# Patient Record
Sex: Female | Born: 1975 | Race: Black or African American | Hispanic: No | Marital: Single | Smoking: Former smoker
Health system: Southern US, Community
[De-identification: ages and names within clinical notes are randomized; demographics above are authoritative.]

## PROBLEM LIST (undated history)

## (undated) DIAGNOSIS — I10 Essential (primary) hypertension: Secondary | ICD-10-CM

## (undated) DIAGNOSIS — R0600 Dyspnea, unspecified: Secondary | ICD-10-CM

## (undated) HISTORY — PX: TRACHEOSTOMY: SUR1362

---

## 2015-01-23 DIAGNOSIS — I10 Essential (primary) hypertension: Secondary | ICD-10-CM | POA: Insufficient documentation

## 2015-01-23 DIAGNOSIS — G4733 Obstructive sleep apnea (adult) (pediatric): Secondary | ICD-10-CM | POA: Diagnosis present

## 2015-06-06 DIAGNOSIS — F1411 Cocaine abuse, in remission: Secondary | ICD-10-CM | POA: Insufficient documentation

## 2015-06-25 DIAGNOSIS — N3946 Mixed incontinence: Secondary | ICD-10-CM | POA: Insufficient documentation

## 2022-01-18 DIAGNOSIS — D649 Anemia, unspecified: Secondary | ICD-10-CM | POA: Insufficient documentation

## 2022-01-18 DIAGNOSIS — G894 Chronic pain syndrome: Secondary | ICD-10-CM | POA: Insufficient documentation

## 2022-03-28 ENCOUNTER — Emergency Department: Payer: Medicaid Other

## 2022-03-28 ENCOUNTER — Other Ambulatory Visit: Payer: Self-pay

## 2022-03-28 ENCOUNTER — Inpatient Hospital Stay
Admission: EM | Admit: 2022-03-28 | Discharge: 2022-03-30 | DRG: 871 | Disposition: A | Discharge status: Home / Self Care | Payer: Medicaid Other | Attending: Internal Medicine | Admitting: Internal Medicine

## 2022-03-28 DIAGNOSIS — Z930 Tracheostomy status: Secondary | ICD-10-CM | POA: Diagnosis not present

## 2022-03-28 DIAGNOSIS — Z9981 Dependence on supplemental oxygen: Secondary | ICD-10-CM

## 2022-03-28 DIAGNOSIS — E876 Hypokalemia: Secondary | ICD-10-CM | POA: Diagnosis not present

## 2022-03-28 DIAGNOSIS — E662 Morbid (severe) obesity with alveolar hypoventilation: Secondary | ICD-10-CM | POA: Diagnosis present

## 2022-03-28 DIAGNOSIS — Z9889 Other specified postprocedural states: Secondary | ICD-10-CM

## 2022-03-28 DIAGNOSIS — A419 Sepsis, unspecified organism: Secondary | ICD-10-CM | POA: Diagnosis present

## 2022-03-28 DIAGNOSIS — R6520 Severe sepsis without septic shock: Secondary | ICD-10-CM | POA: Diagnosis present

## 2022-03-28 DIAGNOSIS — I10 Essential (primary) hypertension: Secondary | ICD-10-CM | POA: Diagnosis present

## 2022-03-28 DIAGNOSIS — L03311 Cellulitis of abdominal wall: Secondary | ICD-10-CM | POA: Diagnosis present

## 2022-03-28 DIAGNOSIS — L0390 Cellulitis, unspecified: Secondary | ICD-10-CM | POA: Diagnosis not present

## 2022-03-28 DIAGNOSIS — Z20822 Contact with and (suspected) exposure to covid-19: Secondary | ICD-10-CM | POA: Diagnosis present

## 2022-03-28 DIAGNOSIS — J9621 Acute and chronic respiratory failure with hypoxia: Secondary | ICD-10-CM | POA: Diagnosis present

## 2022-03-28 DIAGNOSIS — Z6841 Body Mass Index (BMI) 40.0 and over, adult: Secondary | ICD-10-CM

## 2022-03-28 HISTORY — DX: Essential (primary) hypertension: I10

## 2022-03-28 HISTORY — DX: Dyspnea, unspecified: R06.00

## 2022-03-28 LAB — CBC WITH DIFFERENTIAL/PLATELET
Abs Immature Granulocytes: 0.06 10*3/uL (ref 0.00–0.07)
Basophils Absolute: 0 10*3/uL (ref 0.0–0.1)
Basophils Relative: 0 %
Eosinophils Absolute: 0 10*3/uL (ref 0.0–0.5)
Eosinophils Relative: 0 %
HCT: 39.6 % (ref 36.0–46.0)
Hemoglobin: 12.1 g/dL (ref 12.0–15.0)
Immature Granulocytes: 0 %
Lymphocytes Relative: 7 %
Lymphs Abs: 1.1 10*3/uL (ref 0.7–4.0)
MCH: 25.8 pg — ABNORMAL LOW (ref 26.0–34.0)
MCHC: 30.6 g/dL (ref 30.0–36.0)
MCV: 84.4 fL (ref 80.0–100.0)
Monocytes Absolute: 0.6 10*3/uL (ref 0.1–1.0)
Monocytes Relative: 4 %
Neutro Abs: 12.5 10*3/uL — ABNORMAL HIGH (ref 1.7–7.7)
Neutrophils Relative %: 89 %
Platelets: 209 10*3/uL (ref 150–400)
RBC: 4.69 MIL/uL (ref 3.87–5.11)
RDW: 15.7 % — ABNORMAL HIGH (ref 11.5–15.5)
WBC: 14.2 10*3/uL — ABNORMAL HIGH (ref 4.0–10.5)
nRBC: 0 % (ref 0.0–0.2)

## 2022-03-28 LAB — COMPREHENSIVE METABOLIC PANEL
ALT: 13 U/L (ref 0–44)
AST: 20 U/L (ref 15–41)
Albumin: 3.8 g/dL (ref 3.5–5.0)
Alkaline Phosphatase: 59 U/L (ref 38–126)
Anion gap: 8 (ref 5–15)
BUN: 15 mg/dL (ref 6–20)
CO2: 30 mmol/L (ref 22–32)
Calcium: 9.2 mg/dL (ref 8.9–10.3)
Chloride: 100 mmol/L (ref 98–111)
Creatinine, Ser: 1.03 mg/dL — ABNORMAL HIGH (ref 0.44–1.00)
GFR, Estimated: >60 mL/min (ref >60)
Glucose, Bld: 115 mg/dL — ABNORMAL HIGH (ref 70–99)
Potassium: 4.2 mmol/L (ref 3.5–5.1)
Sodium: 138 mmol/L (ref 135–145)
Total Bilirubin: 0.9 mg/dL (ref 0.3–1.2)
Total Protein: 7.5 g/dL (ref 6.5–8.1)

## 2022-03-28 LAB — URINALYSIS, COMPLETE (UACMP) WITH MICROSCOPIC
Bilirubin Urine: NEGATIVE
Glucose, UA: NEGATIVE mg/dL
Ketones, ur: NEGATIVE mg/dL
Leukocytes,Ua: NEGATIVE
Nitrite: NEGATIVE
Protein, ur: 100 mg/dL — AB
Specific Gravity, Urine: 1.013 (ref 1.005–1.030)
pH: 6 (ref 5.0–8.0)

## 2022-03-28 LAB — APTT: aPTT: 28 seconds (ref 24–36)

## 2022-03-28 LAB — LACTIC ACID, PLASMA: Lactic Acid, Venous: 1.6 mmol/L (ref 0.5–1.9)

## 2022-03-28 LAB — GLUCOSE, CAPILLARY: Glucose-Capillary: 131 mg/dL — ABNORMAL HIGH (ref 70–99)

## 2022-03-28 LAB — RESP PANEL BY RT-PCR (FLU A&B, COVID) ARPGX2
Influenza A by PCR: NEGATIVE
Influenza B by PCR: NEGATIVE
SARS Coronavirus 2 by RT PCR: NEGATIVE

## 2022-03-28 LAB — PROTIME-INR
INR: 1 (ref 0.8–1.2)
Prothrombin Time: 13.2 seconds (ref 11.4–15.2)

## 2022-03-28 LAB — POC URINE PREG, ED: Preg Test, Ur: NEGATIVE

## 2022-03-28 LAB — TSH: TSH: 1.448 u[IU]/mL (ref 0.350–4.500)

## 2022-03-28 MED ORDER — SODIUM CHLORIDE 0.9 % IV SOLN
2.0000 g | Freq: Once | INTRAVENOUS | Status: AC
  Administered 2022-03-28: 2 g via INTRAVENOUS
  Filled 2022-03-28: qty 12.5

## 2022-03-28 MED ORDER — ENOXAPARIN SODIUM 100 MG/ML IJ SOSY
85.0000 mg | PREFILLED_SYRINGE | INTRAMUSCULAR | Status: DC
  Administered 2022-03-28: 85 mg via SUBCUTANEOUS
  Filled 2022-03-28 (×3): qty 0.85

## 2022-03-28 MED ORDER — FENTANYL CITRATE PF 50 MCG/ML IJ SOSY
50.0000 ug | PREFILLED_SYRINGE | INTRAMUSCULAR | Status: DC | PRN
Start: 2022-03-28 — End: 2022-03-30
  Administered 2022-03-28: 50 ug via INTRAVENOUS
  Filled 2022-03-28: qty 1

## 2022-03-28 MED ORDER — IOHEXOL 350 MG/ML SOLN
100.0000 mL | Freq: Once | INTRAVENOUS | Status: AC | PRN
  Administered 2022-03-28: 100 mL via INTRAVENOUS

## 2022-03-28 MED ORDER — VANCOMYCIN HCL IN DEXTROSE 1-5 GM/200ML-% IV SOLN
1000.0000 mg | Freq: Once | INTRAVENOUS | Status: AC
  Administered 2022-03-28: 1000 mg via INTRAVENOUS
  Filled 2022-03-28: qty 200

## 2022-03-28 MED ORDER — ACETAMINOPHEN 650 MG RE SUPP: 650.0000 mg | Freq: Four times a day (QID) | RECTAL | Status: DC | PRN

## 2022-03-28 MED ORDER — ACETAMINOPHEN 325 MG PO TABS
650.0000 mg | ORAL_TABLET | Freq: Four times a day (QID) | ORAL | Status: DC | PRN
  Administered 2022-03-28: 650 mg via ORAL
  Filled 2022-03-28: qty 2

## 2022-03-28 MED ORDER — ONDANSETRON HCL 4 MG/2ML IJ SOLN: 4.0000 mg | Freq: Four times a day (QID) | INTRAMUSCULAR | Status: DC | PRN

## 2022-03-28 MED ORDER — VANCOMYCIN HCL 1500 MG/300ML IV SOLN
1500.0000 mg | Freq: Once | INTRAVENOUS | Status: AC
  Administered 2022-03-28: 1500 mg via INTRAVENOUS
  Filled 2022-03-28: qty 300

## 2022-03-28 MED ORDER — SODIUM CHLORIDE 0.9 % IV SOLN
2.0000 g | INTRAVENOUS | Status: DC
  Administered 2022-03-28 – 2022-03-29 (×2): 2 g via INTRAVENOUS
  Filled 2022-03-28 (×3): qty 20

## 2022-03-28 MED ORDER — ONDANSETRON HCL 4 MG PO TABS: 4.0000 mg | ORAL_TABLET | Freq: Four times a day (QID) | ORAL | Status: DC | PRN

## 2022-03-28 MED ORDER — SODIUM CHLORIDE 0.9 % IV SOLN
500.0000 mg | Freq: Once | INTRAVENOUS | Status: AC
  Administered 2022-03-28: 500 mg via INTRAVENOUS
  Filled 2022-03-28: qty 5

## 2022-03-28 MED ORDER — LACTATED RINGERS IV SOLN: INTRAVENOUS | Status: AC

## 2022-03-28 MED ORDER — OXYCODONE-ACETAMINOPHEN 5-325 MG PO TABS
2.0000 | ORAL_TABLET | Freq: Four times a day (QID) | ORAL | Status: DC | PRN
  Administered 2022-03-28 – 2022-03-30 (×6): 2 via ORAL
  Filled 2022-03-28 (×6): qty 2

## 2022-03-28 MED ORDER — HYDROCODONE-ACETAMINOPHEN 5-325 MG PO TABS: 1.0000 | ORAL_TABLET | ORAL | Status: DC | PRN

## 2022-03-28 MED ORDER — LACTATED RINGERS IV SOLN: INTRAVENOUS | Status: DC

## 2022-03-28 MED ORDER — DULOXETINE HCL 30 MG PO CPEP
30.0000 mg | ORAL_CAPSULE | Freq: Every day | ORAL | Status: DC
  Administered 2022-03-29 – 2022-03-30 (×2): 30 mg via ORAL
  Filled 2022-03-28 (×2): qty 1

## 2022-03-28 MED ORDER — LORAZEPAM 2 MG/ML IJ SOLN
0.5000 mg | Freq: Once | INTRAMUSCULAR | Status: AC
  Administered 2022-03-28: 0.5 mg via INTRAVENOUS
  Filled 2022-03-28: qty 1

## 2022-03-28 NOTE — Progress Notes (Signed)
45 yof FC with Shortness of Breath arrives on unit currently on 4L of Oxygen. Alert and oriented by 4. Vital Signs stable, except for Temperature at 101 F. Patient presents with labored breathing and expiratory wheezes. Trache in place ?

## 2022-03-28 NOTE — Assessment & Plan Note (Signed)
Patient presents to the ER for evaluation of shortness of breath and a fever with a Tmax of 103.   ?She is noted to be tachycardic and tachypneic with marked leukocytosis.  Lactic acid level is within normal limits. ?Patient is morbidly obese and has erythema over her pannus consistent with cellulitis ?We will place patient on IV Rocephin ?Follow-up results of blood cultures ? ?

## 2022-03-28 NOTE — Progress Notes (Signed)
CODE SEPSIS - PHARMACY COMMUNICATION ? ?**Broad Spectrum Antibiotics should be administered within 1 hour of Sepsis diagnosis** ? ?Time Code Sepsis Called/Page Received: 1104 ? ?Antibiotics Ordered: Cefepime + azithromycin ? ?Time of 1st antibiotic administration: 1119 ? ?Additional action taken by pharmacy: N/A ? ?Sandra Holloway ?03/28/2022  11:07 AM  ?

## 2022-03-28 NOTE — Assessment & Plan Note (Addendum)
Patient has a history of chronic respiratory failure and is on 3 L of oxygen continuous. ?She was noted to be hypoxic with pulse oximetry of 87% on 3 L and is currently on 5 L via trach collar. ?Worsening respiratory failure may be related to sepsis ?We will attempt to wean patient off oxygen as tolerated ?

## 2022-03-28 NOTE — ED Provider Notes (Signed)
? ?[facility] ?Provider Note ? ? ? Event Date/Time  ? First MD Initiated Contact with Patient 03/28/22 1102   ?  (approximate) ? ? ?History  ? ?Shortness of Breath ? ? ?HPI ? ?Sandra Holloway is a 46 y.o. female morbidly obese on 3 L O2 chronically with trach presents to the ER febrile week cough as well as pain in her abdomen with history of recurrent cellulitis.  No recent antibiotics denies any dysuria.  No flank pain.  No nausea vomiting or diarrhea.  Found to be hypoxic on her chronic O2. ?  ? ? ?Physical Exam  ? ?Triage Vital Signs: ?ED Triage Vitals  ?Enc Vitals Group  ?   BP   ?   Pulse   ?   Resp   ?   Temp   ?   Temp src   ?   SpO2   ?   Weight   ?   Height   ?   Head Circumference   ?   Peak Flow   ?   Pain Score   ?   Pain Loc   ?   Pain Edu?   ?   Excl. in [place]?   ? ? ?Most recent vital signs: ?Vitals:  ? 03/28/22 1226 03/28/22 1342  ?BP:  (!) 112/99  ?Pulse: (!) 103 (!) 103  ?Resp: (!) 36 (!) 23  ?Temp:    ?SpO2: 95% 97%  ? ? ? ?Constitutional: Alert, chronically ill appearing ?Eyes: Conjunctivae are normal.  ?Head: Atraumatic. ?Nose: No congestion/rhinnorhea. ?Mouth/Throat: Mucous membranes are moist.   ?Neck: Painless ROM.  ?Cardiovascular:   Good peripheral circulation. ?Respiratory: Normal respiratory effort.  No retractions.  ?Gastrointestinal: Soft, exam somewhat limited due to body habitus with suprapubic induration and erythema warmth no crepitus ?Musculoskeletal:  no deformity ?Neurologic:  MAE spontaneously. No gross focal neurologic deficits are appreciated.  ?Skin:  Skin is warm, dry and intact. ?Psychiatric: Mood and affect are normal. Speech and behavior are normal. ? ? ? ?ED Results / Procedures / Treatments  ? ?Labs ?(all labs ordered are listed, but only abnormal results are displayed) ?Labs Reviewed  ?COMPREHENSIVE METABOLIC PANEL - Abnormal; Notable for the following components:  ?    Result Value  ? Glucose, Bld 115 (*)   ? Creatinine, Ser 1.03 (*)   ? All other  components within normal limits  ?CBC WITH DIFFERENTIAL/PLATELET - Abnormal; Notable for the following components:  ? WBC 14.2 (*)   ? MCH 25.8 (*)   ? RDW 15.7 (*)   ? Neutro Abs 12.5 (*)   ? All other components within normal limits  ?URINALYSIS, COMPLETE (UACMP) WITH MICROSCOPIC - Abnormal; Notable for the following components:  ? Color, Urine STRAW (*)   ? APPearance CLEAR (*)   ? Hgb urine dipstick SMALL (*)   ? Protein, ur 100 (*)   ? Bacteria, UA RARE (*)   ? All other components within normal limits  ?RESP PANEL BY RT-PCR (FLU A&B, COVID) [id number]  ?CULTURE, BLOOD (ROUTINE X 2)  ?CULTURE, BLOOD (ROUTINE X 2)  ?URINE CULTURE  ?LACTIC ACID, PLASMA  ?PROTIME-INR  ?APTT  ?POC URINE PREG, ED  ? ? ? ?EKG ? ?ED ECG REPORT ?I, Merlyn Lot, the attending physician, personally viewed and interpreted this ECG. ? ? Date: 03/28/2022 ? EKG Time: 14:25 ? Rate: 95 ? Rhythm: sinus ? Axis: normal ? Intervals:normal ? ST&T Change: no stemi, no depressions ? ? ? ?RADIOLOGY ?Please  see ED Course for my review and interpretation. ? ?I personally reviewed all radiographic images ordered to evaluate for the above acute complaints and reviewed radiology reports and findings.  These findings were personally discussed with the patient.  Please see medical record for radiology report. ? ? ? ?PROCEDURES: ? ?Critical Care performed: Yes, see critical care procedure note(s) ? ?.Critical Care ?Performed by: Merlyn Lot, MD ?Authorized by: Merlyn Lot, MD  ? ?Critical care provider statement:  ?  Critical care time (minutes):  40 ?  Critical care was necessary to treat or prevent imminent or life-threatening deterioration of the following conditions:  Sepsis ?  Critical care was time spent personally by me on the following activities:  Ordering and performing treatments and interventions, ordering and review of laboratory studies, ordering and review of radiographic studies, pulse oximetry, re-evaluation of patient's  condition, review of old charts, obtaining history from patient or surrogate, examination of patient, evaluation of patient's response to treatment, discussions with primary provider, discussions with consultants and development of treatment plan with patient or surrogate ? ? ?MEDICATIONS ORDERED IN ED: ?Medications  ?lactated ringers infusion ( Intravenous New Bag/Given 03/28/22 1344)  ?vancomycin (VANCOCIN) IVPB 1000 mg/200 mL premix (has no administration in time range)  ?  Followed by  ?vancomycin (VANCOREADY) IVPB 1500 mg/300 mL (has no administration in time range)  ?fentaNYL (SUBLIMAZE) injection 50 mcg (has no administration in time range)  ?ceFEPIme (MAXIPIME) 2 g in sodium chloride 0.9 % 100 mL IVPB (0 g Intravenous Stopped 03/28/22 1215)  ?azithromycin (ZITHROMAX) 500 mg in sodium chloride 0.9 % 250 mL IVPB (0 mg Intravenous Stopped 03/28/22 1317)  ?iohexol (OMNIPAQUE) 350 MG/ML injection 100 mL (100 mLs Intravenous Contrast Given 03/28/22 1325)  ?LORazepam (ATIVAN) injection 0.5 mg (0.5 mg Intravenous Given 03/28/22 1222)  ? ? ? ?IMPRESSION / MDM / ASSESSMENT AND PLAN / ED COURSE  ?I reviewed the triage vital signs and the nursing notes. ?             ?               ? ?Differential diagnosis includes, but is not limited to, sepsis, pna, chf, cellulitis, appendicit,s uti, pyelo, abscess ? ?Patient presented to ER for evaluation of symptoms as described above.  Patient chronically ill-appearing with acute respiratory failure with hypoxia requiring supplemental oxygen.  Blood work sent off due to concern for sepsis in the differential.  CT imaging ordered as well as x-ray.  The patient will be placed on continuous pulse oximetry and telemetry for monitoring.  Laboratory evaluation will be sent to evaluate for the above complaints.   Order broad-spectrum antibiotics due to concern for sepsis. ? ? ? ?Clinical Course as of 03/28/22 1435  ?Sun Mar 28, 2022  ?1202 Take normal renal function within normal limits.  No  significant acidosis.  Does have leukocytosis with left shift consistent with sepsis.  We will hold off on aggressive IV fluid resuscitation his chest x-ray on my review and interpretation concerning for possible pulmonary edema in the setting of normal lactate and normotensive. [PR]  ?1219 Patient states that she is too scared to go to CT scan requesting something for anxiety.  She is saturating well at this time will order low-dose Ativan. [PR]  ?8 CT imaging does show findings consistent with cellulitis no other acute intra-abdominal process.  Given hypoxia fever tachycardia also suspicious for pneumonia.  She does have sepsis due to she will require hospitalization.  Will consult  hospitalist. [PR]  ?1424 Have consulted with hospitalist who agrees admit patient to their service [PR]  ?  ?Clinical Course User Index ?[PR] Merlyn Lot, MD  ? ? ? ?FINAL CLINICAL IMPRESSION(S) / ED DIAGNOSES  ? ?Final diagnoses:  ?Sepsis with acute hypoxic respiratory failure without septic shock, due to unspecified organism [facility])  ? ? ? ?Rx / DC Orders  ? ?ED Discharge Orders   ? ? None  ? ?  ? ? ? ?Note:  This document was prepared using Dragon voice recognition software and may include unintentional dictation errors. ? ?  ?Merlyn Lot, MD ?03/28/22 1436 ? ?

## 2022-03-28 NOTE — Progress Notes (Signed)
Patient complains of pain 8/10 in lower abdomen. 2 tabs of Percocet 5-325 administered. See MAR. Pain assessment completed. See Flow sheets.  ?

## 2022-03-28 NOTE — Consult Note (Signed)
PHARMACY -  BRIEF ANTIBIOTIC NOTE  ? ?Pharmacy has received consult(s) for cefepime from an ED provider. Patient is also ordered azithromycin. The patient's profile has been reviewed for ht/wt/allergies/indication/available labs.   ? ?One time order(s) placed for  ?--Cefepime 2 g IV ? ?Further antibiotics/pharmacy consults should be ordered by admitting physician if indicated.       ?                ?Thank you, ?Tressie Ellis ?03/28/2022  11:06 AM  ?

## 2022-03-28 NOTE — Progress Notes (Signed)
RT note: ? ?Patient has #6 Shiley XLT Distal Cuffless in place.  Independent with trach care at home and is on 3-4l baseline.  Denies trach needs and this time and refuses suctioning need.  Inner cannula changed at 0900hrs this morning. ? ?Patient states her trach is managed by [facility], and that routine trach changes are done in the OR. ? ?Placed on ATC at 28% for humidity and supplies placed at bedside.  Facility does not support #6 XLT distal, so a 6.0ETT will be placed at bedside for emergency use. ?

## 2022-03-28 NOTE — ED Notes (Signed)
Pt advised she is "scared" to go to CT. Will make MD aware.  ?

## 2022-03-28 NOTE — ED Notes (Signed)
Pt complaint of pain. MD made aware.  ? ?

## 2022-03-28 NOTE — Sepsis Progress Note (Signed)
Sepsis protocol is being followed by [facility]. 

## 2022-03-28 NOTE — ED Triage Notes (Signed)
BIB EMS from home. Called for SOB started yesterday. Chronic 3 liters at home. Sat 87% on 3 liters. EMS placed on 8 liters. ?Abdominal pain. HX of cullulitis,. ?100.8 ?156/121 ?96 ?Pt is a trach patient.  ?

## 2022-03-28 NOTE — Assessment & Plan Note (Signed)
Patient has a BMI of 69.50 kg/m2 ?Complicates overall prognosis and care ?Lifestyle modification and exercise has been discussed with patient in detail ?

## 2022-03-28 NOTE — H&P (Signed)
?History and Physical  ? ? ?Patient: Sandra Holloway MGQ:[id number] DOB: 1976-08-20 ?DOA: 03/28/2022 ?DOS: the patient was seen and examined on 03/28/2022 ?PCP: Pcp, No  ?Patient coming from: Home ? ?Chief Complaint:  ?Chief Complaint  ?Patient presents with  ? Shortness of Breath  ? ?HPI: Sandra Holloway is a 46 y.o. 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 ?Per EMS she had pulse oximetry of 87% on her 3 L and was placed on 8 L prior to transport to the ER.  She had a Tmax of 100.34F ?She denies having any increased secretions from her trach and denies having any cough.  She complains of abdominal pain mostly over the lower abdominal area but denies having any urinary frequency, no nocturia, no dysuria or hematuria. ?She denies having any chest pain, no nausea, no vomiting, no leg swelling, no dizziness, no lightheadedness, no headache, no blurred vision or any focal deficit. ? ?Review of Systems: As mentioned in the history of present illness. All other systems reviewed and are negative. ?Past Medical History:  ?Diagnosis Date  ? Dyspnea   ? Hypertension   ? ? ?Social History:  has no history on file for tobacco use, alcohol use, and drug use. ? ?Not on File ? ?No family history on file. ? ?Prior to Admission medications   ?Not on File  ? ? ?Physical Exam: ?Vitals:  ? 03/28/22 1226 03/28/22 1342 03/28/22 1430 03/28/22 1530  ?BP:  (!) 112/99 (!) 146/85 (!) 144/74  ?Pulse: (!) 103 (!) 103 95 93  ?Resp: (!) 36 (!) 23 (!) 34 (!) 28  ?Temp:      ?TempSrc:      ?SpO2: 95% 97% 93% 91%  ?Weight:      ?Height:      ? ?Physical Exam ?Vitals and nursing note reviewed.  ?Constitutional:   ?   Appearance: She is obese.   ?HENT:  ?   Head: Normocephalic and atraumatic.  ?   Mouth/Throat:  ?   Mouth: Mucous membranes are moist.  ?   Comments: Tracheostomy ?Eyes:  ?   Conjunctiva/sclera: Conjunctivae normal.  ?Cardiovascular:  ?   Rate and Rhythm: Tachycardia present.  ?Pulmonary:  ?   Comments: Tachypnea ?Abdominal:  ?   Comments: Central adiposity, tender over the suprapubic area, erythema over suprapubic area with differential warmth  ?Musculoskeletal:     ?   General: Normal range of motion.  ?   Cervical back: Normal range of motion and neck supple.  ?Skin: ?   General: Skin is warm and dry.  ?Neurological:  ?   General: No focal deficit present.  ?   Mental Status: She is alert and oriented to person, place, and time.  ?Psychiatric:     ?   Mood and Affect: Mood normal.     ?   Behavior: Behavior normal.  ? ? ?Data Reviewed: ?Relevant notes from primary care and specialist visits, past discharge summaries as available in EHR, including Care Everywhere. ?Prior diagnostic testing as pertinent to current admission diagnoses ?Updated medications and problem lists for reconciliation ?ED course, including vitals, labs, imaging, treatment and response to treatment ?Triage notes, nursing and pharmacy notes and ED provider's notes ?Notable results as noted in HPI ?  Labs reviewed.  White count 14.2, hemoglobin 12.1, hematocrit 39.6, MCV 84.4, platelet count 219, sodium 138, potassium 4.2, chloride 100, bicarb 30, glucose 115, BUN 15, creatinine 1.03, lactic acid 1.6, PT 13.2, INR 1.0 ?Urine analysis is sterile ?Respiratory viral panel is negative ?Chest x-ray reviewed by me shows Cardiomegaly with pulmonary vascular congestion and minimal ?bibasilar opacities - question atelectasis versus airspace disease/pneumonia. ?CT scan of abdomen and pelvis shows skin thickening and infiltrative changes in the subcutaneous fat at ?the inferior abdominal wall pannus in the pelvis question cellulitis. Small umbilical hernia containing fat. ?Tiny  nonobstructing LEFT renal calculus. Exophytic 15 mm diameter renal lesion, indeterminate attenuation; ?follow-up nonemergent renal ultrasound recommended to characterize. ?Twelve-lead EKG reviewed by me shows sinus rhythm with nonspecific T wave abnormalities. ?There are no new results to review at this time. ? ?Assessment and Plan: ?* Sepsis due to cellulitis [facility]) ?Patient presents to the ER for evaluation of shortness of breath and a fever with a Tmax of 103.   ?She is noted to be tachycardic and tachypneic with marked leukocytosis.  Lactic acid level is within normal limits. ?Patient is morbidly obese and has erythema over her pannus consistent with cellulitis ?We will place patient on IV Rocephin ?Follow-up results of blood cultures ? ? ?Acute on chronic respiratory failure with hypoxia (HCC) ?Patient has a history of chronic respiratory failure and is on 3 L of oxygen continuous. ?She was noted to be hypoxic with pulse oximetry of 87% on 3 L and is currently on 5 L via trach collar. ?Worsening respiratory failure may be related to sepsis ?We will attempt to wean patient off oxygen as tolerated ? ?Morbid obesity with BMI of 60.0-69.9, adult (HCC) ?Patient has a BMI of 69.50 kg/m2 ?Complicates overall prognosis and care ?Lifestyle modification and exercise has been discussed with patient in detail ? ?H/O tracheostomy ?Status post tracheostomy for obesity hypoventilation syndrome ?Trach care during this hospitalization ? ? ? ? ? Advance Care Planning:   Code Status: Full Code  ? ?Consults: None ? ?Family Communication: Greater than 50% of time was spent discussing patient's condition and plan of care with her at the bedside.  All questions and concerns have been addressed.  She verbalizes understanding and agrees with the plan. ? ?Severity of Illness: ?The appropriate patient status for this patient is INPATIENT. Inpatient status is judged to be reasonable and necessary in order to provide the required intensity  of service to ensure the patient's safety. The patient's presenting symptoms, physical exam findings, and initial radiographic and laboratory data in the context of their chronic comorbidities is felt to place them at high risk for further clinical deterioration. Furthermore, it is not anticipated that the patient will be medically stable for discharge from the hospital within 2 midnights of admission.  ? ?* I certify that at the point of admission it is my clinical judgment that the patient will require inpatient hospital care spanning beyond 2 midnights from the point of admission due to high intensity of service, high risk for further deterioration and high frequency of surveillance required.* ? ?Author: ?Lucile Shutters, MD ?03/28/2022 3:40 PM ? ?For on call review [email or web address].  ?

## 2022-03-28 NOTE — ED Notes (Signed)
Pt ambulated to toilet and back to bed. 

## 2022-03-28 NOTE — Assessment & Plan Note (Signed)
Status post tracheostomy for obesity hypoventilation syndrome ?Trach care during this hospitalization ?

## 2022-03-28 NOTE — Progress Notes (Signed)
Patient arrived at unit.

## 2022-03-28 NOTE — ED Notes (Signed)
Pt ambulated to toilet to urinate and back to bed.  ?

## 2022-03-29 ENCOUNTER — Encounter: Payer: Self-pay | Admitting: Internal Medicine

## 2022-03-29 DIAGNOSIS — Z6841 Body Mass Index (BMI) 40.0 and over, adult: Secondary | ICD-10-CM

## 2022-03-29 DIAGNOSIS — J9621 Acute and chronic respiratory failure with hypoxia: Secondary | ICD-10-CM

## 2022-03-29 LAB — BASIC METABOLIC PANEL
Anion gap: 9 (ref 5–15)
BUN: 13 mg/dL (ref 6–20)
CO2: 29 mmol/L (ref 22–32)
Calcium: 8.7 mg/dL — ABNORMAL LOW (ref 8.9–10.3)
Chloride: 100 mmol/L (ref 98–111)
Creatinine, Ser: 1 mg/dL (ref 0.44–1.00)
GFR, Estimated: >60 mL/min (ref >60)
Glucose, Bld: 106 mg/dL — ABNORMAL HIGH (ref 70–99)
Potassium: 3.4 mmol/L — ABNORMAL LOW (ref 3.5–5.1)
Sodium: 138 mmol/L (ref 135–145)

## 2022-03-29 LAB — CBC
HCT: 37.5 % (ref 36.0–46.0)
Hemoglobin: 11.5 g/dL — ABNORMAL LOW (ref 12.0–15.0)
MCH: 25.7 pg — ABNORMAL LOW (ref 26.0–34.0)
MCHC: 30.7 g/dL (ref 30.0–36.0)
MCV: 83.7 fL (ref 80.0–100.0)
Platelets: 192 10*3/uL (ref 150–400)
RBC: 4.48 MIL/uL (ref 3.87–5.11)
RDW: 15.9 % — ABNORMAL HIGH (ref 11.5–15.5)
WBC: 14.2 10*3/uL — ABNORMAL HIGH (ref 4.0–10.5)
nRBC: 0 % (ref 0.0–0.2)

## 2022-03-29 LAB — HIV ANTIBODY (ROUTINE TESTING W REFLEX): HIV Screen 4th Generation wRfx: NONREACTIVE

## 2022-03-29 MED ORDER — POTASSIUM CHLORIDE CRYS ER 20 MEQ PO TBCR
40.0000 meq | EXTENDED_RELEASE_TABLET | Freq: Once | ORAL | Status: AC
  Administered 2022-03-29: 40 meq via ORAL
  Filled 2022-03-29: qty 2

## 2022-03-29 NOTE — Progress Notes (Addendum)
Progress Note    Sandra Holloway  NGE:[id number] DOB: 09-17-1976  DOA: 03/28/2022 PCP: Pcp, No      Brief Narrative:    Medical records reviewed and are as summarized below:  Sandra Holloway is a 46 y.o. female with medical history significant for morbid obesity, obesity hypoventilation syndrome s/p tracheostomy, chronic hypoxic respiratory failure on 3 L of oxygen, hypertension, asthma, who presented to the hospital because of shortness of breath, oxygen desaturation to 87% on 3 L.  Reportedly, EMS had to put her on 8 L of oxygen prior to coming to the ED.  She was febrile with temperature of 100.8 F in the emergency room.   She was found to have sepsis secondary to lower abdominal wall/ pannus cellulitis.  She was treated with empiric IV antibiotics and IV fluids.      Assessment/Plan:   Principal Problem:   Sepsis due to cellulitis [facility]) Active Problems:   Acute on chronic respiratory failure with hypoxia (HCC)   Morbid obesity with BMI of 60.0-69.9, adult (HCC)   H/O tracheostomy    Body mass index is 69.5 kg/m.  (Morbid obesity)  Sepsis secondary to lower abdominal wall pannus cellulitis: Continue empiric IV Rocephin.  Follow-up blood cultures.  Acute on chronic hypoxic respiratory failure:  She's on 10 L/min oxygen. Taper down oxygen as able.  She uses 3 L at baseline.  Hypokalemia: Replete potassium and monitor levels  S/p tracheostomy for OHS  Diet Order             Diet Heart Room service appropriate? Yes; Fluid consistency: Thin  Diet effective now                            Consultants: None  Procedures: None    Medications:    DULoxetine  30 mg Oral Daily   enoxaparin (LOVENOX) injection  85 mg Subcutaneous Q24H   Continuous Infusions:  cefTRIAXone (ROCEPHIN)  IV Stopped (03/28/22 2319)     Anti-infectives (From admission, onward)    Start     Dose/Rate Route Frequency Ordered Stop   03/28/22 2200  cefTRIAXone  (ROCEPHIN) 2 g in sodium chloride 0.9 % 100 mL IVPB        2 g 200 mL/hr over 30 Minutes Intravenous Every 24 hours 03/28/22 1458 04/04/22 2159   03/28/22 1630  vancomycin (VANCOREADY) IVPB 1500 mg/300 mL       See Hyperspace for full Linked Orders Report.   1,500 mg 150 mL/hr over 120 Minutes Intravenous  Once 03/28/22 1409 03/28/22 2014   03/28/22 1500  vancomycin (VANCOCIN) IVPB 1000 mg/200 mL premix       See Hyperspace for full Linked Orders Report.   1,000 mg 200 mL/hr over 60 Minutes Intravenous  Once 03/28/22 1409 03/28/22 1613   03/28/22 1115  ceFEPIme (MAXIPIME) 2 g in sodium chloride 0.9 % 100 mL IVPB        2 g 200 mL/hr over 30 Minutes Intravenous  Once 03/28/22 1104 03/28/22 1215   03/28/22 1115  azithromycin (ZITHROMAX) 500 mg in sodium chloride 0.9 % 250 mL IVPB        500 mg 250 mL/hr over 60 Minutes Intravenous  Once 03/28/22 1104 03/28/22 1317              Family Communication/Anticipated D/C date and plan/Code Status   DVT prophylaxis:      Code Status: Full Code  Family  Communication: None Disposition Plan: Plan to discharge home in 1 to 2 days   Status is: Inpatient Remains inpatient appropriate because: IV antibiotics for lower abdominal cellulitis       Subjective:   Interval events noted.  She complains of lower abdominal pain  Objective:    Vitals:   03/29/22 0830 03/29/22 0835 03/29/22 0847 03/29/22 1135  BP:   (!) 166/86 140/75  Pulse:   79 76  Resp:   18 18  Temp:   99 F (37.2 C) 98.6 F (37 C)  TempSrc:      SpO2: (!) 74% 94% 94% 98%  Weight:      Height:       No data found.   Intake/Output Summary (Last 24 hours) at 03/29/2022 1321 Last data filed at 03/29/2022 0604 Gross per 24 hour  Intake 400 ml  Output --  Net 400 ml   Filed Weights   03/28/22 1104  Weight: (!) 172.4 kg    Exam:  GEN: NAD SKIN: Warm and dry EYES: EOMI ENT: MMM, tracheostomy CV: RRR PULM: CTA B ABD: soft, obese with large pannus,  tenderness across the lower abdomen, no rebound tenderness or guarding, +BS CNS: AAO x 3, non focal EXT: No edema or tenderness        Data Reviewed:   I have personally reviewed following labs and imaging studies:  Labs: Labs show the following:   Basic Metabolic Panel: Recent Labs  Lab 03/28/22 1109 03/29/22 0410  NA 138 138  K 4.2 3.4*  CL 100 100  CO2 30 29  GLUCOSE 115* 106*  BUN 15 13  CREATININE 1.03* 1.00  CALCIUM 9.2 8.7*   GFR Estimated Creatinine Clearance: 111 mL/min (by C-G formula based on SCr of 1 mg/dL). Liver Function Tests: Recent Labs  Lab 03/28/22 1109  AST 20  ALT 13  ALKPHOS 59  BILITOT 0.9  PROT 7.5  ALBUMIN 3.8   No results for input(s): LIPASE, AMYLASE in the last 168 hours. No results for input(s): AMMONIA in the last 168 hours. Coagulation profile Recent Labs  Lab 03/28/22 1109  INR 1.0    CBC: Recent Labs  Lab 03/28/22 1109 03/29/22 0410  WBC 14.2* 14.2*  NEUTROABS 12.5*  --   HGB 12.1 11.5*  HCT 39.6 37.5  MCV 84.4 83.7  PLT 209 192   Cardiac Enzymes: No results for input(s): CKTOTAL, CKMB, CKMBINDEX, TROPONINI in the last 168 hours. BNP (last 3 results) No results for input(s): PROBNP in the last 8760 hours. CBG: Recent Labs  Lab 03/28/22 2244  GLUCAP 131*   D-Dimer: No results for input(s): DDIMER in the last 72 hours. Hgb A1c: No results for input(s): HGBA1C in the last 72 hours. Lipid Profile: No results for input(s): CHOL, HDL, LDLCALC, TRIG, CHOLHDL, LDLDIRECT in the last 72 hours. Thyroid function studies: Recent Labs    03/28/22 1109  TSH 1.448   Anemia work up: No results for input(s): VITAMINB12, FOLATE, FERRITIN, TIBC, IRON, RETICCTPCT in the last 72 hours. Sepsis Labs: Recent Labs  Lab 03/28/22 1109 03/29/22 0410  WBC 14.2* 14.2*  LATICACIDVEN 1.6  --     Microbiology Recent Results (from the past 240 hour(s))  Resp Panel by RT-PCR (Flu A&B, Covid) Nasopharyngeal Swab      Status: None   Collection Time: 03/28/22 11:09 AM   Specimen: Nasopharyngeal Swab; Nasopharyngeal(NP) swabs in vial transport medium  Result Value Ref Range Status   SARS Coronavirus 2 by RT  PCR NEGATIVE NEGATIVE Final    Comment: (NOTE) SARS-CoV-2 target nucleic acids are NOT DETECTED.  The SARS-CoV-2 RNA is generally detectable in upper respiratory specimens during the acute phase of infection. The lowest concentration of SARS-CoV-2 viral copies this assay can detect is 138 copies/mL. A negative result does not preclude SARS-Cov-2 infection and should not be used as the sole basis for treatment or other patient management decisions. A negative result may occur with  improper specimen collection/handling, submission of specimen other than nasopharyngeal swab, presence of viral mutation(s) within the areas targeted by this assay, and inadequate number of viral copies(<138 copies/mL). A negative result must be combined with clinical observations, patient history, and epidemiological information. The expected result is Negative.  Fact Sheet for Patients:  BloggerCourse.com  Fact Sheet for Healthcare Providers:  SeriousBroker.it  This test is no t yet approved or cleared by the [place] FDA and  has been authorized for detection and/or diagnosis of SARS-CoV-2 by FDA under an Emergency Use Authorization (EUA). This EUA will remain  in effect (meaning this test can be used) for the duration of the COVID-19 declaration under Section 564(b)(1) of the Act, 21 U.S.C.section 360bbb-3(b)(1), unless the authorization is terminated  or revoked sooner.       Influenza A by PCR NEGATIVE NEGATIVE Final   Influenza B by PCR NEGATIVE NEGATIVE Final    Comment: (NOTE) The Xpert Xpress SARS-CoV-2/FLU/RSV plus assay is intended as an aid in the diagnosis of influenza from Nasopharyngeal swab specimens and should not be used as a sole basis  for treatment. Nasal washings and aspirates are unacceptable for Xpert Xpress SARS-CoV-2/FLU/RSV testing.  Fact Sheet for Patients: BloggerCourse.com  Fact Sheet for Healthcare Providers: SeriousBroker.it  This test is not yet approved or cleared by the [place] FDA and has been authorized for detection and/or diagnosis of SARS-CoV-2 by FDA under an Emergency Use Authorization (EUA). This EUA will remain in effect (meaning this test can be used) for the duration of the COVID-19 declaration under Section 564(b)(1) of the Act, 21 U.S.C. section 360bbb-3(b)(1), unless the authorization is terminated or revoked.  Performed at [facility], [address] [place], [place] [address]   Blood Culture (routine x 2)     Status: None (Preliminary result)   Collection Time: 03/28/22 11:09 AM   Specimen: BLOOD  Result Value Ref Range Status   Specimen Description BLOOD LEFT ANTECUBITAL  Final   Special Requests   Final    BOTTLES DRAWN AEROBIC AND ANAEROBIC Blood Culture adequate volume   Culture   Final    NO GROWTH < 24 HOURS Performed at [facility], [address], [place], [place] [address]    Report Status PENDING  Incomplete  Blood Culture (routine x 2)     Status: None (Preliminary result)   Collection Time: 03/28/22 11:09 AM   Specimen: BLOOD  Result Value Ref Range Status   Specimen Description BLOOD RIGHT ANTECUBITAL  Final   Special Requests   Final    BOTTLES DRAWN AEROBIC AND ANAEROBIC Blood Culture results may not be optimal due to an inadequate volume of blood received in culture bottles   Culture   Final    NO GROWTH < 24 HOURS Performed at [facility], [address], [place], [place] [address]    Report Status PENDING  Incomplete    Procedures and diagnostic studies:  CT CHEST WO CONTRAST  Result Date: 03/28/2022 CLINICAL DATA:  Pneumonia, suspected complication EXAM:  CT  CHEST WITHOUT CONTRAST TECHNIQUE: Multidetector CT imaging of the chest was performed following the standard protocol without IV contrast. RADIATION DOSE REDUCTION: This exam was performed according to the departmental dose-optimization program which includes automated exposure control, adjustment of the mA and/or kV according to patient size and/or use of iterative reconstruction technique. COMPARISON:  Chest radiograph 03/28/2022 FINDINGS: Cardiovascular: Upper normal size of cardiac chambers. Atherosclerotic calcifications aorta. Trace pericardial fluid. Aorta normal caliber. Mediastinum/Nodes: Tracheostomy tube within trachea above carina. Esophagus unremarkable. Base of cervical region normal appearance. Scattered normal size mediastinal lymph nodes. No thoracic adenopathy. Lungs/Pleura: Dependent opacities in the lower lobes favoring atelectasis. No definite infiltrate, pleural effusion, or pneumothorax. Upper Abdomen: Excreted contrast material within renal collecting systems. Indeterminate exophytic lesion posterior RIGHT kidney is noted on CT abdomen same date remaining visualized upper abdomen unremarkable. Musculoskeletal: No acute osseous findings. IMPRESSION: Bibasilar atelectasis. No other significant intrathoracic abnormalities. Electronically Signed   By: Ulyses Southward M.D.   On: 03/28/2022 15:28   CT ABDOMEN PELVIS W CONTRAST  Result Date: 03/28/2022 CLINICAL DATA:  Sepsis, shortness of breath since yesterday, abdominal pain, history of cellulitis EXAM: CT ABDOMEN AND PELVIS WITH CONTRAST TECHNIQUE: Multidetector CT imaging of the abdomen and pelvis was performed using the standard protocol following bolus administration of intravenous contrast. RADIATION DOSE REDUCTION: This exam was performed according to the departmental dose-optimization program which includes automated exposure control, adjustment of the mA and/or kV according to patient size and/or use of iterative reconstruction technique.  CONTRAST:  OMNIPAQUE IOHEXOL 350 MG/ML SOLN IV. No oral contrast. COMPARISON:  None FINDINGS: Lower chest: Bibasilar atelectasis Hepatobiliary: Gallbladder and liver normal appearance Pancreas: Normal appearance Spleen: Normal appearance Adrenals/Urinary Tract: Adrenal glands normal appearance. Tiny nonobstructing calculus at inferior pole LEFT kidney. Exophytic renal lesion 15 mm diameter, indeterminate attenuation. Kidneys, ureters, and bladder otherwise normal appearance. Stomach/Bowel: Normal appendix. Stomach and bowel loops normal appearance. Vascular/Lymphatic: Aorta normal caliber. Atherosclerotic calcifications in iliac arteries. No adenopathy. Reproductive: Unremarkable uterus and ovaries Other: No free air or free fluid. No acute inflammatory process. Small umbilical hernia containing fat. Musculoskeletal: Osseous structures unremarkable. Skin thickening and infiltrative changes in the subcutaneous fat at the inferior abdominal wall pannus in the pelvis question cellulitis. IMPRESSION: Skin thickening and infiltrative changes in the subcutaneous fat at the inferior abdominal wall pannus in the pelvis question cellulitis. Small umbilical hernia containing fat. Tiny nonobstructing LEFT renal calculus. Exophytic 15 mm diameter renal lesion, indeterminate attenuation; follow-up nonemergent renal ultrasound recommended to characterize. Electronically Signed   By: Ulyses Southward M.D.   On: 03/28/2022 13:50   DG Chest Port 1 View  Result Date: 03/28/2022 CLINICAL DATA:  Shortness of breath and sepsis. EXAM: PORTABLE CHEST 1 VIEW COMPARISON:  None. FINDINGS: A tracheostomy tube is identified. Cardiomegaly with pulmonary vascular congestion noted. Minimal bibasilar opacities are identified. No pneumothorax, pleural effusion or acute bony abnormalities are noted. IMPRESSION: Cardiomegaly with pulmonary vascular congestion and minimal bibasilar opacities - question atelectasis versus airspace  disease/pneumonia. Electronically Signed   By: Harmon Pier M.D.   On: 03/28/2022 11:48               LOS: 1 day   Sandra Holloway  [facility]   Pager on [email or web address]. If 7PM-7AM, please contact night-coverage at [email or web address]     03/29/2022, 1:21 PM

## 2022-03-29 NOTE — Progress Notes (Signed)
Mobility Specialist - Progress Note ? ? ? 03/29/22 1344  ?Mobility  ?Activity Refused mobility  ? ? ?Pt sitting EOB upon arrival. Pt refuses session despite max encouragement. No reason specified. ? ?Clarisa Schools ?Mobility Specialist ?03/29/22, 1:52 PM ? ? ? ?

## 2022-03-29 NOTE — Progress Notes (Signed)
Patient is a 69 yof FC who comes to the unit, alert and oriented by 4, Vital signs stable, currently on 4L of Oxygen. Patient has a tracheostomy collar on. NSR on the monitor. She is morbidly obese. ? ?General assessment shows a distended abdomen, and tenderness to the Right Lower Quadrant. The skin here appears mottled, and is discolored. ? ?20 G Peripheral IV present in the Left antecubital. ? ?Generalized edema present. ?

## 2022-03-29 NOTE — Progress Notes (Signed)
Patient's temperature on arrival to the unit was 99.5. Current temp is 98.1 ?

## 2022-03-29 NOTE — Plan of Care (Signed)
?  Problem: Skin Integrity: ?Goal: Skin integrity will improve ?Outcome: Progressing ? ?Problem: Nutrition: ?Goal: Adequate nutrition will be maintained ?Outcome: Progressing ?  ?Problem: Pain Managment: ?Goal: General experience of comfort will improve ?Outcome: Progressing ?  ?Problem: Clinical Measurements: ?Goal: Respiratory complications will improve ?Outcome: Progressing ?  ?Problem: Clinical Measurements: ?Goal: Will remain free from infection ?Outcome: Progressing ?  ?  ?

## 2022-03-30 LAB — BASIC METABOLIC PANEL
Anion gap: 6 (ref 5–15)
BUN: 13 mg/dL (ref 6–20)
CO2: 29 mmol/L (ref 22–32)
Calcium: 9 mg/dL (ref 8.9–10.3)
Chloride: 104 mmol/L (ref 98–111)
Creatinine, Ser: 0.85 mg/dL (ref 0.44–1.00)
GFR, Estimated: >60 mL/min (ref >60)
Glucose, Bld: 133 mg/dL — ABNORMAL HIGH (ref 70–99)
Potassium: 4.4 mmol/L (ref 3.5–5.1)
Sodium: 139 mmol/L (ref 135–145)

## 2022-03-30 LAB — CBC
HCT: 38.2 % (ref 36.0–46.0)
Hemoglobin: 11.5 g/dL — ABNORMAL LOW (ref 12.0–15.0)
MCH: 25.7 pg — ABNORMAL LOW (ref 26.0–34.0)
MCHC: 30.1 g/dL (ref 30.0–36.0)
MCV: 85.3 fL (ref 80.0–100.0)
Platelets: 201 10*3/uL (ref 150–400)
RBC: 4.48 MIL/uL (ref 3.87–5.11)
RDW: 15.8 % — ABNORMAL HIGH (ref 11.5–15.5)
WBC: 9.5 10*3/uL (ref 4.0–10.5)
nRBC: 0 % (ref 0.0–0.2)

## 2022-03-30 LAB — URINE CULTURE

## 2022-03-30 MED ORDER — CEFADROXIL 500 MG PO CAPS
500.0000 mg | ORAL_CAPSULE | Freq: Two times a day (BID) | ORAL | 0 refills | Status: DC
Start: 2022-03-30 — End: 2022-10-21

## 2022-03-30 MED ORDER — ACETAMINOPHEN 325 MG PO TABS
650.0000 mg | ORAL_TABLET | Freq: Four times a day (QID) | ORAL | Status: AC | PRN
Start: 2022-03-30 — End: ?

## 2022-03-30 NOTE — Discharge Summary (Signed)
?Physician Discharge Summary ?  ?Patient: Sandra Holloway MRN: [id number] DOB: 1975-12-17  ?Admit date:     03/28/2022  ?Discharge date: 03/30/2022  ?Discharge Physician: Lurene Shadow  ? ?PCP: Pcp, No  ? ?Recommendations at discharge:  ? ? ?Follow-up with PCP in 1 week ? ?Discharge Diagnoses: ?Principal Problem: ?  Sepsis due to cellulitis [facility]) ?Active Problems: ?  Acute on chronic respiratory failure with hypoxia (HCC) ?  Morbid obesity with BMI of 60.0-69.9, adult (HCC) ?  H/O tracheostomy ? ?Resolved Problems: ?  * No resolved hospital problems. * ? ?Hospital Course: ? ?Ms. Sandra Holloway is a 46 y.o. female with medical history significant for morbid obesity, obesity hypoventilation syndrome s/p tracheostomy, chronic hypoxic respiratory failure on 3 L of oxygen, hypertension, asthma, who presented to the hospital because of shortness of breath, oxygen desaturation to 87% on 3 L.  Reportedly, EMS had to put her on 8 L of oxygen prior to coming to the ED.  She was febrile with temperature of 100.8 ?F in the emergency room. ?  ?  ?She was found to have sepsis secondary to lower abdominal wall/ pannus cellulitis.  She was treated with empiric IV antibiotics and IV fluids.  She was also treated with oxygen via trach collar.  She said she is on 3 to 4 L/min oxygen via trach collar but she only uses it with activity.  However, it was discovered that patient will need oxygen even at rest.  She has been advised to use 4 L/min oxygen around-the-clock.  She is feeling better and she wants to go home today on oral antibiotics. ? ? ? ? ? ? ?  ? ?Pain control - [employer] Controlled Substance Reporting System database was reviewed. and patient was instructed, not to drive, operate heavy machinery, perform activities at heights, swimming or participation in water activities or provide baby-sitting services while on Pain, Sleep and Anxiety Medications; until their outpatient Physician has advised to do so again. Also  recommended to not to take more than prescribed Pain, Sleep and Anxiety Medications.  ?Consultants: None ?Procedures performed: None ?Disposition: Home ?Diet recommendation:  ?Discharge Diet Orders (From admission, onward)  ? ?  Start     Ordered  ? 03/30/22 0000  Diet - low sodium heart healthy       ? 03/30/22 1511  ? ?  ?  ? ?  ? ?Cardiac diet ?DISCHARGE MEDICATION: ?Allergies as of 03/30/2022   ?No Known Allergies ?  ? ?  ?Medication List  ?  ? ?TAKE these medications   ? ?acetaminophen 325 MG tablet ?Commonly known as: TYLENOL ?Take 2 tablets (650 mg total) by mouth every 6 (six) hours as needed for mild pain (or Fever >/= 101). ?  ?cefadroxil 500 MG capsule ?Commonly known as: DURICEF ?Take 1 capsule (500 mg total) by mouth 2 (two) times daily. ?  ?DULoxetine 30 MG capsule ?Commonly known as: CYMBALTA ?Take 30 mg by mouth daily. ?  ?oxyCODONE-acetaminophen 10-325 MG tablet ?Commonly known as: PERCOCET ?Take 1 tablet by mouth every 6 (six) hours. ?  ? ?  ? ?  ?  ? ? ?  ?Durable Medical Equipment  ?(From admission, onward)  ?  ? ? ?  ? ?  Start     Ordered  ? 03/30/22 1512  DME Oxygen  Once       ?Question Answer Comment  ?Length of Need Lifetime   ?Mode or (Route) Mask   ?Liters per Minute 4   ?  Frequency Continuous (stationary and portable oxygen unit needed)   ?Oxygen conserving device Yes   ?Oxygen delivery system Gas   ?  ? 03/30/22 1511  ? ?  ?  ? ?  ? ? ?Discharge Exam: ?Filed Weights  ? 03/28/22 1104  ?Weight: (!) 172.4 kg  ? ?GEN: NAD ?SKIN: Warm and dry ?EYES: No pallor or icterus ?ENT: MMM ?CV: RRR ?PULM: CTA B ?ABD: soft, obese with large pannus, some induration and tenderness noted along the pannus in the lower abdomen, no rebound tenderness or guarding, +BS ?CNS: AAO x 3, non focal ?EXT: No edema or tenderness ? ? ?Condition at discharge: good ? ?The results of significant diagnostics from this hospitalization (including imaging, microbiology, ancillary and laboratory) are listed below for reference.   ? ?Imaging Studies: ?CT CHEST WO CONTRAST ? ?Result Date: 03/28/2022 ?CLINICAL DATA:  Pneumonia, suspected complication EXAM: CT CHEST WITHOUT CONTRAST TECHNIQUE: Multidetector CT imaging of the chest was performed following the standard protocol without IV contrast. RADIATION DOSE REDUCTION: This exam was performed according to the departmental dose-optimization program which includes automated exposure control, adjustment of the mA and/or kV according to patient size and/or use of iterative reconstruction technique. COMPARISON:  Chest radiograph 03/28/2022 FINDINGS: Cardiovascular: Upper normal size of cardiac chambers. Atherosclerotic calcifications aorta. Trace pericardial fluid. Aorta normal caliber. Mediastinum/Nodes: Tracheostomy tube within trachea above carina. Esophagus unremarkable. Base of cervical region normal appearance. Scattered normal size mediastinal lymph nodes. No thoracic adenopathy. Lungs/Pleura: Dependent opacities in the lower lobes favoring atelectasis. No definite infiltrate, pleural effusion, or pneumothorax. Upper Abdomen: Excreted contrast material within renal collecting systems. Indeterminate exophytic lesion posterior RIGHT kidney is noted on CT abdomen same date remaining visualized upper abdomen unremarkable. Musculoskeletal: No acute osseous findings. IMPRESSION: Bibasilar atelectasis. No other significant intrathoracic abnormalities. Electronically Signed   By: Ulyses SouthwardMark  Boles M.D.   On: 03/28/2022 15:28  ? ?CT ABDOMEN PELVIS W CONTRAST ? ?Result Date: 03/28/2022 ?CLINICAL DATA:  Sepsis, shortness of breath since yesterday, abdominal pain, history of cellulitis EXAM: CT ABDOMEN AND PELVIS WITH CONTRAST TECHNIQUE: Multidetector CT imaging of the abdomen and pelvis was performed using the standard protocol following bolus administration of intravenous contrast. RADIATION DOSE REDUCTION: This exam was performed according to the departmental dose-optimization program which includes  automated exposure control, adjustment of the mA and/or kV according to patient size and/or use of iterative reconstruction technique. CONTRAST:  100mL OMNIPAQUE IOHEXOL 350 MG/ML SOLN IV. No oral contrast. COMPARISON:  None FINDINGS: Lower chest: Bibasilar atelectasis Hepatobiliary: Gallbladder and liver normal appearance Pancreas: Normal appearance Spleen: Normal appearance Adrenals/Urinary Tract: Adrenal glands normal appearance. Tiny nonobstructing calculus at inferior pole LEFT kidney. Exophytic renal lesion 15 mm diameter, indeterminate attenuation. Kidneys, ureters, and bladder otherwise normal appearance. Stomach/Bowel: Normal appendix. Stomach and bowel loops normal appearance. Vascular/Lymphatic: Aorta normal caliber. Atherosclerotic calcifications in iliac arteries. No adenopathy. Reproductive: Unremarkable uterus and ovaries Other: No free air or free fluid. No acute inflammatory process. Small umbilical hernia containing fat. Musculoskeletal: Osseous structures unremarkable. Skin thickening and infiltrative changes in the subcutaneous fat at the inferior abdominal wall pannus in the pelvis question cellulitis. IMPRESSION: Skin thickening and infiltrative changes in the subcutaneous fat at the inferior abdominal wall pannus in the pelvis question cellulitis. Small umbilical hernia containing fat. Tiny nonobstructing LEFT renal calculus. Exophytic 15 mm diameter renal lesion, indeterminate attenuation; follow-up nonemergent renal ultrasound recommended to characterize. Electronically Signed   By: Ulyses SouthwardMark  Boles M.D.   On: 03/28/2022 13:50  ? ?DG Chest  Port 1 View ? ?Result Date: 03/28/2022 ?CLINICAL DATA:  Shortness of breath and sepsis. EXAM: PORTABLE CHEST 1 VIEW COMPARISON:  None. FINDINGS: A tracheostomy tube is identified. Cardiomegaly with pulmonary vascular congestion noted. Minimal bibasilar opacities are identified. No pneumothorax, pleural effusion or acute bony abnormalities are noted. IMPRESSION:  Cardiomegaly with pulmonary vascular congestion and minimal bibasilar opacities - question atelectasis versus airspace disease/pneumonia. Electronically Signed   By: Harmon Pier M.D.   On: 03/28/2022 11:48   ? ?Micr

## 2022-03-30 NOTE — TOC Initial Note (Signed)
Transition of Care (TOC) - Initial/Assessment Note  ? ? ?Patient Details  ?Name: Sandra Holloway ?MRN: [id number] ?Date of Birth: 09-23-1976 ? ?Transition of Care (TOC) CM/SW Contact:    ?Gildardo Griffes, LCSW ?Phone Number: ?03/30/2022, 2:23 PM ? ?Clinical Narrative:                 ? ?CSW notes patient gets trach supplies from [facility] and self manages, per patient report she gets home O2 through [employer] and is interested in portable tank. CSW has informed Ian Malkin with [employer]  He reports he will order and deliver to room.  ? ?No further needs identified.  ? ? ?Expected Discharge Plan: Home/Self Care ?Barriers to Discharge: Continued Medical Work up ? ? ?Patient Goals and CMS Choice ?Patient states their goals for this hospitalization and ongoing recovery are:: to go home ?CMS Medicare.gov Compare Post Acute Care list provided to:: Patient ?Choice offered to / list presented to : Patient ? ?Expected Discharge Plan and Services ?Expected Discharge Plan: Home/Self Care ?  ?  ?  ?Living arrangements for the past 2 months: Single Family Home ?                ?  ?DME Agency: [employer] ?Date DME Agency Contacted: 03/30/22 ?  ?  ?  ?  ?  ?  ?  ? ?Prior Living Arrangements/Services ?Living arrangements for the past 2 months: Single Family Home ?Lives with:: Self ?  ?       ?  ?  ?  ?  ? ?Activities of Daily Living ?  ?  ? ?Permission Sought/Granted ?  ?  ?   ?   ?   ?   ? ?Emotional Assessment ?  ?  ?  ?Orientation: : Oriented to Self, Oriented to Place, Oriented to  Time, Oriented to Situation ?Alcohol / Substance Use: Not Applicable ?Psych Involvement: No (comment) ? ?Admission diagnosis:  Sepsis (HCC) [A41.9] ?Sepsis with acute hypoxic respiratory failure without septic shock, due to unspecified organism (HCC) [A41.9, R65.20, J96.01] ?Patient Active Problem List  ? Diagnosis Date Noted  ? Sepsis due to cellulitis (HCC) 03/28/2022  ? Morbid obesity with BMI of 60.0-69.9, adult (HCC) 03/28/2022  ? Acute on chronic respiratory failure  with hypoxia (HCC) 03/28/2022  ? H/O tracheostomy 03/28/2022  ? ?PCP:  Pcp, No ?Pharmacy:   ?[facility] - [place], [place] - [address] [place] ?[address] [place] [address] ?Phone: [phone] Fax: [phone] ? ? ? ? ?Social Determinants of Health (SDOH) Interventions ?  ? ?Readmission Risk Interventions ?   ? View : No data to display.  ?  ?  ?  ? ? ? ?

## 2022-04-02 LAB — CULTURE, BLOOD (ROUTINE X 2)
Culture: NO GROWTH
Culture: NO GROWTH
Special Requests: ADEQUATE

## 2022-05-31 ENCOUNTER — Emergency Department: Payer: Medicaid Other

## 2022-05-31 ENCOUNTER — Encounter: Payer: Self-pay | Admitting: Family Medicine

## 2022-05-31 ENCOUNTER — Other Ambulatory Visit: Payer: Self-pay

## 2022-05-31 ENCOUNTER — Observation Stay
Admission: EM | Admit: 2022-05-31 | Discharge: 2022-05-31 | Disposition: A | Discharge status: Home / Self Care | Payer: Medicaid Other | Attending: Family Medicine | Admitting: Family Medicine

## 2022-05-31 DIAGNOSIS — A419 Sepsis, unspecified organism: Secondary | ICD-10-CM | POA: Diagnosis present

## 2022-05-31 DIAGNOSIS — J45901 Unspecified asthma with (acute) exacerbation: Secondary | ICD-10-CM | POA: Diagnosis present

## 2022-05-31 DIAGNOSIS — Z930 Tracheostomy status: Secondary | ICD-10-CM | POA: Insufficient documentation

## 2022-05-31 DIAGNOSIS — Z20822 Contact with and (suspected) exposure to covid-19: Secondary | ICD-10-CM | POA: Insufficient documentation

## 2022-05-31 DIAGNOSIS — K219 Gastro-esophageal reflux disease without esophagitis: Secondary | ICD-10-CM

## 2022-05-31 DIAGNOSIS — R0603 Acute respiratory distress: Secondary | ICD-10-CM | POA: Diagnosis present

## 2022-05-31 DIAGNOSIS — E662 Morbid (severe) obesity with alveolar hypoventilation: Secondary | ICD-10-CM | POA: Diagnosis not present

## 2022-05-31 DIAGNOSIS — J9621 Acute and chronic respiratory failure with hypoxia: Secondary | ICD-10-CM | POA: Diagnosis not present

## 2022-05-31 DIAGNOSIS — Z79899 Other long term (current) drug therapy: Secondary | ICD-10-CM | POA: Diagnosis not present

## 2022-05-31 DIAGNOSIS — L03311 Cellulitis of abdominal wall: Secondary | ICD-10-CM | POA: Insufficient documentation

## 2022-05-31 DIAGNOSIS — Z6841 Body Mass Index (BMI) 40.0 and over, adult: Secondary | ICD-10-CM | POA: Diagnosis not present

## 2022-05-31 DIAGNOSIS — Z9981 Dependence on supplemental oxygen: Principal | ICD-10-CM | POA: Insufficient documentation

## 2022-05-31 DIAGNOSIS — I10 Essential (primary) hypertension: Secondary | ICD-10-CM | POA: Diagnosis not present

## 2022-05-31 DIAGNOSIS — Z9889 Other specified postprocedural states: Secondary | ICD-10-CM

## 2022-05-31 DIAGNOSIS — J9611 Chronic respiratory failure with hypoxia: Secondary | ICD-10-CM | POA: Diagnosis not present

## 2022-05-31 LAB — CBC WITH DIFFERENTIAL/PLATELET
Abs Immature Granulocytes: 0.07 10*3/uL (ref 0.00–0.07)
Basophils Absolute: 0 10*3/uL (ref 0.0–0.1)
Basophils Relative: 0 %
Eosinophils Absolute: 0.1 10*3/uL (ref 0.0–0.5)
Eosinophils Relative: 0 %
HCT: 44.7 % (ref 36.0–46.0)
Hemoglobin: 13.4 g/dL (ref 12.0–15.0)
Immature Granulocytes: 1 %
Lymphocytes Relative: 10 %
Lymphs Abs: 1.4 10*3/uL (ref 0.7–4.0)
MCH: 25.7 pg — ABNORMAL LOW (ref 26.0–34.0)
MCHC: 30 g/dL (ref 30.0–36.0)
MCV: 85.8 fL (ref 80.0–100.0)
Monocytes Absolute: 0.5 10*3/uL (ref 0.1–1.0)
Monocytes Relative: 4 %
Neutro Abs: 12.5 10*3/uL — ABNORMAL HIGH (ref 1.7–7.7)
Neutrophils Relative %: 85 %
Platelets: 195 10*3/uL (ref 150–400)
RBC: 5.21 MIL/uL — ABNORMAL HIGH (ref 3.87–5.11)
RDW: 16.3 % — ABNORMAL HIGH (ref 11.5–15.5)
WBC: 14.6 10*3/uL — ABNORMAL HIGH (ref 4.0–10.5)
nRBC: 0 % (ref 0.0–0.2)

## 2022-05-31 LAB — COMPREHENSIVE METABOLIC PANEL
ALT: 14 U/L (ref 0–44)
AST: 25 U/L (ref 15–41)
Albumin: 3.8 g/dL (ref 3.5–5.0)
Alkaline Phosphatase: 68 U/L (ref 38–126)
Anion gap: 4 — ABNORMAL LOW (ref 5–15)
BUN: 15 mg/dL (ref 6–20)
CO2: 30 mmol/L (ref 22–32)
Calcium: 9.3 mg/dL (ref 8.9–10.3)
Chloride: 103 mmol/L (ref 98–111)
Creatinine, Ser: 1.13 mg/dL — ABNORMAL HIGH (ref 0.44–1.00)
GFR, Estimated: >60 mL/min (ref >60)
Glucose, Bld: 118 mg/dL — ABNORMAL HIGH (ref 70–99)
Potassium: 4.3 mmol/L (ref 3.5–5.1)
Sodium: 137 mmol/L (ref 135–145)
Total Bilirubin: 0.7 mg/dL (ref 0.3–1.2)
Total Protein: 8 g/dL (ref 6.5–8.1)

## 2022-05-31 LAB — POC URINE PREG, ED: Preg Test, Ur: NEGATIVE

## 2022-05-31 LAB — URINALYSIS, COMPLETE (UACMP) WITH MICROSCOPIC
Bilirubin Urine: NEGATIVE
Glucose, UA: NEGATIVE mg/dL
Ketones, ur: NEGATIVE mg/dL
Leukocytes,Ua: NEGATIVE
Nitrite: NEGATIVE
Protein, ur: 100 mg/dL — AB
Specific Gravity, Urine: 1.015 (ref 1.005–1.030)
pH: 5 (ref 5.0–8.0)

## 2022-05-31 LAB — PROTIME-INR
INR: 1.1 (ref 0.8–1.2)
Prothrombin Time: 14.2 s (ref 11.4–15.2)

## 2022-05-31 LAB — BLOOD GAS, VENOUS
Acid-Base Excess: 4.9 mmol/L — ABNORMAL HIGH (ref 0.0–2.0)
Bicarbonate: 32.7 mmol/L — ABNORMAL HIGH (ref 20.0–28.0)
FIO2: 35 %
O2 Saturation: 93.1 %
Patient temperature: 37
pCO2, Ven: 62 mmHg — ABNORMAL HIGH (ref 44–60)
pH, Ven: 7.33 (ref 7.25–7.43)
pO2, Ven: 70 mmHg — ABNORMAL HIGH (ref 32–45)

## 2022-05-31 LAB — LACTIC ACID, PLASMA
Lactic Acid, Venous: 2.2 mmol/L (ref 0.5–1.9)
Lactic Acid, Venous: 1.2 mmol/L (ref 0.5–1.9)

## 2022-05-31 LAB — SARS CORONAVIRUS 2 BY RT PCR: SARS Coronavirus 2 by RT PCR: NEGATIVE

## 2022-05-31 LAB — BRAIN NATRIURETIC PEPTIDE: B Natriuretic Peptide: 19.7 pg/mL (ref 0.0–100.0)

## 2022-05-31 LAB — APTT: aPTT: 27 s (ref 24–36)

## 2022-05-31 LAB — PROCALCITONIN: Procalcitonin: 0.46 ng/mL

## 2022-05-31 LAB — HCG, QUANTITATIVE, PREGNANCY: hCG, Beta Chain, Quant, S: 1 m[IU]/mL (ref <5)

## 2022-05-31 MED ORDER — LACTATED RINGERS IV BOLUS
1000.0000 mL | Freq: Once | INTRAVENOUS | Status: AC
  Administered 2022-05-31: 1000 mL via INTRAVENOUS

## 2022-05-31 MED ORDER — IPRATROPIUM-ALBUTEROL 0.5-2.5 (3) MG/3ML IN SOLN
3.0000 mL | Freq: Four times a day (QID) | RESPIRATORY_TRACT | Status: DC
  Administered 2022-05-31: 3 mL via RESPIRATORY_TRACT
  Filled 2022-05-31: qty 3

## 2022-05-31 MED ORDER — LACTATED RINGERS IV BOLUS: 1000.0000 mL | Freq: Once | INTRAVENOUS | Status: DC

## 2022-05-31 MED ORDER — GUAIFENESIN ER 600 MG PO TB12
600.0000 mg | ORAL_TABLET | Freq: Two times a day (BID) | ORAL | Status: DC
  Administered 2022-05-31: 600 mg via ORAL
  Filled 2022-05-31: qty 1

## 2022-05-31 MED ORDER — VANCOMYCIN HCL IN DEXTROSE 1-5 GM/200ML-% IV SOLN
1000.0000 mg | Freq: Once | INTRAVENOUS | Status: DC
  Filled 2022-05-31: qty 200

## 2022-05-31 MED ORDER — ONDANSETRON HCL 4 MG PO TABS: 4.0000 mg | ORAL_TABLET | Freq: Four times a day (QID) | ORAL | Status: DC | PRN

## 2022-05-31 MED ORDER — IPRATROPIUM-ALBUTEROL 0.5-2.5 (3) MG/3ML IN SOLN
3.0000 mL | Freq: Once | RESPIRATORY_TRACT | Status: AC
  Administered 2022-05-31: 3 mL via RESPIRATORY_TRACT
  Filled 2022-05-31: qty 3

## 2022-05-31 MED ORDER — SODIUM CHLORIDE 0.9 % IV SOLN: INTRAVENOUS | Status: DC

## 2022-05-31 MED ORDER — TRAZODONE HCL 50 MG PO TABS: 25.0000 mg | ORAL_TABLET | Freq: Every evening | ORAL | Status: DC | PRN

## 2022-05-31 MED ORDER — ONDANSETRON HCL 4 MG/2ML IJ SOLN: 4.0000 mg | Freq: Four times a day (QID) | INTRAMUSCULAR | Status: DC | PRN

## 2022-05-31 MED ORDER — OXYCODONE-ACETAMINOPHEN 10-325 MG PO TABS: 1.0000 | ORAL_TABLET | Freq: Four times a day (QID) | ORAL | Status: DC

## 2022-05-31 MED ORDER — VANCOMYCIN HCL 1.25 G IV SOLR
1250.0000 mg | Freq: Once | INTRAVENOUS | Status: DC
  Filled 2022-05-31: qty 25

## 2022-05-31 MED ORDER — IBUPROFEN 400 MG PO TABS
400.0000 mg | ORAL_TABLET | Freq: Once | ORAL | Status: AC
  Administered 2022-05-31: 400 mg via ORAL
  Filled 2022-05-31: qty 1

## 2022-05-31 MED ORDER — ENOXAPARIN SODIUM 100 MG/ML IJ SOSY
0.5000 mg/kg | PREFILLED_SYRINGE | INTRAMUSCULAR | Status: DC
  Filled 2022-05-31: qty 0.88

## 2022-05-31 MED ORDER — METHYLPREDNISOLONE SODIUM SUCC 40 MG IJ SOLR: 40.0000 mg | Freq: Three times a day (TID) | INTRAMUSCULAR | Status: DC

## 2022-05-31 MED ORDER — FENTANYL CITRATE PF 50 MCG/ML IJ SOSY
50.0000 ug | PREFILLED_SYRINGE | Freq: Once | INTRAMUSCULAR | Status: AC
  Administered 2022-05-31: 50 ug via INTRAVENOUS
  Filled 2022-05-31: qty 1

## 2022-05-31 MED ORDER — OXYCODONE-ACETAMINOPHEN 5-325 MG PO TABS
1.0000 | ORAL_TABLET | Freq: Four times a day (QID) | ORAL | Status: DC | PRN
  Administered 2022-05-31: 1 via ORAL
  Filled 2022-05-31: qty 1

## 2022-05-31 MED ORDER — MAGNESIUM HYDROXIDE 400 MG/5ML PO SUSP
30.0000 mL | Freq: Every day | ORAL | Status: DC | PRN
Start: 2022-05-31 — End: 2022-05-31

## 2022-05-31 MED ORDER — VANCOMYCIN HCL IN DEXTROSE 1-5 GM/200ML-% IV SOLN
1000.0000 mg | Freq: Once | INTRAVENOUS | Status: AC
Start: 2022-05-31 — End: 2022-05-31
  Administered 2022-05-31: 1000 mg via INTRAVENOUS

## 2022-05-31 MED ORDER — LACTATED RINGERS IV SOLN: INTRAVENOUS | Status: DC

## 2022-05-31 MED ORDER — METHYLPREDNISOLONE SODIUM SUCC 125 MG IJ SOLR
125.0000 mg | Freq: Once | INTRAMUSCULAR | Status: AC
  Administered 2022-05-31: 125 mg via INTRAVENOUS
  Filled 2022-05-31: qty 2

## 2022-05-31 MED ORDER — AZITHROMYCIN 500 MG PO TABS
500.0000 mg | ORAL_TABLET | Freq: Every day | ORAL | Status: AC
  Administered 2022-05-31: 500 mg via ORAL
  Filled 2022-05-31: qty 1

## 2022-05-31 MED ORDER — ACETAMINOPHEN 500 MG PO TABS
1000.0000 mg | ORAL_TABLET | Freq: Four times a day (QID) | ORAL | Status: DC | PRN
  Administered 2022-05-31: 1000 mg via ORAL
  Filled 2022-05-31: qty 2

## 2022-05-31 MED ORDER — ADULT MULTIVITAMIN W/MINERALS CH
1.0000 | ORAL_TABLET | Freq: Every day | ORAL | Status: DC
Start: 2022-05-31 — End: 2022-05-31
  Administered 2022-05-31: 1 via ORAL
  Filled 2022-05-31: qty 1

## 2022-05-31 MED ORDER — CEFEPIME HCL 2 G IV SOLR
2.0000 g | Freq: Once | INTRAVENOUS | Status: AC
  Administered 2022-05-31: 2 g via INTRAVENOUS
  Filled 2022-05-31: qty 12.5

## 2022-05-31 MED ORDER — MORPHINE SULFATE (PF) 4 MG/ML IV SOLN
4.0000 mg | Freq: Once | INTRAVENOUS | Status: AC
  Administered 2022-05-31: 4 mg via INTRAVENOUS
  Filled 2022-05-31: qty 1

## 2022-05-31 MED ORDER — FUROSEMIDE 40 MG PO TABS
40.0000 mg | ORAL_TABLET | Freq: Every day | ORAL | Status: DC
  Administered 2022-05-31: 40 mg via ORAL
  Filled 2022-05-31: qty 1

## 2022-05-31 MED ORDER — VANCOMYCIN HCL IN DEXTROSE 1-5 GM/200ML-% IV SOLN
1000.0000 mg | Freq: Once | INTRAVENOUS | Status: AC
Start: 2022-05-31 — End: 2022-05-31
  Administered 2022-05-31: 1000 mg via INTRAVENOUS
  Filled 2022-05-31: qty 200

## 2022-05-31 MED ORDER — LORATADINE 10 MG PO TABS
10.0000 mg | ORAL_TABLET | Freq: Every day | ORAL | Status: DC
  Administered 2022-05-31: 10 mg via ORAL
  Filled 2022-05-31: qty 1

## 2022-05-31 MED ORDER — IOHEXOL 350 MG/ML SOLN
100.0000 mL | Freq: Once | INTRAVENOUS | Status: AC | PRN
  Administered 2022-05-31: 100 mL via INTRAVENOUS

## 2022-05-31 MED ORDER — DULOXETINE HCL 30 MG PO CPEP
30.0000 mg | ORAL_CAPSULE | Freq: Every day | ORAL | Status: DC
  Administered 2022-05-31: 30 mg via ORAL
  Filled 2022-05-31: qty 1

## 2022-05-31 MED ORDER — ACETAMINOPHEN 325 MG RE SUPP: 650.0000 mg | Freq: Four times a day (QID) | RECTAL | Status: DC | PRN

## 2022-05-31 MED ORDER — SODIUM CHLORIDE 0.9 % IV SOLN: 2.0000 g | INTRAVENOUS | Status: DC

## 2022-05-31 MED ORDER — AZITHROMYCIN 250 MG PO TABS: 250.0000 mg | ORAL_TABLET | Freq: Every day | ORAL | Status: DC

## 2022-05-31 MED ORDER — HYDROCOD POLI-CHLORPHE POLI ER 10-8 MG/5ML PO SUER: 5.0000 mL | Freq: Two times a day (BID) | ORAL | Status: DC | PRN

## 2022-05-31 MED ORDER — PANTOPRAZOLE SODIUM 40 MG PO TBEC
40.0000 mg | DELAYED_RELEASE_TABLET | Freq: Every day | ORAL | Status: DC
  Administered 2022-05-31: 40 mg via ORAL
  Filled 2022-05-31: qty 1

## 2022-05-31 MED ORDER — ACETAMINOPHEN 325 MG PO TABS: 650.0000 mg | ORAL_TABLET | Freq: Four times a day (QID) | ORAL | Status: DC | PRN

## 2022-05-31 MED ORDER — METRONIDAZOLE 500 MG/100ML IV SOLN
500.0000 mg | Freq: Once | INTRAVENOUS | Status: AC
Start: 2022-05-31 — End: 2022-05-31
  Administered 2022-05-31: 500 mg via INTRAVENOUS
  Filled 2022-05-31: qty 100

## 2022-05-31 NOTE — Assessment & Plan Note (Signed)
-   We will continue PPI therapy 

## 2022-05-31 NOTE — ED Notes (Signed)
Pt given phone to call for ride.

## 2022-05-31 NOTE — Progress Notes (Signed)
PHARMACIST - PHYSICIAN COMMUNICATION  CONCERNING:  Enoxaparin (Lovenox) for DVT Prophylaxis    RECOMMENDATION: Patient was prescribed enoxaprin 40mg  q24 hours for VTE prophylaxis.   Filed Weights   05/31/22 0046  Weight: (!) 175.1 kg (386 lb)    Body mass index is 70.6 kg/m.  Estimated Creatinine Clearance: 98.3 mL/min (A) (by C-G formula based on SCr of 1.13 mg/dL (H)).   Based on [facility] policy patient is candidate for enoxaparin 0.5mg /kg TBW SQ every 24 hours based on BMI being >30.  DESCRIPTION: Pharmacy has adjusted enoxaparin dose per [facility] policy.  Patient is now receiving enoxaparin 0.5 mg/kg every 24 hours   [facility], PharmD, [facility] 05/31/2022 4:59 AM

## 2022-05-31 NOTE — ED Notes (Signed)
Resp changed O2 to 35% equivalent to pt 3L home O2 per EDP Z Smith request.

## 2022-05-31 NOTE — Assessment & Plan Note (Signed)
-   The patient be admitted to a PCU bed. - We will continue steroid therapy with IV Solu-Medrol. - Continue bronchodilator therapy with DuoNebs 4 times daily and every 4 hours as needed. - O2 protocol will be followed.

## 2022-05-31 NOTE — Assessment & Plan Note (Signed)
-   The patient will be placed on IV Rocephin. - We will hydrate with IV normal saline. - We will follow blood cultures.

## 2022-05-31 NOTE — ED Notes (Signed)
Pt provided with inpatient bed, pericare, clean gown and linens at this time by this RN and [place].

## 2022-05-31 NOTE — Assessment & Plan Note (Signed)
-   Tracheostomy care will be pursued.

## 2022-05-31 NOTE — Progress Notes (Signed)
CODE SEPSIS - PHARMACY COMMUNICATION  **Broad Spectrum Antibiotics should be administered within 1 hour of Sepsis diagnosis**  Time Code Sepsis Called/Page Received: 6812  Antibiotics Ordered: Cefepime, Flagyl, Vancomycin  Time of 1st antibiotic administration: 0106  Otelia Sergeant, PharmD, MBA 05/31/2022 1:08 AM

## 2022-05-31 NOTE — ED Notes (Signed)
Admission MD notified of loss of IV access and no staff available on IV team for [facility].

## 2022-05-31 NOTE — Progress Notes (Signed)
PHARMACY -  BRIEF ANTIBIOTIC NOTE   Pharmacy has received consult(s) for Cefepime and Vancomycin from an ED provider.  The patient's profile has been reviewed for ht/wt/allergies/indication/available labs.    One time order(s) placed for Cefepime 2 gm & Vancomycin 2 gm per pt wt > 100 kg & pregnancy status unknown at this time.  Further antibiotics/pharmacy consults should be ordered by admitting physician if indicated.                       Thank you, Otelia Sergeant, PharmD, MBA 05/31/2022 1:10 AM

## 2022-05-31 NOTE — ED Notes (Signed)
Lactic 2.2 per lab.  EDP Z Smith notified.

## 2022-05-31 NOTE — Assessment & Plan Note (Signed)
-   O2 protocol will be followed. 

## 2022-05-31 NOTE — Progress Notes (Signed)
Report received from Glo Herring ED RN

## 2022-05-31 NOTE — ED Notes (Signed)
Pt refused 20 pathogen respiratory panel swab. Lab called to see if panel can be added to previous swab

## 2022-05-31 NOTE — ED Provider Notes (Signed)
[facility] Provider Note    Event Date/Time   First MD Initiated Contact with Patient 05/31/22 0033     (approximate)   History   Respiratory Distress   HPI  Sandra Holloway is a 46 y.o. female  with medical history significant for morbid obesity, obesity hypoventilation syndrome s/p tracheostomy, chronic hypoxic respiratory failure on 3 L of oxygen, hypertension, asthma and recent admission for acute on chronic hypoxic respiratory failure and sepsis from abdominal pannus cellulitis discharged on 5/2 presents via EMS from home for evaluation of 1 day of worsening abdominal pain and some shortness of breath and cough.  She states he has chronic abdominal pain in her legs is 90 different than usual.  No vomiting, diarrhea, urinary symptoms, chest pain, headache earache or sore throat.  No hemoptysis.  No other acute concerns at this time.  She is a 45 by EMS on room air with sats 98% and does not self suctioning prior to arrival which did help with her shortness of breath.      Physical Exam  Triage Vital Signs: ED Triage Vitals  Enc Vitals Group     BP      Pulse      Resp      Temp      Temp src      SpO2      Weight      Height      Head Circumference      Peak Flow      Pain Score      Pain Loc      Pain Edu?      Excl. in GC?     Most recent vital signs: Vitals:   05/31/22 0130 05/31/22 0150  BP: (!) 156/88   Pulse: (!) 115   Resp: (!) 32   Temp:  (!) 103 F (39.4 C)  SpO2: 94%     General: Awake comfortable appearing. CV:  Good peripheral perfusion.  Tachycardic. Resp:  Tachypneic with increased effort and some index.  Wheezing and rhonchi bilaterally. Abd:  No distention.  Tender throughout with induration erythema lower quadrants. Other:     ED Results / Procedures / Treatments  Labs (all labs ordered are listed, but only abnormal results are displayed) Labs Reviewed  LACTIC ACID, PLASMA - Abnormal; Notable for the  following components:      Result Value   Lactic Acid, Venous 2.2 (*)    All other components within normal limits  COMPREHENSIVE METABOLIC PANEL - Abnormal; Notable for the following components:   Glucose, Bld 118 (*)    Creatinine, Ser 1.13 (*)    Anion gap 4 (*)    All other components within normal limits  CBC WITH DIFFERENTIAL/PLATELET - Abnormal; Notable for the following components:   WBC 14.6 (*)    RBC 5.21 (*)    MCH 25.7 (*)    RDW 16.3 (*)    Neutro Abs 12.5 (*)    All other components within normal limits  CULTURE, BLOOD (ROUTINE X 2)  CULTURE, BLOOD (ROUTINE X 2)  URINE CULTURE  SARS CORONAVIRUS 2 BY RT PCR  PROTIME-INR  APTT  PROCALCITONIN  BRAIN NATRIURETIC PEPTIDE  HCG, QUANTITATIVE, PREGNANCY  LACTIC ACID, PLASMA  URINALYSIS, COMPLETE (UACMP) WITH MICROSCOPIC  BLOOD GAS, VENOUS  POC URINE PREG, ED     EKG  Fuhs significant artifact throughout with otherwise unremarkable intervals and appear to be normal axis.  Some nonspecific changes noted  in inferior and anterior leads.   RADIOLOGY  Chest x-ray my interpretation showed cardiomegaly and what appears to be some congestion without clear focal consolidation pneumothorax or large effusion.  CT chest abdomen pelvis on my interpretation not evidence of a large PE, focal consolidation, diverticulitis, kidney stone or clear evidence of pneumonia.  There appears to be some atelectasis but no significant edema in the lungs and there is some stranding in anterior abdominal wall consistent with cellulitis.  I reviewed radiology interpretation and agree with these findings in addition to a nonobstructing left renal stone and exophytic right renal lesion without other acute thoracic abdominal or pelvic process.   PROCEDURES:  Critical Care performed: Yes, see critical care procedure note(s)  .Critical Care  Performed by: Gilles Chiquito, MD Authorized by: Gilles Chiquito, MD   Critical care provider  statement:    Critical care time (minutes):  90   Critical care was necessary to treat or prevent imminent or life-threatening deterioration of the following conditions:  Sepsis and respiratory failure   Critical care was time spent personally by me on the following activities:  Development of treatment plan with patient or surrogate, discussions with consultants, evaluation of patient's response to treatment, examination of patient, ordering and review of laboratory studies, ordering and review of radiographic studies, ordering and performing treatments and interventions, pulse oximetry, re-evaluation of patient's condition and review of old charts   MEDICATIONS ORDERED IN ED: Medications  acetaminophen (TYLENOL) tablet 1,000 mg (1,000 mg Oral Given 05/31/22 0054)  vancomycin (VANCOCIN) IVPB 1000 mg/200 mL premix (1,000 mg Intravenous New Bag/Given 05/31/22 0141)    Followed by  vancomycin (VANCOCIN) IVPB 1000 mg/200 mL premix (has no administration in time range)  ipratropium-albuterol (DUONEB) 0.5-2.5 (3) MG/3ML nebulizer solution 3 mL (has no administration in time range)  methylPREDNISolone sodium succinate (SOLU-MEDROL) 125 mg/2 mL injection 125 mg (has no administration in time range)  lactated ringers bolus 1,000 mL (has no administration in time range)  ceFEPIme (MAXIPIME) 2 g in sodium chloride 0.9 % 100 mL IVPB (0 g Intravenous Stopped 05/31/22 0137)  metroNIDAZOLE (FLAGYL) IVPB 500 mg (500 mg Intravenous New Bag/Given 05/31/22 0142)  fentaNYL (SUBLIMAZE) injection 50 mcg (50 mcg Intravenous Given 05/31/22 0055)  iohexol (OMNIPAQUE) 350 MG/ML injection 100 mL (100 mLs Intravenous Contrast Given 05/31/22 0228)  ibuprofen (ADVIL) tablet 400 mg (400 mg Oral Given 05/31/22 0206)     IMPRESSION / MDM / ASSESSMENT AND PLAN / ED COURSE  I reviewed the triage vital signs and the nursing notes. Patient's presentation is most consistent with acute presentation with potential threat to life or bodily  function.                               Differential diagnosis includes, but is not limited to, shortness of breath increased oxygen requirement given she is quite 8 L via trach mask on arrival secondary to pneumonia versus COPD exacerbation versus PE versus CHF in addition to what appears to be recurrent abdominal cellulitis is seen on exam with concerns for possible deeper space infection in the abdomen.  On arrival patient is tachycardic tachypneic and febrile.  Chest x-ray my interpretation showed cardiomegaly and what appears to be some congestion without clear focal consolidation pneumothorax or large effusion.  CT chest abdomen pelvis on my interpretation not evidence of a large PE, focal consolidation, diverticulitis, kidney stone or clear evidence of pneumonia.  There  appears to be some atelectasis but no significant edema in the lungs and there is some stranding in anterior abdominal wall consistent with cellulitis.  I reviewed radiology interpretation and agree with these findings in addition to a nonobstructing left renal stone and exophytic right renal lesion without other acute thoracic abdominal or pelvic process.  CMP is remarkable for creatinine of 1.13 compared to 0.52 months ago without other significant lecture light or metabolic derangements.  [place] 2.2.  CBC with WBC count of 14.6, hemoglobin of 13.4 and normal platelets.  INR is 1.1.  PTT is 27.  Procalcitonin 0.46.  BNP is 19.7.  hCG is negative.  I suspect presentation today is related to an asthma exacerbation in the setting of sepsis from recurrent abdominal wall cellulitis.  Patient started on broad-spectrum antibiotics.  We will also treat with his asthma exacerbation with DuoNebs and steroids.  I will admit to medicine service for further evaluation and management.     FINAL CLINICAL IMPRESSION(S) / ED DIAGNOSES   Final diagnoses:  Sepsis, due to unspecified organism, unspecified whether acute organ dysfunction  present (HCC)  Cellulitis of abdominal wall  Exacerbation of asthma, unspecified asthma severity, unspecified whether persistent     Rx / DC Orders   ED Discharge Orders     None        Note:  This document was prepared using Dragon voice recognition software and may include unintentional dictation errors.   Gilles Chiquito, MD 05/31/22 [phone]

## 2022-05-31 NOTE — Progress Notes (Signed)
Admission profile updated. ?

## 2022-05-31 NOTE — Progress Notes (Addendum)
IVT consulted for difficult IV placement.  RN Herbert Seta advised there is currently no IV Team staff on campus.  Advised if a line is obtained, please clear consult.Chat to primary RN, Tresa Endo.

## 2022-05-31 NOTE — ED Notes (Addendum)
RN and RT at bedside. Clear fluids on floor. IV had came out of pt's arm when pt turned over and NS running on floor. Bedside commode place beside bed. Floor cleaned. IV team consult placed

## 2022-05-31 NOTE — ED Triage Notes (Signed)
Pt arrives from home via [facility].  Called out d/t resp distress O2 sat 88 RA, RR 50  Pt has trach and self suctioned prior to calling.Fever of 103.5 per EMS.  PT A&Ox4 at this time, pt states that [facility] has improved at this time.

## 2022-05-31 NOTE — ED Notes (Signed)
Pt c/o bilateral chronic leg pain. MD messaged.

## 2022-05-31 NOTE — Progress Notes (Signed)
Code Sepsis tracking by eLINK 

## 2022-05-31 NOTE — H&P (Signed)
Home     [place]   PATIENT NAME: Sandra Holloway    MR#:  [id number]  DATE OF BIRTH:  Jan 08, 1976  DATE OF ADMISSION:  05/31/2022  PRIMARY CARE PHYSICIAN: Myles Lipps, MD   Patient is coming from: Home  REQUESTING/REFERRING PHYSICIAN: Hulan Saas, MD  CHIEF COMPLAINT:   Chief Complaint  Patient presents with   Respiratory Distress    HISTORY OF PRESENT ILLNESS:  Sandra Holloway is a 46 y.o. female with medical history significant for asthma, GERD, morbid obesity, obstructive sleep apnea status post tracheostomy, chronic respiratory failure on home O2 at 3 L/min and hypertension who presented to emergency room with acute onset of shortness of breath with cough productive of yellowish sputum and wheezing worsening since yesterday as well as associated worsening abdominal pain.  No fever or chills.  No nausea or vomiting.  She has been having lower abdominal pain, swelling with induration and erythema, warmth and tenderness.  No chest pain or palpitations.  No dysuria, oliguria or hematuria or flank pain.  ED Course: When she came to the ER vital signs revealed fever 102.2 with tachycardia 114 BP was 155/83 and pulse symmetry was 96 and sent on 50% FiO2 on T collar.  BMP was within normal as well as LFTs.  Lactic acid was 2.2 and CBC showed leukocytosis of 14.6 with neutrophilia.  Procalcitonin 0.46 serum pregnancy test was negative.  Blood culture was drawn. EKG as reviewed by me : EKG showed sinus tachycardia with rate of 108 with multiple PVCs Imaging: Chest x-ray showed stable cardiomegaly with moderate/severe pulmonary vascular congestion.  The patient was given IV cefepime, Flagyl and vancomycin, 50 mcg of IV fentanyl and 400 mg of p.o. ibuprofen, DuoNeb, IV Solu-Medrol and 4 mg of IV morphine sulfate.  She will be admitted to a progressive unit bed for further evaluation and management. PAST MEDICAL HISTORY:   Past Medical History:  Diagnosis Date   Dyspnea     Hypertension     PAST SURGICAL HISTORY:   Past Surgical History:  Procedure Laterality Date   TRACHEOSTOMY      SOCIAL HISTORY:   Social History   Tobacco Use   Smoking status: Unknown   Smokeless tobacco: Not on file  Substance Use Topics   Alcohol use: Not Currently    FAMILY HISTORY:  No family history on file.  DRUG ALLERGIES:  No Known Allergies  REVIEW OF SYSTEMS:   ROS As per history of present illness. All pertinent systems were reviewed above. Constitutional, HEENT, cardiovascular, respiratory, GI, GU, musculoskeletal, neuro, psychiatric, endocrine, integumentary and hematologic systems were reviewed and are otherwise negative/unremarkable except for positive findings mentioned above in the HPI.   MEDICATIONS AT HOME:   Prior to Admission medications   Medication Sig Start Date End Date Taking? Authorizing Provider  DULoxetine (CYMBALTA) 30 MG capsule Take 30 mg by mouth daily. 03/17/22  Yes [provider]  furosemide (LASIX) 40 MG tablet Take 40 mg by mouth daily.   Yes [provider]  loratadine (CLARITIN) 10 MG tablet Take 10 mg by mouth daily.   Yes [provider]  Multiple Vitamin (MULTIVITAMIN WITH MINERALS) TABS tablet Take 1 tablet by mouth daily.   Yes [provider]  omeprazole (PRILOSEC) 20 MG capsule Take 20 mg by mouth daily.   Yes [provider]  acetaminophen (TYLENOL) 325 MG tablet Take 2 tablets (650 mg total) by mouth every 6 (six) hours as needed for mild pain (or  Fever >/= 101). 03/30/22   Lurene Shadow, MD  cefadroxil (DURICEF) 500 MG capsule Take 1 capsule (500 mg total) by mouth 2 (two) times daily. Patient not taking: Reported on 05/31/2022 03/30/22   Lurene Shadow, MD  oxyCODONE-acetaminophen (PERCOCET) 10-325 MG tablet Take 1 tablet by mouth every 6 (six) hours. Patient not taking: Reported on 05/31/2022 03/17/22   [provider]      VITAL SIGNS:  Blood pressure 139/84, pulse 89,  temperature 98.4 F (36.9 C), temperature source Oral, resp. rate 19, height 5\' 2"  (1.575 m), weight (!) 175.1 kg, SpO2 94 %.  PHYSICAL EXAMINATION:  Physical Exam  GENERAL:  46 y.o.-year-old patient lying in the bed with no acute distress however with conversational dyspnea EYES: Pupils equal, round, reactive to light and accommodation. No scleral icterus. Extraocular muscles intact.  HEENT: Head atraumatic, normocephalic. Oropharynx and nasopharynx clear.  Tracheostomy in place. NECK:  Supple, no jugular venous distention. No thyroid enlargement, no tenderness.  LUNGS: Diminished expiratory airflow with scattered expiratory wheezes and harsh vesicular breathing.  No use of accessory muscles of respiration.  CARDIOVASCULAR: Regular rate and rhythm, S1, S2 normal. No murmurs, rubs, or gallops.  ABDOMEN: Soft, nondistended, nontender. Bowel sounds present. No organomegaly or mass.  EXTREMITIES: Trace bilateral lower extremity pitting edema with no cyanosis, or clubbing.  NEUROLOGIC: Cranial nerves II through XII are intact. Muscle strength 5/5 in all extremities. Sensation intact. Gait not checked.  PSYCHIATRIC: The patient is alert and oriented x 3.  Normal affect and good eye contact. SKIN: No obvious rash, lesion, or ulcer.   LABORATORY PANEL:   CBC Recent Labs  Lab 05/31/22 0034  WBC 14.6*  HGB 13.4  HCT 44.7  PLT 195   ------------------------------------------------------------------------------------------------------------------  Chemistries  Recent Labs  Lab 05/31/22 0034  NA 137  K 4.3  CL 103  CO2 30  GLUCOSE 118*  BUN 15  CREATININE 1.13*  CALCIUM 9.3  AST 25  ALT 14  ALKPHOS 68  BILITOT 0.7   ------------------------------------------------------------------------------------------------------------------  Cardiac Enzymes No results for input(s): "TROPONINI" in the last 168  hours. ------------------------------------------------------------------------------------------------------------------  RADIOLOGY:  CT CHEST ABDOMEN PELVIS W CONTRAST  Result Date: 05/31/2022 CLINICAL DATA:  Respiratory distress and fevers EXAM: CT CHEST, ABDOMEN, AND PELVIS WITH CONTRAST TECHNIQUE: Multidetector CT imaging of the chest, abdomen and pelvis was performed following the standard protocol during bolus administration of intravenous contrast. RADIATION DOSE REDUCTION: This exam was performed according to the departmental dose-optimization program which includes automated exposure control, adjustment of the mA and/or kV according to patient size and/or use of iterative reconstruction technique. CONTRAST:  08/01/2022 OMNIPAQUE IOHEXOL 350 MG/ML SOLN COMPARISON:  03/29/2019 FINDINGS: CT CHEST FINDINGS Cardiovascular: Thoracic aorta shows no aneurysmal dilatation. No central pulmonary emboli are seen. Cardiomegaly is noted. No significant coronary calcifications are noted. Mediastinum/Nodes: Thoracic inlet demonstrates evidence of a tracheostomy tube in satisfactory position. No hilar or mediastinal adenopathy is noted. The esophagus is within normal limits. Lungs/Pleura: Mild bibasilar atelectatic changes are noted. No focal infiltrate or significant edema is noted. No pulmonary nodules are seen. Musculoskeletal: Degenerative changes of the thoracic spine are seen. No acute rib abnormality is noted. CT ABDOMEN PELVIS FINDINGS Hepatobiliary: No focal liver abnormality is seen. No gallstones, gallbladder wall thickening, or biliary dilatation. Pancreas: Unremarkable. No pancreatic ductal dilatation or surrounding inflammatory changes. Spleen: Normal in size without focal abnormality. Adrenals/Urinary Tract: Adrenal glands are within normal limits. Kidneys are well visualized bilaterally. Tiny nonobstructing left renal stone is seen in  the lower pole. No obstructive changes are seen. Exophytic hypodense  lesion is again seen from the right kidney stable in appearance. The bladder is decompressed. Stomach/Bowel: Colon shows scattered fecal material without obstructive change. No inflammatory changes are noted. The appendix is within normal limits. Small bowel and stomach are unremarkable. Vascular/Lymphatic: Aortic atherosclerosis. No enlarged abdominal or pelvic lymph nodes. Reproductive: Uterus and bilateral adnexa are unremarkable. Other: No abdominal wall hernia or abnormality. No abdominopelvic ascites. Musculoskeletal: No acute bony abnormality is noted. Stable skin thickening is noted in the pannus and extending into the umbilicus consistent with focal cellulitis. IMPRESSION: CT of the chest: Mild bibasilar atelectatic changes. CT of the abdomen and pelvis: Stable inflammatory changes in the anterior abdominal wall consistent with cellulitis. Nonobstructing left renal stone. Exophytic right renal lesion stable from the prior exam. Again nonemergent ultrasound can be performed for further evaluation. Electronically Signed   By: Alcide Clever M.D.   On: 05/31/2022 02:36   DG Chest Port 1 View  Result Date: 05/31/2022 CLINICAL DATA:  Respiratory distress. EXAM: PORTABLE CHEST 1 VIEW COMPARISON:  March 28, 2022 FINDINGS: There is stable tracheostomy tube positioning. The cardiac silhouette is enlarged and unchanged in size. Moderate severity prominence of the pulmonary vasculature is seen. There is no evidence of acute infiltrate, pleural effusion or pneumothorax. The visualized skeletal structures are unremarkable. IMPRESSION: Stable cardiomegaly and moderate severity pulmonary vascular congestion. Electronically Signed   By: Aram Candela M.D.   On: 05/31/2022 01:11      IMPRESSION AND PLAN:  Assessment and Plan: * Acute asthma exacerbation - The patient be admitted to a PCU bed. - We will continue steroid therapy with IV Solu-Medrol. - Continue bronchodilator therapy with DuoNebs 4 times daily  and every 4 hours as needed. - O2 protocol will be followed.  Sepsis due to cellulitis [facility]) - The patient will be placed on IV Rocephin. - We will hydrate with IV normal saline. - We will follow blood cultures.  Acute on chronic respiratory failure with hypoxia (HCC) - O2 protocol will be followed.  H/O tracheostomy - Tracheostomy care will be pursued.  GERD without esophagitis - We will continue PPI therapy.   DVT prophylaxis: Lovenox.  Advanced Care Planning:  Code Status: full code.  Family Communication:  The plan of care was discussed in details with the patient (and family). I answered all questions. The patient agreed to proceed with the above mentioned plan. Further management will depend upon hospital course. Disposition Plan: Back to previous home environment Consults called: none.  All the records are reviewed and case discussed with ED provider.  Status is: Inpatient  At the time of the admission, it appears that the appropriate admission status for this patient is inpatient.  This is judged to be reasonable and necessary in order to provide the required intensity of service to ensure the patient's safety given the presenting symptoms, physical exam findings and initial radiographic and laboratory data in the context of comorbid conditions.  The patient requires inpatient status due to high intensity of service, high risk of further deterioration and high frequency of surveillance required.  I certify that at the time of admission, it is my clinical judgment that the patient will require inpatient hospital care extending more than 2 midnights.                            Dispo: The patient is from: Home  Anticipated d/c is to: Home              Patient currently is not medically stable to d/c.              Difficult to place patient: No  Christel Mormon M.D on 05/31/2022 at 5:31 AM  [facility]   From 7 PM-7 AM, contact  night-coverage [email or web address]  CC: Primary care physician; Jeanell Sparrow Trevor Mace, MD

## 2022-05-31 NOTE — ED Notes (Signed)
RN at bedside to prepare pt for discharge. Pt questioning missing prescription for pain medication. This RN will message MD in regard to pt concern.

## 2022-06-01 LAB — URINE CULTURE

## 2022-06-05 LAB — CULTURE, BLOOD (ROUTINE X 2)
Culture: NO GROWTH
Special Requests: ADEQUATE

## 2022-07-26 DIAGNOSIS — R7303 Prediabetes: Secondary | ICD-10-CM | POA: Insufficient documentation

## 2022-08-12 DIAGNOSIS — Z9981 Dependence on supplemental oxygen: Secondary | ICD-10-CM | POA: Insufficient documentation

## 2022-09-29 ENCOUNTER — Emergency Department
Admission: EM | Admit: 2022-09-29 | Discharge: 2022-09-29 | Disposition: A | Discharge status: Home / Self Care | Payer: Medicaid Other | Attending: Student in an Organized Health Care Education/Training Program | Admitting: Student in an Organized Health Care Education/Training Program

## 2022-09-29 ENCOUNTER — Emergency Department: Payer: Medicaid Other

## 2022-09-29 DIAGNOSIS — J45909 Unspecified asthma, uncomplicated: Secondary | ICD-10-CM | POA: Diagnosis not present

## 2022-09-29 DIAGNOSIS — M549 Dorsalgia, unspecified: Secondary | ICD-10-CM | POA: Diagnosis not present

## 2022-09-29 DIAGNOSIS — R1030 Lower abdominal pain, unspecified: Secondary | ICD-10-CM | POA: Insufficient documentation

## 2022-09-29 LAB — COMPREHENSIVE METABOLIC PANEL
ALT: 13 U/L (ref 0–44)
AST: 17 U/L (ref 15–41)
Albumin: 3.9 g/dL (ref 3.5–5.0)
Alkaline Phosphatase: 60 U/L (ref 38–126)
Anion gap: 6 (ref 5–15)
BUN: 16 mg/dL (ref 6–20)
CO2: 30 mmol/L (ref 22–32)
Calcium: 8.7 mg/dL — ABNORMAL LOW (ref 8.9–10.3)
Chloride: 99 mmol/L (ref 98–111)
Creatinine, Ser: 1.09 mg/dL — ABNORMAL HIGH (ref 0.44–1.00)
GFR, Estimated: >60 mL/min (ref >60)
Glucose, Bld: 91 mg/dL (ref 70–99)
Potassium: 4.3 mmol/L (ref 3.5–5.1)
Sodium: 135 mmol/L (ref 135–145)
Total Bilirubin: 0.5 mg/dL (ref 0.3–1.2)
Total Protein: 7.7 g/dL (ref 6.5–8.1)

## 2022-09-29 LAB — CBC
HCT: 41.2 % (ref 36.0–46.0)
Hemoglobin: 12.8 g/dL (ref 12.0–15.0)
MCH: 26.5 pg (ref 26.0–34.0)
MCHC: 31.1 g/dL (ref 30.0–36.0)
MCV: 85.3 fL (ref 80.0–100.0)
Platelets: 223 10*3/uL (ref 150–400)
RBC: 4.83 MIL/uL (ref 3.87–5.11)
RDW: 15.3 % (ref 11.5–15.5)
WBC: 8.9 10*3/uL (ref 4.0–10.5)
nRBC: 0 % (ref 0.0–0.2)

## 2022-09-29 LAB — URINALYSIS, ROUTINE W REFLEX MICROSCOPIC
Bacteria, UA: NONE SEEN
Bilirubin Urine: NEGATIVE
Glucose, UA: NEGATIVE mg/dL
Hgb urine dipstick: NEGATIVE
Ketones, ur: NEGATIVE mg/dL
Leukocytes,Ua: NEGATIVE
Nitrite: NEGATIVE
Protein, ur: 100 mg/dL — AB
Specific Gravity, Urine: 1.016 (ref 1.005–1.030)
pH: 5 (ref 5.0–8.0)

## 2022-09-29 LAB — POC URINE PREG, ED: Preg Test, Ur: NEGATIVE

## 2022-09-29 LAB — LIPASE, BLOOD: Lipase: 49 U/L (ref 11–51)

## 2022-09-29 MED ORDER — OXYCODONE-ACETAMINOPHEN 5-325 MG PO TABS
1.0000 | ORAL_TABLET | Freq: Once | ORAL | Status: AC
  Administered 2022-09-29: 1 via ORAL
  Filled 2022-09-29: qty 1

## 2022-09-29 MED ORDER — IOHEXOL 350 MG/ML SOLN
100.0000 mL | Freq: Once | INTRAVENOUS | Status: AC | PRN
  Administered 2022-09-29: 100 mL via INTRAVENOUS

## 2022-09-29 MED ORDER — ONDANSETRON HCL 4 MG/2ML IJ SOLN
4.0000 mg | Freq: Once | INTRAMUSCULAR | Status: DC
  Filled 2022-09-29: qty 2

## 2022-09-29 MED ORDER — SODIUM CHLORIDE 0.9 % IV BOLUS
500.0000 mL | Freq: Once | INTRAVENOUS | Status: AC
  Administered 2022-09-29: 500 mL via INTRAVENOUS

## 2022-09-29 MED ORDER — DOXYCYCLINE HYCLATE 100 MG PO TABS: 100.0000 mg | ORAL_TABLET | Freq: Two times a day (BID) | ORAL | 0 refills | Status: AC

## 2022-09-29 MED ORDER — MORPHINE SULFATE (PF) 4 MG/ML IV SOLN
4.0000 mg | INTRAVENOUS | Status: DC | PRN
  Filled 2022-09-29: qty 1

## 2022-09-29 MED ORDER — OXYCODONE-ACETAMINOPHEN 5-325 MG PO TABS: 1.0000 | ORAL_TABLET | ORAL | 0 refills | Status: DC | PRN

## 2022-09-29 NOTE — ED Triage Notes (Signed)
Pt sts that she has been abd pain for the last two days. No N/V/D noted.

## 2022-09-29 NOTE — ED Notes (Signed)
Attempted to place oxygen back on pt. Pt states with her trach her SPO2 is normally in high 80s. Mask given to pt, oxygen on 6L. Pt refuses to wear.

## 2022-09-29 NOTE — ED Provider Notes (Signed)
[facility] Provider Note    Event Date/Time   First MD Initiated Contact with Patient 09/29/22 1348     (approximate)   History   Abdominal Pain   HPI  Sandra Holloway is a 46 y.o. female with an extensive past medical history asthma, GERD, status post tracheostomy as well as mission the hospital for cellulitis and sepsis presents to the ER for evaluation of generalized lower abdominal pain.  No measured fevers or chills.  Is having some back pain as well.  Denies any dysuria.  No vomiting no diarrhea.     Physical Exam   Triage Vital Signs: ED Triage Vitals [09/29/22 1319]  Enc Vitals Group     BP      Pulse      Resp      Temp      Temp src      SpO2      Weight (!) 385 lb (174.6 kg)     Height      Head Circumference      Peak Flow      Pain Score 7     Pain Loc      Pain Edu?      Excl. in [place]?     Most recent vital signs: Vitals:   09/29/22 1423 09/29/22 1445  BP: 124/64   Pulse: 74 72  Resp: 20   Temp: 98.1 F (36.7 C)   SpO2: 93% (!) 89%     Constitutional: Alert  Eyes: Conjunctivae are normal.  Head: Atraumatic. Nose: No congestion/rhinnorhea. Mouth/Throat: Mucous membranes are moist.   Neck: Painless ROM.  Cardiovascular:   Good peripheral circulation. Respiratory: Normal respiratory effort.  No retractions.  Gastrointestinal: Soft, obese with generalized lower abdominal pain appears to be isolated to the pannus.   No fluctuance.  Unable to appreciate hernia.  Some mild erythema, no crepitus Musculoskeletal:  no deformity Neurologic:  MAE spontaneously. No gross focal neurologic deficits are appreciated.  Skin:  Skin is warm, dry and intact. No rash noted. Psychiatric: Mood and affect are normal. Speech and behavior are normal.    ED Results / Procedures / Treatments   Labs (all labs ordered are listed, but only abnormal results are displayed) Labs Reviewed  COMPREHENSIVE METABOLIC PANEL - Abnormal; Notable for  the following components:      Result Value   Creatinine, Ser 1.09 (*)    Calcium 8.7 (*)    All other components within normal limits  URINALYSIS, ROUTINE W REFLEX MICROSCOPIC - Abnormal; Notable for the following components:   Color, Urine YELLOW (*)    APPearance HAZY (*)    Protein, ur 100 (*)    All other components within normal limits  LIPASE, BLOOD  CBC  POC URINE PREG, ED      RADIOLOGY Please see ED Course for my review and interpretation.  I personally reviewed all radiographic images ordered to evaluate for the above acute complaints and reviewed radiology reports and findings.  These findings were personally discussed with the patient.  Please see medical record for radiology report.    PROCEDURES:  Critical Care performed:   Procedures   MEDICATIONS ORDERED IN ED: Medications  ondansetron (ZOFRAN) injection 4 mg (4 mg Intravenous Patient Refused/Not Given 09/29/22 1437)  sodium chloride 0.9 % bolus 500 mL (500 mLs Intravenous New Bag/Given 09/29/22 1435)  oxyCODONE-acetaminophen (PERCOCET/ROXICET) 5-325 MG per tablet 1 tablet (1 tablet Oral Given 09/29/22 1437)  oxyCODONE-acetaminophen (PERCOCET/ROXICET) 5-325 MG  per tablet 1 tablet (1 tablet Oral Given 09/29/22 1443)  iohexol (OMNIPAQUE) 350 MG/ML injection 100 mL (100 mLs Intravenous Contrast Given 09/29/22 1457)     IMPRESSION / MDM / ASSESSMENT AND PLAN / ED COURSE  I reviewed the triage vital signs and the nursing notes.                              Differential diagnosis includes, but is not limited to, hernia, appendicitis, colitis, UTI, mass, obstruction, diverticulitis, cellulitis, abscess  Patient presenting to the ER for evaluation of symptoms as described above.  Based on symptoms, risk factors and considered above differential, this presenting complaint could reflect a potentially life-threatening illness therefore the patient will be placed on continuous pulse oximetry and telemetry for  monitoring.  Laboratory evaluation will be sent to evaluate for the above complaints.  CT imaging ordered for the above differential.    Clinical Course as of 09/29/22 1548  Wed Sep 29, 2022  1548 CT imaging on my review and interpretation does not show any evidence of hernia or obstruction.  No sign of fluctuance or abscess.  Given her small amount of warmth in the pannus history of sepsis secondary to cellulitis will treat for early cellulitis with doxycycline.  Patient does appear stable and appropriate for outpatient follow-up. [PR]    Clinical Course User Index [PR] Willy Eddy, MD       FINAL CLINICAL IMPRESSION(S) / ED DIAGNOSES   Final diagnoses:  Lower abdominal pain     Rx / DC Orders   ED Discharge Orders          Ordered    doxycycline (VIBRA-TABS) 100 MG tablet  2 times daily        09/29/22 1542    oxyCODONE-acetaminophen (PERCOCET) 5-325 MG tablet  Every 4 hours PRN        09/29/22 1542             Note:  This document was prepared using Dragon voice recognition software and may include unintentional dictation errors.    Willy Eddy, MD 09/29/22 1549

## 2022-09-29 NOTE — ED Notes (Signed)
Pt to ED for RLQ abdo pain since 2 days. Denies urinary symptoms. Pt has trach. Wears oxygen as needed, states APO2 usually runs high 80s.

## 2022-10-16 ENCOUNTER — Emergency Department: Payer: Medicaid Other

## 2022-10-16 ENCOUNTER — Inpatient Hospital Stay
Admission: EM | Admit: 2022-10-16 | Discharge: 2022-10-21 | DRG: 208 | Disposition: A | Discharge status: Home / Self Care | Payer: Medicaid Other | Attending: Internal Medicine | Admitting: Internal Medicine

## 2022-10-16 DIAGNOSIS — J9602 Acute respiratory failure with hypercapnia: Principal | ICD-10-CM

## 2022-10-16 DIAGNOSIS — J386 Stenosis of larynx: Secondary | ICD-10-CM

## 2022-10-16 DIAGNOSIS — Z930 Tracheostomy status: Secondary | ICD-10-CM

## 2022-10-16 DIAGNOSIS — W44F9XA Other object of natural or organic material, entering into or through a natural orifice, initial encounter: Secondary | ICD-10-CM | POA: Diagnosis present

## 2022-10-16 DIAGNOSIS — J9600 Acute respiratory failure, unspecified whether with hypoxia or hypercapnia: Secondary | ICD-10-CM | POA: Diagnosis present

## 2022-10-16 DIAGNOSIS — J45909 Unspecified asthma, uncomplicated: Secondary | ICD-10-CM | POA: Diagnosis present

## 2022-10-16 DIAGNOSIS — K219 Gastro-esophageal reflux disease without esophagitis: Secondary | ICD-10-CM | POA: Diagnosis present

## 2022-10-16 DIAGNOSIS — I129 Hypertensive chronic kidney disease with stage 1 through stage 4 chronic kidney disease, or unspecified chronic kidney disease: Secondary | ICD-10-CM | POA: Diagnosis present

## 2022-10-16 DIAGNOSIS — J9622 Acute and chronic respiratory failure with hypercapnia: Principal | ICD-10-CM | POA: Diagnosis present

## 2022-10-16 DIAGNOSIS — N179 Acute kidney failure, unspecified: Secondary | ICD-10-CM | POA: Diagnosis present

## 2022-10-16 DIAGNOSIS — Z5329 Procedure and treatment not carried out because of patient's decision for other reasons: Secondary | ICD-10-CM | POA: Diagnosis present

## 2022-10-16 DIAGNOSIS — R9431 Abnormal electrocardiogram [ECG] [EKG]: Secondary | ICD-10-CM | POA: Diagnosis present

## 2022-10-16 DIAGNOSIS — N182 Chronic kidney disease, stage 2 (mild): Secondary | ICD-10-CM | POA: Diagnosis present

## 2022-10-16 DIAGNOSIS — M62830 Muscle spasm of back: Secondary | ICD-10-CM | POA: Diagnosis present

## 2022-10-16 DIAGNOSIS — E662 Morbid (severe) obesity with alveolar hypoventilation: Secondary | ICD-10-CM | POA: Diagnosis present

## 2022-10-16 DIAGNOSIS — Z1152 Encounter for screening for COVID-19: Secondary | ICD-10-CM

## 2022-10-16 DIAGNOSIS — Z79899 Other long term (current) drug therapy: Secondary | ICD-10-CM

## 2022-10-16 DIAGNOSIS — T17998A Other foreign object in respiratory tract, part unspecified causing other injury, initial encounter: Secondary | ICD-10-CM | POA: Diagnosis present

## 2022-10-16 DIAGNOSIS — Z6841 Body Mass Index (BMI) 40.0 and over, adult: Secondary | ICD-10-CM

## 2022-10-16 DIAGNOSIS — R451 Restlessness and agitation: Secondary | ICD-10-CM | POA: Diagnosis present

## 2022-10-16 DIAGNOSIS — J9621 Acute and chronic respiratory failure with hypoxia: Secondary | ICD-10-CM | POA: Diagnosis present

## 2022-10-16 DIAGNOSIS — J9601 Acute respiratory failure with hypoxia: Principal | ICD-10-CM

## 2022-10-16 DIAGNOSIS — J189 Pneumonia, unspecified organism: Secondary | ICD-10-CM

## 2022-10-16 LAB — RESP PANEL BY RT-PCR (FLU A&B, COVID) ARPGX2
Influenza A by PCR: NEGATIVE
Influenza B by PCR: NEGATIVE
SARS Coronavirus 2 by RT PCR: NEGATIVE

## 2022-10-16 MED ORDER — LORAZEPAM 2 MG/ML IJ SOLN
1.0000 mg | Freq: Once | INTRAMUSCULAR | Status: AC
  Administered 2022-10-16: 1 mg via INTRAVENOUS
  Filled 2022-10-16: qty 1

## 2022-10-16 MED ORDER — LORAZEPAM 2 MG/ML IJ SOLN
1.0000 mg | Freq: Once | INTRAMUSCULAR | Status: AC
  Administered 2022-10-16: 1 mg via INTRAVENOUS

## 2022-10-16 MED ORDER — VANCOMYCIN HCL IN DEXTROSE 1-5 GM/200ML-% IV SOLN
1000.0000 mg | Freq: Once | INTRAVENOUS | Status: AC
  Administered 2022-10-17: 1000 mg via INTRAVENOUS
  Filled 2022-10-16: qty 200

## 2022-10-16 MED ORDER — SODIUM CHLORIDE 0.9 % IV SOLN
2.0000 g | Freq: Once | INTRAVENOUS | Status: AC
  Administered 2022-10-16: 2 g via INTRAVENOUS
  Filled 2022-10-16: qty 12.5

## 2022-10-16 MED ORDER — METRONIDAZOLE 500 MG/100ML IV SOLN
500.0000 mg | Freq: Once | INTRAVENOUS | Status: AC
  Administered 2022-10-17: 500 mg via INTRAVENOUS
  Filled 2022-10-16: qty 100

## 2022-10-16 NOTE — ED Provider Notes (Signed)
[facility] Provider Note    Event Date/Time   First MD Initiated Contact with Patient 10/16/22 2242     (approximate)   History   Respiratory distress   HPI  Sandra Holloway is a 46 y.o. female who presents to the emergency department via EMS today because of concerns for respiratory distress.  Patient herself is unable to give any significant history given the respiratory distress.  EMS states that she had a thick secretions coming out of her trach site.  They did start bagging the patient.  She was altered and agitated. Per chart review the patient has had multiple admissions for sepsis and respiratory failure.      Physical Exam   Most recent vital signs: Vitals:   10/16/22 2245 10/16/22 2300  BP: (!) 162/95 127/89  Pulse: (!) 117 (!) 103  Resp: (!) 23 20  SpO2: 91% 99%   General: Awake, agitated. CV:  Good peripheral perfusion. Tachycardia. Resp:  Increased respiratory effort. Poor air movement diffusely. Trach in place.  Abd:  No distention.     ED Results / Procedures / Treatments   Labs (all labs ordered are listed, but only abnormal results are displayed) Labs Reviewed  BLOOD GAS, ARTERIAL - Abnormal; Notable for the following components:      Result Value   pH, Arterial 7.13 (*)    pO2, Arterial 161 (*)    Acid-base deficit 4.5 (*)    All other components within normal limits  RESP PANEL BY RT-PCR (FLU A&B, COVID) ARPGX2  CULTURE, BLOOD (ROUTINE X 2)  CULTURE, BLOOD (ROUTINE X 2)  LACTIC ACID, PLASMA  LACTIC ACID, PLASMA  COMPREHENSIVE METABOLIC PANEL  CBC WITH DIFFERENTIAL/PLATELET  URINALYSIS, COMPLETE (UACMP) WITH MICROSCOPIC  POC URINE PREG, ED     EKG  I, Phineas Semen, attending physician, personally viewed and interpreted this EKG  EKG Time: 2238 Rate: 127 Rhythm: sinus tachycardia Axis: normal Intervals: qtc 453 QRS: narrow ST changes: no st elevation Impression: abnormal ekg   RADIOLOGY I  independently interpreted and visualized the CXR. My interpretation: diffuse opacities Radiology interpretation:  IMPRESSION:  1. Ill-defined bilateral perihilar opacities, may represent  pulmonary edema or infection.  2. Stable cardiomegaly and vascular congestion.  3. Tracheostomy tube tip at the thoracic inlet.         PROCEDURES:  Critical Care performed: Yes, see critical care procedure note(s)  Procedures  CRITICAL CARE Performed by: Phineas Semen   Total critical care time: 40 minutes  Critical care time was exclusive of separately billable procedures and treating other patients.  Critical care was necessary to treat or prevent imminent or life-threatening deterioration.  Critical care was time spent personally by me on the following activities: development of treatment plan with patient and/or surrogate as well as nursing, discussions with consultants, evaluation of patient's response to treatment, examination of patient, obtaining history from patient or surrogate, ordering and performing treatments and interventions, ordering and review of laboratory studies, ordering and review of radiographic studies, pulse oximetry and re-evaluation of patient's condition.   MEDICATIONS ORDERED IN ED: Medications - No data to display   IMPRESSION / MDM / ASSESSMENT AND PLAN / ED COURSE  I reviewed the triage vital signs and the nursing notes.                              Differential diagnosis includes, but is not limited to, covid, influenza, pneumonia.  Patient's presentation is most consistent with acute presentation with potential threat to life or bodily function.  The patient is on the cardiac monitor to evaluate for evidence of arrhythmia and/or significant heart rate changes.  Patient presented to the emergency department today via EMS as emergency traffic because of concerns for respiratory failure and altered mental status.  On exam here initially patient was  quite agitated.  She was trying to interfere with her trach.  Because of this I did asked some Ativan to be given.  Given concerns for respiratory failure we did have the patient be placed on a ventilator.  This did help calm her down.  Was found to be quite tachycardic here.  ABG is concerning for hypercapnia and acidosis.  Chest x-ray concerning for pneumonia.  Did start broad-spectrum antibiotics. Discussed with provider Tukov-Yual with the ICU team who will plan on admission.     FINAL CLINICAL IMPRESSION(S) / ED DIAGNOSES   Final diagnoses:  Acute respiratory failure with hypoxia and hypercapnia (HCC)  Pneumonia due to infectious organism, unspecified laterality, unspecified part of lung    Note:  This document was prepared using Dragon voice recognition software and may include unintentional dictation errors.    Phineas Semen, MD 10/16/22 [phone]

## 2022-10-16 NOTE — ED Triage Notes (Signed)
Pt arrives in resp distress, pt with trach, being bagged on arrival. Pt agitated, per ems pt with thick secretions in trach and pox in 70s on their arrival.

## 2022-10-17 DIAGNOSIS — R7881 Bacteremia: Secondary | ICD-10-CM | POA: Diagnosis not present

## 2022-10-17 DIAGNOSIS — W44F9XA Other object of natural or organic material, entering into or through a natural orifice, initial encounter: Secondary | ICD-10-CM | POA: Diagnosis present

## 2022-10-17 DIAGNOSIS — Z430 Encounter for attention to tracheostomy: Secondary | ICD-10-CM | POA: Diagnosis not present

## 2022-10-17 DIAGNOSIS — M62830 Muscle spasm of back: Secondary | ICD-10-CM | POA: Diagnosis present

## 2022-10-17 DIAGNOSIS — J9600 Acute respiratory failure, unspecified whether with hypoxia or hypercapnia: Secondary | ICD-10-CM | POA: Diagnosis present

## 2022-10-17 DIAGNOSIS — N179 Acute kidney failure, unspecified: Secondary | ICD-10-CM | POA: Diagnosis present

## 2022-10-17 DIAGNOSIS — I129 Hypertensive chronic kidney disease with stage 1 through stage 4 chronic kidney disease, or unspecified chronic kidney disease: Secondary | ICD-10-CM | POA: Diagnosis present

## 2022-10-17 DIAGNOSIS — Z1152 Encounter for screening for COVID-19: Secondary | ICD-10-CM | POA: Diagnosis not present

## 2022-10-17 DIAGNOSIS — Z930 Tracheostomy status: Secondary | ICD-10-CM | POA: Diagnosis not present

## 2022-10-17 DIAGNOSIS — E662 Morbid (severe) obesity with alveolar hypoventilation: Secondary | ICD-10-CM | POA: Diagnosis present

## 2022-10-17 DIAGNOSIS — K219 Gastro-esophageal reflux disease without esophagitis: Secondary | ICD-10-CM | POA: Diagnosis present

## 2022-10-17 DIAGNOSIS — J9602 Acute respiratory failure with hypercapnia: Secondary | ICD-10-CM | POA: Diagnosis not present

## 2022-10-17 DIAGNOSIS — J9621 Acute and chronic respiratory failure with hypoxia: Secondary | ICD-10-CM | POA: Diagnosis present

## 2022-10-17 DIAGNOSIS — N182 Chronic kidney disease, stage 2 (mild): Secondary | ICD-10-CM | POA: Diagnosis present

## 2022-10-17 DIAGNOSIS — Z6841 Body Mass Index (BMI) 40.0 and over, adult: Secondary | ICD-10-CM | POA: Diagnosis not present

## 2022-10-17 DIAGNOSIS — J9601 Acute respiratory failure with hypoxia: Secondary | ICD-10-CM

## 2022-10-17 DIAGNOSIS — I5031 Acute diastolic (congestive) heart failure: Secondary | ICD-10-CM | POA: Diagnosis not present

## 2022-10-17 DIAGNOSIS — R9431 Abnormal electrocardiogram [ECG] [EKG]: Secondary | ICD-10-CM | POA: Diagnosis present

## 2022-10-17 DIAGNOSIS — R451 Restlessness and agitation: Secondary | ICD-10-CM | POA: Diagnosis present

## 2022-10-17 DIAGNOSIS — Z79899 Other long term (current) drug therapy: Secondary | ICD-10-CM | POA: Diagnosis not present

## 2022-10-17 DIAGNOSIS — Z5329 Procedure and treatment not carried out because of patient's decision for other reasons: Secondary | ICD-10-CM | POA: Diagnosis present

## 2022-10-17 DIAGNOSIS — J45909 Unspecified asthma, uncomplicated: Secondary | ICD-10-CM | POA: Diagnosis present

## 2022-10-17 DIAGNOSIS — J189 Pneumonia, unspecified organism: Secondary | ICD-10-CM | POA: Diagnosis present

## 2022-10-17 DIAGNOSIS — T17998A Other foreign object in respiratory tract, part unspecified causing other injury, initial encounter: Secondary | ICD-10-CM | POA: Diagnosis present

## 2022-10-17 DIAGNOSIS — J9622 Acute and chronic respiratory failure with hypercapnia: Secondary | ICD-10-CM | POA: Diagnosis present

## 2022-10-17 DIAGNOSIS — J386 Stenosis of larynx: Secondary | ICD-10-CM | POA: Diagnosis present

## 2022-10-17 LAB — CBC
HCT: 37.3 % (ref 36.0–46.0)
Hemoglobin: 11.8 g/dL — ABNORMAL LOW (ref 12.0–15.0)
MCH: 27.2 pg (ref 26.0–34.0)
MCHC: 31.6 g/dL (ref 30.0–36.0)
MCV: 85.9 fL (ref 80.0–100.0)
Platelets: 219 10*3/uL (ref 150–400)
RBC: 4.34 MIL/uL (ref 3.87–5.11)
RDW: 15.4 % (ref 11.5–15.5)
WBC: 15.9 10*3/uL — ABNORMAL HIGH (ref 4.0–10.5)
nRBC: 0 % (ref 0.0–0.2)

## 2022-10-17 LAB — CBC WITH DIFFERENTIAL/PLATELET
Abs Immature Granulocytes: 0.17 10*3/uL — ABNORMAL HIGH (ref 0.00–0.07)
Abs Immature Granulocytes: 0.05 10*3/uL (ref 0.00–0.07)
Basophils Absolute: 0.1 10*3/uL (ref 0.0–0.1)
Basophils Absolute: 0 10*3/uL (ref 0.0–0.1)
Basophils Relative: 1 %
Basophils Relative: 0 %
Eosinophils Absolute: 0.3 10*3/uL (ref 0.0–0.5)
Eosinophils Absolute: 0 10*3/uL (ref 0.0–0.5)
Eosinophils Relative: 1 %
Eosinophils Relative: 0 %
HCT: 44.6 % (ref 36.0–46.0)
HCT: 34.9 % — ABNORMAL LOW (ref 36.0–46.0)
Hemoglobin: 13.6 g/dL (ref 12.0–15.0)
Hemoglobin: 11.1 g/dL — ABNORMAL LOW (ref 12.0–15.0)
Immature Granulocytes: 1 %
Immature Granulocytes: 0 %
Lymphocytes Relative: 48 %
Lymphocytes Relative: 9 %
Lymphs Abs: 10.4 10*3/uL — ABNORMAL HIGH (ref 0.7–4.0)
Lymphs Abs: 1.3 10*3/uL (ref 0.7–4.0)
MCH: 26.8 pg (ref 26.0–34.0)
MCH: 27.2 pg (ref 26.0–34.0)
MCHC: 30.5 g/dL (ref 30.0–36.0)
MCHC: 31.8 g/dL (ref 30.0–36.0)
MCV: 87.8 fL (ref 80.0–100.0)
MCV: 85.5 fL (ref 80.0–100.0)
Monocytes Absolute: 1.3 10*3/uL — ABNORMAL HIGH (ref 0.1–1.0)
Monocytes Absolute: 1.1 10*3/uL — ABNORMAL HIGH (ref 0.1–1.0)
Monocytes Relative: 6 %
Monocytes Relative: 8 %
Neutro Abs: 9.3 10*3/uL — ABNORMAL HIGH (ref 1.7–7.7)
Neutro Abs: 11.8 10*3/uL — ABNORMAL HIGH (ref 1.7–7.7)
Neutrophils Relative %: 43 %
Neutrophils Relative %: 83 %
Platelets: 316 10*3/uL (ref 150–400)
Platelets: 221 10*3/uL (ref 150–400)
RBC: 5.08 MIL/uL (ref 3.87–5.11)
RBC: 4.08 MIL/uL (ref 3.87–5.11)
RDW: 15.5 % (ref 11.5–15.5)
RDW: 15.4 % (ref 11.5–15.5)
Smear Review: NORMAL
WBC: 21.5 10*3/uL — ABNORMAL HIGH (ref 4.0–10.5)
WBC: 14.2 10*3/uL — ABNORMAL HIGH (ref 4.0–10.5)
nRBC: 0 % (ref 0.0–0.2)
nRBC: 0 % (ref 0.0–0.2)

## 2022-10-17 LAB — BASIC METABOLIC PANEL
Anion gap: 9 (ref 5–15)
BUN: 27 mg/dL — ABNORMAL HIGH (ref 6–20)
CO2: 29 mmol/L (ref 22–32)
Calcium: 8.8 mg/dL — ABNORMAL LOW (ref 8.9–10.3)
Chloride: 101 mmol/L (ref 98–111)
Creatinine, Ser: 1.99 mg/dL — ABNORMAL HIGH (ref 0.44–1.00)
GFR, Estimated: 31 mL/min — ABNORMAL LOW (ref >60)
Glucose, Bld: 145 mg/dL — ABNORMAL HIGH (ref 70–99)
Potassium: 4.5 mmol/L (ref 3.5–5.1)
Sodium: 139 mmol/L (ref 135–145)

## 2022-10-17 LAB — BLOOD CULTURE ID PANEL (REFLEXED) - BCID2

## 2022-10-17 LAB — MAGNESIUM: Magnesium: 1.9 mg/dL (ref 1.7–2.4)

## 2022-10-17 LAB — URINALYSIS, COMPLETE (UACMP) WITH MICROSCOPIC
Bacteria, UA: NONE SEEN
Bilirubin Urine: NEGATIVE
Glucose, UA: NEGATIVE mg/dL
Hgb urine dipstick: NEGATIVE
Ketones, ur: NEGATIVE mg/dL
Leukocytes,Ua: NEGATIVE
Nitrite: NEGATIVE
Protein, ur: 100 mg/dL — AB
Specific Gravity, Urine: 1.011 (ref 1.005–1.030)
pH: 5 (ref 5.0–8.0)

## 2022-10-17 LAB — BLOOD GAS, ARTERIAL
Acid-Base Excess: 1.6 mmol/L (ref 0.0–2.0)
Bicarbonate: 29.8 mmol/L — ABNORMAL HIGH (ref 20.0–28.0)
FIO2: 40 %
MECHVT: 450 mL
Mechanical Rate: 16
O2 Saturation: 98.9 %
PEEP: 5 cmH2O
Patient temperature: 37
pCO2 arterial: 62 mmHg — ABNORMAL HIGH (ref 32–48)
pH, Arterial: 7.29 — ABNORMAL LOW (ref 7.35–7.45)
pO2, Arterial: 112 mmHg — ABNORMAL HIGH (ref 83–108)

## 2022-10-17 LAB — COMPREHENSIVE METABOLIC PANEL
ALT: 18 U/L (ref 0–44)
ALT: 16 U/L (ref 0–44)
AST: 31 U/L (ref 15–41)
AST: 22 U/L (ref 15–41)
Albumin: 4.5 g/dL (ref 3.5–5.0)
Albumin: 3.8 g/dL (ref 3.5–5.0)
Alkaline Phosphatase: 82 U/L (ref 38–126)
Alkaline Phosphatase: 58 U/L (ref 38–126)
Anion gap: 13 (ref 5–15)
Anion gap: 7 (ref 5–15)
BUN: 17 mg/dL (ref 6–20)
BUN: 19 mg/dL (ref 6–20)
CO2: 26 mmol/L (ref 22–32)
CO2: 28 mmol/L (ref 22–32)
Calcium: 9.3 mg/dL (ref 8.9–10.3)
Calcium: 8.8 mg/dL — ABNORMAL LOW (ref 8.9–10.3)
Chloride: 101 mmol/L (ref 98–111)
Chloride: 104 mmol/L (ref 98–111)
Creatinine, Ser: 1.27 mg/dL — ABNORMAL HIGH (ref 0.44–1.00)
Creatinine, Ser: 1.53 mg/dL — ABNORMAL HIGH (ref 0.44–1.00)
GFR, Estimated: 53 mL/min — ABNORMAL LOW (ref >60)
GFR, Estimated: 42 mL/min — ABNORMAL LOW (ref >60)
Glucose, Bld: 250 mg/dL — ABNORMAL HIGH (ref 70–99)
Glucose, Bld: 131 mg/dL — ABNORMAL HIGH (ref 70–99)
Potassium: 4 mmol/L (ref 3.5–5.1)
Potassium: 5.2 mmol/L — ABNORMAL HIGH (ref 3.5–5.1)
Sodium: 140 mmol/L (ref 135–145)
Sodium: 139 mmol/L (ref 135–145)
Total Bilirubin: 0.7 mg/dL (ref 0.3–1.2)
Total Bilirubin: 0.9 mg/dL (ref 0.3–1.2)
Total Protein: 9.1 g/dL — ABNORMAL HIGH (ref 6.5–8.1)
Total Protein: 7.5 g/dL (ref 6.5–8.1)

## 2022-10-17 LAB — STREP PNEUMONIAE URINARY ANTIGEN: Strep Pneumo Urinary Antigen: NEGATIVE

## 2022-10-17 LAB — MRSA NEXT GEN BY PCR, NASAL: MRSA by PCR Next Gen: NOT DETECTED

## 2022-10-17 LAB — LACTIC ACID, PLASMA
Lactic Acid, Venous: 5.9 mmol/L (ref 0.5–1.9)
Lactic Acid, Venous: 1.3 mmol/L (ref 0.5–1.9)

## 2022-10-17 LAB — BRAIN NATRIURETIC PEPTIDE: B Natriuretic Peptide: 55.8 pg/mL (ref 0.0–100.0)

## 2022-10-17 LAB — HIV ANTIBODY (ROUTINE TESTING W REFLEX): HIV Screen 4th Generation wRfx: NONREACTIVE

## 2022-10-17 LAB — GLUCOSE, CAPILLARY
Glucose-Capillary: 97 mg/dL (ref 70–99)
Glucose-Capillary: 152 mg/dL — ABNORMAL HIGH (ref 70–99)
Glucose-Capillary: 145 mg/dL — ABNORMAL HIGH (ref 70–99)
Glucose-Capillary: 148 mg/dL — ABNORMAL HIGH (ref 70–99)
Glucose-Capillary: 113 mg/dL — ABNORMAL HIGH (ref 70–99)

## 2022-10-17 MED ORDER — SODIUM CHLORIDE 0.9 % IV SOLN
500.0000 mg | Freq: Every day | INTRAVENOUS | Status: DC
  Administered 2022-10-17 – 2022-10-18 (×2): 500 mg via INTRAVENOUS
  Filled 2022-10-17 (×2): qty 500
  Filled 2022-10-17: qty 5

## 2022-10-17 MED ORDER — OXYCODONE-ACETAMINOPHEN 7.5-325 MG PO TABS
1.0000 | ORAL_TABLET | Freq: Three times a day (TID) | ORAL | Status: DC | PRN
  Administered 2022-10-17 – 2022-10-20 (×8): 1 via ORAL
  Filled 2022-10-17 (×8): qty 1

## 2022-10-17 MED ORDER — POLYETHYLENE GLYCOL 3350 17 G PO PACK: 17.0000 g | PACK | Freq: Every day | ORAL | Status: DC | PRN

## 2022-10-17 MED ORDER — ADULT MULTIVITAMIN W/MINERALS CH
1.0000 | ORAL_TABLET | Freq: Every day | ORAL | Status: DC
  Administered 2022-10-17 – 2022-10-21 (×5): 1 via ORAL
  Filled 2022-10-17 (×5): qty 1

## 2022-10-17 MED ORDER — SODIUM CHLORIDE 0.9 % IV SOLN
2.0000 g | Freq: Three times a day (TID) | INTRAVENOUS | Status: DC
  Administered 2022-10-17 – 2022-10-18 (×4): 2 g via INTRAVENOUS
  Filled 2022-10-17: qty 2
  Filled 2022-10-17: qty 12.5
  Filled 2022-10-17: qty 2
  Filled 2022-10-17 (×2): qty 12.5

## 2022-10-17 MED ORDER — SODIUM CHLORIDE 0.9 % IV BOLUS: 500.0000 mL | Freq: Once | INTRAVENOUS | Status: DC

## 2022-10-17 MED ORDER — HEPARIN SODIUM (PORCINE) 5000 UNIT/ML IJ SOLN
5000.0000 [IU] | Freq: Three times a day (TID) | INTRAMUSCULAR | Status: DC
  Administered 2022-10-17 – 2022-10-18 (×4): 5000 [IU] via SUBCUTANEOUS
  Filled 2022-10-17 (×8): qty 1

## 2022-10-17 MED ORDER — ENSURE MAX PROTEIN PO LIQD
11.0000 [oz_av] | Freq: Every day | ORAL | Status: DC
  Administered 2022-10-17 – 2022-10-18 (×2): 11 [oz_av] via ORAL
  Filled 2022-10-17: qty 330

## 2022-10-17 MED ORDER — BUDESONIDE 0.5 MG/2ML IN SUSP
0.5000 mg | Freq: Two times a day (BID) | RESPIRATORY_TRACT | Status: DC
  Administered 2022-10-17 – 2022-10-18 (×3): 0.5 mg via RESPIRATORY_TRACT
  Filled 2022-10-17 (×3): qty 2

## 2022-10-17 MED ORDER — METHYLPREDNISOLONE SODIUM SUCC 40 MG IJ SOLR
40.0000 mg | INTRAMUSCULAR | Status: DC
  Administered 2022-10-17 – 2022-10-18 (×2): 40 mg via INTRAVENOUS
  Filled 2022-10-17 (×2): qty 1

## 2022-10-17 MED ORDER — DOCUSATE SODIUM 100 MG PO CAPS: 100.0000 mg | ORAL_CAPSULE | Freq: Two times a day (BID) | ORAL | Status: DC | PRN

## 2022-10-17 MED ORDER — PANTOPRAZOLE SODIUM 40 MG IV SOLR
40.0000 mg | INTRAVENOUS | Status: DC
  Administered 2022-10-17 – 2022-10-18 (×2): 40 mg via INTRAVENOUS
  Filled 2022-10-17 (×2): qty 10

## 2022-10-17 MED ORDER — FAMOTIDINE 20 MG PO TABS
20.0000 mg | ORAL_TABLET | Freq: Two times a day (BID) | ORAL | Status: DC
  Administered 2022-10-17 – 2022-10-18 (×3): 20 mg
  Filled 2022-10-17 (×3): qty 1

## 2022-10-17 MED ORDER — FUROSEMIDE 10 MG/ML IJ SOLN
40.0000 mg | Freq: Every day | INTRAMUSCULAR | Status: DC
  Administered 2022-10-17 – 2022-10-18 (×2): 40 mg via INTRAVENOUS
  Filled 2022-10-17 (×2): qty 4

## 2022-10-17 MED ORDER — IPRATROPIUM-ALBUTEROL 0.5-2.5 (3) MG/3ML IN SOLN: 3.0000 mL | RESPIRATORY_TRACT | Status: DC | PRN

## 2022-10-17 MED ORDER — FENTANYL CITRATE PF 50 MCG/ML IJ SOSY
25.0000 ug | PREFILLED_SYRINGE | INTRAMUSCULAR | Status: DC | PRN
  Administered 2022-10-17 (×2): 50 ug via INTRAVENOUS
  Filled 2022-10-17 (×3): qty 1

## 2022-10-17 MED ORDER — LORAZEPAM 2 MG/ML IJ SOLN
1.0000 mg | INTRAMUSCULAR | Status: DC | PRN
  Administered 2022-10-17 – 2022-10-21 (×6): 1 mg via INTRAVENOUS
  Filled 2022-10-17 (×6): qty 1

## 2022-10-17 MED ORDER — VANCOMYCIN HCL 750 MG/150ML IV SOLN
750.0000 mg | Freq: Two times a day (BID) | INTRAVENOUS | Status: DC
  Filled 2022-10-17: qty 150

## 2022-10-17 MED ORDER — IPRATROPIUM-ALBUTEROL 0.5-2.5 (3) MG/3ML IN SOLN
3.0000 mL | RESPIRATORY_TRACT | Status: DC
  Administered 2022-10-17 – 2022-10-18 (×9): 3 mL via RESPIRATORY_TRACT
  Filled 2022-10-17 (×8): qty 3

## 2022-10-17 MED ORDER — VANCOMYCIN HCL 10 G IV SOLR
1750.0000 mg | INTRAVENOUS | Status: DC
  Filled 2022-10-17: qty 17.5

## 2022-10-17 MED ORDER — LORAZEPAM 2 MG/ML IJ SOLN
1.0000 mg | INTRAMUSCULAR | Status: DC
  Administered 2022-10-17: 1 mg via INTRAVENOUS
  Filled 2022-10-17: qty 1

## 2022-10-17 MED ORDER — INSULIN ASPART 100 UNIT/ML IJ SOLN
0.0000 [IU] | INTRAMUSCULAR | Status: DC
  Filled 2022-10-17: qty 1

## 2022-10-17 MED ORDER — VANCOMYCIN HCL 750 MG/150ML IV SOLN
750.0000 mg | Freq: Two times a day (BID) | INTRAVENOUS | Status: DC
  Administered 2022-10-17 – 2022-10-18 (×3): 750 mg via INTRAVENOUS
  Filled 2022-10-17 (×2): qty 150

## 2022-10-17 MED ORDER — CHLORHEXIDINE GLUCONATE CLOTH 2 % EX PADS
6.0000 | MEDICATED_PAD | Freq: Every day | CUTANEOUS | Status: DC
  Administered 2022-10-17 – 2022-10-21 (×4): 6 via TOPICAL

## 2022-10-17 MED ORDER — ENSURE ENLIVE PO LIQD
237.0000 mL | ORAL | Status: DC
  Administered 2022-10-18 – 2022-10-19 (×2): 237 mL via ORAL

## 2022-10-17 MED ORDER — VANCOMYCIN HCL IN DEXTROSE 1-5 GM/200ML-% IV SOLN
1000.0000 mg | Freq: Once | INTRAVENOUS | Status: AC
  Administered 2022-10-17: 1000 mg via INTRAVENOUS
  Filled 2022-10-17: qty 200

## 2022-10-17 NOTE — Consult Note (Signed)
PHARMACY CONSULT NOTE  Pharmacy Consult for Electrolyte Monitoring and Replacement   Recent Labs: Potassium (mmol/L)  Date Value  10/17/2022 5.2 (H)   Magnesium (mg/dL)  Date Value  98/33/8250 1.9   Calcium (mg/dL)  Date Value  53/97/6734 8.8 (L)   Albumin (g/dL)  Date Value  19/37/9024 3.8   Sodium (mmol/L)  Date Value  10/17/2022 139   Assessment: Patient is a 46 y/o F with medical history including chronic respiratory failure with chronic tracheostomy, morbid obesity, asthma, CKD, HTN, GERD, OSA, obesity hypoventilation syndrome who is admitted with acute on chronic respiratory failure. Patient has been placed on ventilator. Pharmacy consulted to assist with electrolyte monitoring and replacement as indicated.  Scr trending up (1.27 >> 1.53)  Goal of Therapy:  Electrolytes within normal limits  Plan:  --K 5.2, on IV Lasix 40 mg daily --Follow-up electrolytes with AM labs tomorrow  Tressie Ellis 10/17/2022 2:25 PM

## 2022-10-17 NOTE — Progress Notes (Signed)
Initial Nutrition Assessment RD working remotely.  DOCUMENTATION CODES:   Morbid obesity  INTERVENTION:  - ordered Ensure Plus High Protein once/day, each supplement provides 350 kcal and 20 grams of protein.  - ordered Ensure Max once/day, each supplement provides 150 kcal and 30 grams of protein.  - ordered 1 tablet multivitamin with minerals/day.   NUTRITION DIAGNOSIS:   Inadequate oral intake related to inability to eat as evidenced by other (comment) (vent via trach requirement).  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 She presented to the ED via EMS due to concern for respiratory distress, hypoxemia, and increased secretions. EMS started bagging the patient to the ED.  Upon arrival in the ED patient was agitated and altered so she was placed on full vent support. ED work-up suggested sepsis from possible pneumonia. She has been admitted to the ICU for further management.  Patient remains on vent via trach. Order in earlier this morning for placement of NGT. Able to talk with RN via secure chat who shares plan to hold off on NGT placement as plan is for patient to be on vent via trach at night, off during the day with ability to eat, drink, and take pills orally.  Diet advanced from NPO to Heart Healthy, Carb Modified today at 0925; no documented intakes. She has not been assessed by a [place] RD at any time in the past.  Weight today is 409 lb, weight yesterday was 392 lb, and weight on 11/1 was 384 lb. Prior to this month, the most recently  documented weight was 385 lb on 7/3. Moderate pitting edema to BLE documented in the edema section of flow sheet.   Patient is currently on ventilator support via trach MV: 9.3 L/min Temp (24hrs), Avg:98 F (36.7 C), Min:98 F (36.7 C), Max:98 F (36.7 C) Propofol: none   Labs reviewed; CBGs: 97 and 152 mg/dl, K: 5.2 mmol/l, creatinine: 1.53 mg/dl, GFR: 42 ml/min.  Medications reviewed; 20 mg pepcid BID, 40 mg IV lasix/day, sliding scale novolog, 40 mg solu-medrol/day, 40 mg IV protonix/day.    NUTRITION - FOCUSED PHYSICAL EXAM:  RD working remotely.  Diet Order:   Diet Order             Diet heart healthy/carb modified Room service appropriate? Yes; Fluid consistency: Thin  Diet effective now                   EDUCATION NEEDS:   Not appropriate for education at this time  Skin:  Skin Assessment: Reviewed RN Assessment  Last BM:  PTA/unknown  Height:   Ht Readings from Last 1 Encounters:  10/16/22 5\' 2"  (1.575 m)    Weight:   Wt Readings from Last 1 Encounters:  10/17/22 (!) 185.4 kg     BMI:  Body mass index is 74.76 kg/m.  Estimated Nutritional Needs:  Kcal:  2100-2400 kcal Protein:  120-135 grams Fluid:  >/= 2 L/day     10/19/22, MS, RD, LDN, CNSC Clinical Dietitian PRN/Relief staff On-call/weekend pager # available in [facility]

## 2022-10-17 NOTE — H&P (Signed)
PULMONARY / CRITICAL CARE MEDICINE  Name: Sandra Holloway MRN: [id number] DOB: 1976-11-01    LOS: 0  Admitting Provider:  Charise Carwin Reason for Admission:  Acute hypoxic respiratory failure Brief patient description: This is a 46 year old female with a history of chronic respiratory failure with a chronic trach who was brought into the ED via EMS because of concerns of respiratory distress, hypoxemia and increased secretions.  EMS had to start bagging the patient to the ED.  Upon arrival in the ED patient was agitated and altered hence she was placed on full vent support.  ED work-up suggested sepsis from possible pneumonia.  She has been admitted to the ICU for further management.  HPI: History is obtained from patient's record as patient is currently sedated on the vent. This is a 46 year old African-American female with a history of morbid obesity, asthma, CKD stage II, hypertension, GERD, sleep apnea, and obesity hypoventilation syndrome s/p tracheostomy who presented to the ED via EMS with complaints of dyspnea, altered mental status and agitation.  When EMS arrived, patient was struggling to breathe and was hypoxic.  EMS suctioned out thick secretions from her trach and started BMV on route to the ED.  Upon arrival in the ED, patient continued to be agitated, and hypoxic hence she was placed on the vent.  Her ED work-up showed a lactic acid of 5.9, WBC of 21.5, blood glucose of 250 mg/dL, creatinine of 9.93 and had chest x-ray showed bilateral perihilar opacities, cardiomegaly and vascular congestion.  Initial blood gas post stent placement showed a pH of 7.13, PCO2 greater than 70 and a PaO2 of 161.  She is being admitted to the ICU for further management of acute on chronic hypercarbic respiratory failure secondary to pneumonia and sepsis secondary to pneumonia. Past Medical History:  Diagnosis Date   Dyspnea    Hypertension    Past Surgical History:  Procedure Laterality Date    TRACHEOSTOMY     No current facility-administered medications on file prior to encounter.   Current Outpatient Medications on File Prior to Encounter  Medication Sig   acetaminophen (TYLENOL) 325 MG tablet Take 2 tablets (650 mg total) by mouth every 6 (six) hours as needed for mild pain (or Fever >/= 101).   cefadroxil (DURICEF) 500 MG capsule Take 1 capsule (500 mg total) by mouth 2 (two) times daily. (Patient not taking: Reported on 05/31/2022)   DULoxetine (CYMBALTA) 30 MG capsule Take 30 mg by mouth daily.   furosemide (LASIX) 40 MG tablet Take 40 mg by mouth daily.   loratadine (CLARITIN) 10 MG tablet Take 10 mg by mouth daily.   Multiple Vitamin (MULTIVITAMIN WITH MINERALS) TABS tablet Take 1 tablet by mouth daily.   omeprazole (PRILOSEC) 20 MG capsule Take 20 mg by mouth daily.   oxyCODONE-acetaminophen (PERCOCET) 10-325 MG tablet Take 1 tablet by mouth every 6 (six) hours. (Patient not taking: Reported on 05/31/2022)   oxyCODONE-acetaminophen (PERCOCET) 5-325 MG tablet Take 1 tablet by mouth every 4 (four) hours as needed for severe pain.    Allergies No Known Allergies  Family History No family history on file. Social History  reports that she does not currently use alcohol. She reports that she does not currently use drugs. No history on file for tobacco use.  Review Of Systems: Unable to obtain as patient is currently on the vent and sedated.  VITAL SIGNS: BP 138/63   Pulse 77   Temp 98 F (36.7 C) (Oral)   Resp 17  Ht 5\' 2"  (1.575 m)   Wt (!) 178 kg   LMP 09/02/2022 (Exact Date)   SpO2 93%   BMI 71.77 kg/m   HEMODYNAMICS:    VENTILATOR SETTINGS: Vent Mode: PRVC FiO2 (%):  [40 %-100 %] 40 % Set Rate:  [16 bmp] 16 bmp Vt Set:  [450 mL-500 mL] 450 mL PEEP:  [5 cmH20] 5 cmH20 Plateau Pressure:  [22 cmH20] 22 cmH20  INTAKE / OUTPUT: No intake/output data recorded.  PHYSICAL EXAMINATION: General: Morbidly obese, comfortable on the vent HEENT: Trach site  clean and dry, PERRLA Neuro: Awakens to noxious stimulus, moves all extremities, pupils reactive with normal accommodation Cardiovascular: Apical pulse regular, S1-S2, no murmur regurg or gallop Lungs: Bilateral breath sounds, diminished in the bases, no wheezing, no crackles, positive Rales in bilateral lower lung bases Abdomen: Profound central obesity, positive bowel sounds, palpation reveals no organomegaly Musculoskeletal: Positive range of motion in upper and lower extremities Skin: Warm and dry with no rash  LABS:  BMET Recent Labs  Lab 10/16/22 2245 10/17/22 0453  NA 140 139  K 4.0 5.2*  CL 101 104  CO2 26 28  BUN 17 19  CREATININE 1.27* 1.53*  GLUCOSE 250* 131*    Electrolytes Recent Labs  Lab 10/16/22 2245 10/17/22 0453  CALCIUM 9.3 8.8*  MG  --  1.9    CBC Recent Labs  Lab 10/16/22 2245 10/17/22 0453  WBC 21.5* 15.9*  HGB 13.6 11.8*  HCT 44.6 37.3  PLT 316 219    Coag's No results for input(s): "APTT", "INR" in the last 168 hours.  Sepsis Markers Recent Labs  Lab 10/16/22 2245 10/17/22 0126  LATICACIDVEN 5.9* 1.3    ABG Recent Labs  Lab 10/16/22 2251 10/17/22 0500  PHART 7.13* 7.29*  PCO2ART PENDING 62*  PO2ART 161* 112*    Liver Enzymes Recent Labs  Lab 10/16/22 2245 10/17/22 0453  AST 31 22  ALT 18 16  ALKPHOS 82 58  BILITOT 0.7 0.9  ALBUMIN 4.5 3.8    Cardiac Enzymes No results for input(s): "TROPONINI", "PROBNP" in the last 168 hours.  Glucose Recent Labs  Lab 10/17/22 0112  GLUCAP 97    Imaging DG Chest Port 1 View  Result Date: 10/16/2022 CLINICAL DATA:  Questionable sepsis - evaluate for abnormality EXAM: PORTABLE CHEST 1 VIEW COMPARISON:  Radiograph head CT 05/31/2022 FINDINGS: Tracheostomy tube tip at the thoracic inlet. Stable cardiomegaly peer again seen vascular congestion. There is superimposed ill-defined bilateral perihilar opacities. No large pleural effusion. No pneumothorax. Detailed assessment is  limited by portable technique and soft tissue attenuation from habitus. IMPRESSION: 1. Ill-defined bilateral perihilar opacities, may represent pulmonary edema or infection. 2. Stable cardiomegaly and vascular congestion. 3. Tracheostomy tube tip at the thoracic inlet. Electronically Signed   By: 08/01/2022 M.D.   On: 10/16/2022 23:22     STUDIES: none  CULTURES:  Blood cultures x2: 10/16/22> ANTIBIOTICS: Vancomycin Cefepime  SIGNIFICANT EVENTS: 10/16/2022: ED with acute respiratory distress 10/17/2022: Admitted to the ICU on the vent  LINES/TUBES: Peripheral IVs  DISCUSSION: This is a 46 year old female admitted with acute on chronic hypercarbic respiratory failure and sepsis secondary to pneumonia.    ASSESSMENT / PLAN:  PULMONARY A: Acute on chronic hypercarbic respiratory failure Bacteriuria pneumonia Obesity hypoventilation syndrome s/p tracheostomy History of asthma P:   Full vent support with current settings: PRVC, tidal volume 6 mL kilogram /IBW, FiO2 100% PEEP 5 Wean FiO2 as tolerated ABG and chest x-ray as needed  Pulmonary hygiene Inhaled steroids and bronchodilators Broad-spectrum antibiotics and follow-up on cultures and adjust antibiotics as needed CARDIOVASCULAR A:  Hypertension P:  Resume home medications once nasogastric tube is placed Trend blood pressure  RENAL A:   Acute on chronic renal failure Mild hyperkalemia P:   Creatinine is trending up: 1.27 upon ED presentation to 1.57 now and potassium is up to 5.2.  From 4.0 upon admission Continue to monitor Consider gentle hydration if creatinine continues to trend up Avoid nephrotoxic medications  GASTROINTESTINAL A:   History of GERD P:   Protonix 40 mg IV daily  INFECTIOUS A:   Possible bacterial pneumonia P:   Follow-up cultures Broad-spectrum antibiotics as ordered Trend procalcitonin and lactic acid  ENDOCRINE A:   History of PE prediabetes P:   Patient's blood sugar  was 250 mg/dL upon presentation We will trend blood sugar levels Sliding-scale insulin coverage with blood glucose monitoring every 4 hours  NEUROLOGIC A:   Acute encephalopathy due to hypercapnia P:   RASS goal: 0 to -1 Gentle sedation with as needed fentanyl 25 to 50 mcg every hour as needed for vent discomfort Continue full vent support with current settings and monitor PCO2 levels   Best Practice: Code Status: Full code Diet: N.p.o. GI prophylaxis: Protonix 40 mg daily VTE prophylaxis: Subcu heparin  FAMILY  - Updates: Family at bedside sleeping.  We will have to dayshift update patient's family  Delania Ferg S. [facility] ANP-BC Pulmonary and Critical Care Medicine [facility] Pager [phone] or [phone]  NB: This document was prepared using Dragon voice recognition software and may include unintentional dictation errors.    10/17/2022, 6:33 AM Chest x-ray:

## 2022-10-17 NOTE — Progress Notes (Addendum)
Pharmacy Antibiotic Note  Sandra Holloway is a 46 y.o. female admitted on 10/16/2022 with infection of unknown source.  Pharmacy has been consulted for Cefepime & Vancomycin dosing.  Plan: Cefepime 2 gm q8hr per indication & renal fxn.  Pt given Vancomycin 1000 mg once. Vancomycin 1000 mg once, following by Vancomycin 750 IV Q 12 hrs. Goal AUC 400-550. Expected AUC: 487.7 SCr used: 1.53, Vd used: 0.5, BMI: 71.6  Pharmacy will continue to follow and will adjust abx dosing whenever warranted.  Temp (24hrs), Avg:98 F (36.7 C), Min:98 F (36.7 C), Max:98 F (36.7 C)   Recent Labs  Lab 10/16/22 2245 10/17/22 0126  WBC 21.5*  --   CREATININE 1.27*  --   LATICACIDVEN 5.9* 1.3    Estimated Creatinine Clearance: 88.5 mL/min (A) (by C-G formula based on SCr of 1.27 mg/dL (H)).    No Known Allergies  Antimicrobials this admission: 11/18 Flagyl >> x 1 dose 11/18 Cefepime >>  11/18 Vancomycin >>   Microbiology results: 11/18 BCx: Pending  Thank you for allowing pharmacy to be a part of this patient's care.  Otelia Sergeant, PharmD, [facility] 10/17/2022 4:56 AM

## 2022-10-17 NOTE — Progress Notes (Signed)
Patient's daughters at bedside, educated patient and family that patient cannot have anything to eat or drink, not even water, due to the tracheostomy tube in place. I educated them that once she is able to breathe without the ventilator, diet and liquids can be started. Both daughters and the patient affirmed that they understood why she could not have anything to eat or drink.

## 2022-10-17 NOTE — Progress Notes (Signed)
Placed patient on vent for rest. Inner cannula changed. Patient with productive cough. Patient tolerating well. Will continue to monitor.

## 2022-10-17 NOTE — Progress Notes (Addendum)
eLink Physician-Brief Progress Note Patient Name: Kirra Verga DOB: May 18, 1976 MRN: [id number]   Date of Service  10/17/2022  HPI/Events of Note  46/F with chronic respiratory failure, s/p tracheostomy, who presents with respiratory distress. In the ED, pt was reported to have thick secretions per tracheostomy tube, and had required bagging for severe hypoxemia.  In the ED, CXR done showed bilateral patch opacities, concerning for pneumonia. She was started on empiric antibiotics and admitted to the ICU.   eICU Interventions  Acute on chronic Hypoxemic and hypercapnic respiratory failure - Tracheostomy  connected to ventilator.     - Will maintain on low tidal volume ventilation strategy. Target plateau pressures <30    - Titrate FiO2 and PEEP to target SpO2 >90%    - Serial ABG, will make further vent setting changes as warranted - Will place on duonebs, systemic steroids.  - Started on empiric antibiotic therapy.  - Will follow cultures and deescalate as warranted - Trend WBC, lactate, temperature curve - Pt not requiring vasopressor support at this time - Monitor I/Os, daily weights - Would try to avoid volume overload states.          Sowmya Partridge M DELA CRUZ 10/17/2022, 1:13 AM

## 2022-10-17 NOTE — Progress Notes (Signed)
PHARMACY - PHYSICIAN COMMUNICATION CRITICAL VALUE ALERT - BLOOD CULTURE IDENTIFICATION (BCID)  Sandra Holloway is an 46 y.o. female who presented to [facility] on 10/16/2022 with a chief complaint of Acute Respiratory Failure  Assessment:  2 out of 4 bottles (aerobic and anaerobic from the same set) positive for Staph epi, mec A/C detected   Name of physician (or Provider) Contacted: Dr. Dorene Grebe  Current antibiotics: azithromycin, cefepime  Changes to prescribed antibiotics recommended:  Likely contaminant, Dr. Dorene Grebe declined adding vancomycin.    Manfred Shirts 10/17/2022  4:58 PM

## 2022-10-17 NOTE — ED Notes (Signed)
Critical value of lactic - 5.9 called to ICU NP "Magdalene."

## 2022-10-17 NOTE — Progress Notes (Signed)
PHARMACY - PHYSICIAN COMMUNICATION CRITICAL VALUE ALERT - BLOOD CULTURE IDENTIFICATION (BCID)  Sandra Holloway is an 46 y.o. female who presented to [facility] on 10/16/2022 with a chief complaint of Acute Respiratory Failure  Assessment:  2 out of 4 bottles (aerobic and anaerobic from the same set) positive for Staph epi, mec A/C detected  Name of physician (or Provider) Contacted: Dr. Dorene Grebe  Current antibiotics: azithromycin, cefepime  Changes to prescribed antibiotics recommended:  Likely contaminant, Dr. Dorene Grebe declined adding vancomycin.   Addendum: Update- 3/4 bottles growing Staph epi. Restarting vancomycin per MD.    [name] 10/17/2022  5:24 PM

## 2022-10-17 NOTE — Progress Notes (Signed)
Patient able to  self suction and perform trach care. Patient asked to let her provide self care. Good technique noted. SVN treatment given. Patient remains on trach collar for now. Willing to go back on vent tonight.

## 2022-10-18 ENCOUNTER — Inpatient Hospital Stay (HOSPITAL_COMMUNITY)
Admit: 2022-10-18 | Discharge: 2022-10-18 | Disposition: A | Discharge status: Home / Self Care | Payer: Medicaid Other | Attending: Pulmonary Disease | Admitting: Pulmonary Disease

## 2022-10-18 DIAGNOSIS — J386 Stenosis of larynx: Secondary | ICD-10-CM

## 2022-10-18 DIAGNOSIS — Z430 Encounter for attention to tracheostomy: Secondary | ICD-10-CM

## 2022-10-18 DIAGNOSIS — I5031 Acute diastolic (congestive) heart failure: Secondary | ICD-10-CM

## 2022-10-18 DIAGNOSIS — J189 Pneumonia, unspecified organism: Secondary | ICD-10-CM | POA: Diagnosis not present

## 2022-10-18 DIAGNOSIS — N179 Acute kidney failure, unspecified: Secondary | ICD-10-CM | POA: Diagnosis not present

## 2022-10-18 DIAGNOSIS — J9601 Acute respiratory failure with hypoxia: Secondary | ICD-10-CM | POA: Diagnosis not present

## 2022-10-18 LAB — BASIC METABOLIC PANEL
Anion gap: 8 (ref 5–15)
BUN: 32 mg/dL — ABNORMAL HIGH (ref 6–20)
CO2: 28 mmol/L (ref 22–32)
Calcium: 9.2 mg/dL (ref 8.9–10.3)
Chloride: 103 mmol/L (ref 98–111)
Creatinine, Ser: 1.82 mg/dL — ABNORMAL HIGH (ref 0.44–1.00)
GFR, Estimated: 34 mL/min — ABNORMAL LOW (ref >60)
Glucose, Bld: 150 mg/dL — ABNORMAL HIGH (ref 70–99)
Potassium: 4.8 mmol/L (ref 3.5–5.1)
Sodium: 139 mmol/L (ref 135–145)

## 2022-10-18 LAB — ECHOCARDIOGRAM COMPLETE
Height: 62 in
S' Lateral: 3.1 cm
Weight: 6525.62 oz

## 2022-10-18 LAB — PHOSPHORUS: Phosphorus: 5.1 mg/dL — ABNORMAL HIGH (ref 2.5–4.6)

## 2022-10-18 LAB — GLUCOSE, CAPILLARY
Glucose-Capillary: 132 mg/dL — ABNORMAL HIGH (ref 70–99)
Glucose-Capillary: 141 mg/dL — ABNORMAL HIGH (ref 70–99)
Glucose-Capillary: 162 mg/dL — ABNORMAL HIGH (ref 70–99)

## 2022-10-18 LAB — HEMOGLOBIN A1C
Hgb A1c MFr Bld: 6 % — ABNORMAL HIGH (ref 4.8–5.6)
Mean Plasma Glucose: 126 mg/dL

## 2022-10-18 LAB — MAGNESIUM: Magnesium: 2.1 mg/dL (ref 1.7–2.4)

## 2022-10-18 MED ORDER — IPRATROPIUM-ALBUTEROL 0.5-2.5 (3) MG/3ML IN SOLN
3.0000 mL | Freq: Four times a day (QID) | RESPIRATORY_TRACT | Status: DC
  Administered 2022-10-19 (×2): 3 mL via RESPIRATORY_TRACT
  Filled 2022-10-18 (×2): qty 3

## 2022-10-18 MED ORDER — CYCLOBENZAPRINE HCL 10 MG PO TABS: 5.0000 mg | ORAL_TABLET | Freq: Every evening | ORAL | Status: DC | PRN

## 2022-10-18 MED ORDER — SODIUM CHLORIDE 0.9 % IV SOLN
2.0000 g | INTRAVENOUS | Status: DC
  Administered 2022-10-18: 2 g via INTRAVENOUS
  Filled 2022-10-18: qty 20
  Filled 2022-10-18: qty 2

## 2022-10-18 MED ORDER — INSULIN ASPART 100 UNIT/ML IJ SOLN: 0.0000 [IU] | Freq: Three times a day (TID) | INTRAMUSCULAR | Status: DC

## 2022-10-18 NOTE — Progress Notes (Signed)
NAME:  Sandra Holloway, MRN:  [id number], DOB:  1976-04-06, LOS: 1 ADMISSION DATE:  10/16/2022, CHIEF COMPLAINT:  Respiratory Failure   History of Present Illness:   46 year old African-American female with a history of morbid obesity, asthma, CKD stage II, hypertension, GERD, sleep apnea, and obesity hypoventilation syndrome s/p tracheostomy who presented to the ED via EMS with complaints of dyspnea, altered mental status and agitation.  When EMS arrived, patient was struggling to breathe and was hypoxic.  EMS suctioned out thick secretions from her trach and started BMV on route to the ED.  Upon arrival in the ED, patient continued to be agitated, and hypoxic hence she was placed on the vent.  Her ED work-up showed a lactic acid of 5.9, WBC of 21.5, blood glucose of 250 mg/dL, creatinine of 4.17 and chest x-ray showed bilateral perihilar opacities, cardiomegaly and vascular congestion.  Initial blood gas showed a pH of 7.13, PCO2 greater than 70 and a PaO2 of 161.  She was admitted to the ICU for further management of acute on chronic hypercarbic respiratory failure secondary to pneumonia and mucus plugging.  Pertinent  Medical History   -Tracheostomy in 2016, q49month tracheostomy change (Shiley 6.0 distal XLT) -Subglottic stenosis  Significant Hospital Events: Including procedures, antibiotic start and stop dates in addition to other pertinent events   10/17/2022: admit to ICU, ventilated 10/18/2022: trachcollar  Interim History / Subjective:  Feels well today, with minimal shortness of breath.  Objective   Blood pressure (!) 150/78, pulse 73, temperature 98.1 F (36.7 C), temperature source Oral, resp. rate 16, height 5\' 2"  (1.575 m), weight (!) 185 kg, last menstrual period 09/02/2022, SpO2 96 %.    Vent Mode: PRVC FiO2 (%):  [40 %-50 %] 40 % Set Rate:  [16 bmp] 16 bmp Vt Set:  [450 mL] 450 mL PEEP:  [5 cmH20] 5 cmH20   Intake/Output Summary (Last 24 hours) at 10/18/2022  1441 Last data filed at 10/18/2022 1345 Gross per 24 hour  Intake 1618.22 ml  Output 4200 ml  Net -2581.78 ml   Filed Weights   10/16/22 2249 10/17/22 0325 10/18/22 0500  Weight: (!) 178 kg (!) 185.4 kg (!) 185 kg    Examination: Physical Exam Constitutional:      General: She is not in acute distress.    Appearance: She is obese. She is not ill-appearing.  HENT:     Head: Normocephalic.     Mouth/Throat:     Mouth: Mucous membranes are moist.  Eyes:     Pupils: Pupils are equal, round, and reactive to light.  Neck:     Comments: Tracheostomy tube in place Cardiovascular:     Rate and Rhythm: Normal rate and regular rhythm.     Heart sounds: Normal heart sounds.  Pulmonary:     Effort: Pulmonary effort is normal.     Breath sounds: Normal breath sounds. No wheezing.  Abdominal:     Palpations: Abdomen is soft.  Skin:    General: Skin is warm.  Neurological:     General: No focal deficit present.     Mental Status: She is alert and oriented to person, place, and time.     Assessment & Plan:   46 year old female with a history of chronic tracheostomy (reported OSA, subglottic stenosis) presents with respiratory failure secondary to mucus plugging.  Neurology Mental status at baseline, no active issues  Respiratory #Acute Hypoxic and Hypercarbic Respiratory Failure #Chronic Tracheostomy (Shiley 6.0 XLT distal) #Subglottic  stenosis #Mucus Plugging #CAP  Broad spectrum antibiotics given mucus plugging and increased secretions. Patient has improved following brief stint on the ventilator and has been successfully weaned this morning. She is now on trach collar through the tracheostomy tube and requires ICU level of care for airway watch given subglotic stenosis and inability to establish airway should she plug the tracheostomy tube. Review of BMP does not suggest chronic hypercapnia, so will hold off on nocturnal ventilation at the moment (OHS bypassed with  tracheostomy). Patient to have a spare tracheostomy tube at the bedside.  Cardiovascular #HTN  Mildly hypertensive. Will continue to monitor and restart home anti-hypertensive agents.  Renal #AKI  Acute on chronic kidney injury that is now trending down. Continue to hold nephrotoxic medications. Will hold furosemide today  GI  Regular diet. Patient not in need of prophylaxis.  Hem/Onc  Heparin for prophylaxis  ID #CAP  Mucus plugging concerning for CAP. Will treat with Ceftriaxone and Azithromycin  Best Practice (right click and "Reselect all SmartList Selections" daily)   Diet/type: Regular consistency (see orders) DVT prophylaxis: prophylactic heparin  GI prophylaxis: N/A Lines: N/A Foley:  N/A Code Status:  full code Last date of multidisciplinary goals of care discussion [10/18/2022]  Labs   CBC: Recent Labs  Lab 10/16/22 2245 10/17/22 0453 10/17/22 1818  WBC 21.5* 15.9* 14.2*  NEUTROABS 9.3*  --  11.8*  HGB 13.6 11.8* 11.1*  HCT 44.6 37.3 34.9*  MCV 87.8 85.9 85.5  PLT 316 219 221    Basic Metabolic Panel: Recent Labs  Lab 10/16/22 2245 10/17/22 0453 10/17/22 2027 10/18/22 0504  NA 140 139 139 139  K 4.0 5.2* 4.5 4.8  CL 101 104 101 103  CO2 26 28 29 28   GLUCOSE 250* 131* 145* 150*  BUN 17 19 27* 32*  CREATININE 1.27* 1.53* 1.99* 1.82*  CALCIUM 9.3 8.8* 8.8* 9.2  MG  --  1.9  --  2.1  PHOS  --   --   --  5.1*   GFR: Estimated Creatinine Clearance: 63.5 mL/min (A) (by C-G formula based on SCr of 1.82 mg/dL (H)). Recent Labs  Lab 10/16/22 2245 10/17/22 0126 10/17/22 0453 10/17/22 1818  WBC 21.5*  --  15.9* 14.2*  LATICACIDVEN 5.9* 1.3  --   --     Liver Function Tests: Recent Labs  Lab 10/16/22 2245 10/17/22 0453  AST 31 22  ALT 18 16  ALKPHOS 82 58  BILITOT 0.7 0.9  PROT 9.1* 7.5  ALBUMIN 4.5 3.8   No results for input(s): "LIPASE", "AMYLASE" in the last 168 hours. No results for input(s): "AMMONIA" in the last 168  hours.  ABG    Component Value Date/Time   PHART 7.33 (L) 10/17/2022 1438   PCO2ART 60 (H) 10/17/2022 1438   PO2ART 87 10/17/2022 1438   HCO3 31.6 (H) 10/17/2022 1438   ACIDBASEDEF 4.5 (H) 10/16/2022 2251   O2SAT 98 10/17/2022 1438     Coagulation Profile: No results for input(s): "INR", "PROTIME" in the last 168 hours.  Cardiac Enzymes: No results for input(s): "CKTOTAL", "CKMB", "CKMBINDEX", "TROPONINI" in the last 168 hours.  HbA1C: Hgb A1c MFr Bld  Date/Time Value Ref Range Status  10/17/2022 04:53 AM 6.0 (H) 4.8 - 5.6 % Final    Comment:    (NOTE)         Prediabetes: 5.7 - 6.4         Diabetes: >6.4         Glycemic  control for adults with diabetes: <7.0     CBG: Recent Labs  Lab 10/17/22 1617 10/17/22 2028 10/17/22 2326 10/18/22 0349 10/18/22 0741  GLUCAP 145* 148* 113* 132* 141*    Past Medical History:  She,  has a past medical history of Dyspnea and Hypertension.   Surgical History:   Past Surgical History:  Procedure Laterality Date   TRACHEOSTOMY       Social History:   reports that she does not currently use alcohol. She reports that she does not currently use drugs.   Family History:  Her family history is not on file.   Allergies No Known Allergies   Home Medications  Prior to Admission medications   Medication Sig Start Date End Date Taking? Authorizing Provider  acetaminophen (TYLENOL) 325 MG tablet Take 2 tablets (650 mg total) by mouth every 6 (six) hours as needed for mild pain (or Fever >/= 101). 03/30/22   Lurene Shadow, MD  cefadroxil (DURICEF) 500 MG capsule Take 1 capsule (500 mg total) by mouth 2 (two) times daily. Patient not taking: Reported on 05/31/2022 03/30/22   Lurene Shadow, MD  DULoxetine (CYMBALTA) 30 MG capsule Take 30 mg by mouth daily. 03/17/22   [provider]  furosemide (LASIX) 40 MG tablet Take 40 mg by mouth daily.    [provider]  loratadine (CLARITIN) 10 MG tablet Take 10 mg by mouth  daily.    [provider]  Multiple Vitamin (MULTIVITAMIN WITH MINERALS) TABS tablet Take 1 tablet by mouth daily.    [provider]  omeprazole (PRILOSEC) 20 MG capsule Take 20 mg by mouth daily.    [provider]  oxyCODONE-acetaminophen (PERCOCET) 10-325 MG tablet Take 1 tablet by mouth every 6 (six) hours. Patient not taking: Reported on 05/31/2022 03/17/22   [provider]  oxyCODONE-acetaminophen (PERCOCET) 5-325 MG tablet Take 1 tablet by mouth every 4 (four) hours as needed for severe pain. 09/29/22 09/29/23  Willy Eddy, MD     Critical care time: 53 minutes

## 2022-10-18 NOTE — Progress Notes (Signed)
Chronic Muscle Spasms lower back  Patient complaining of 7/10 muscle spasms in lower back that are shooting down her legs. This has been an ongoing chronic problem and she was prescribed Flexeril 5 mg QHS PRN which she has been taking over the past month. She denies any side effects with this medication. - Flexeril 5 mg ordered QHS PRN  Suspected Pre-diabetes Patient has been refusing insulin SQ stating she is not diabetic. Appropriate education provided - Q 4 checks and coverage changed to AC,HS to monitor CBG during hospitalization - diabetes coordinator consulted    Cheryll Cockayne Rust-Chester, AGACNP-BC Acute Care Nurse Practitioner [facility]   [phone] / [phone] Please see Amion for pager details.

## 2022-10-18 NOTE — Progress Notes (Signed)
*  PRELIMINARY RESULTS* Echocardiogram 2D Echocardiogram has been performed.  Cristela Blue 10/18/2022, 9:12 AM

## 2022-10-18 NOTE — Consult Note (Signed)
PHARMACY CONSULT NOTE  Pharmacy Consult for Electrolyte Monitoring and Replacement   Recent Labs: Potassium (mmol/L)  Date Value  10/18/2022 4.8   Magnesium (mg/dL)  Date Value  25/95/6387 2.1   Calcium (mg/dL)  Date Value  56/43/3295 9.2   Albumin (g/dL)  Date Value  18/84/1660 3.8   Phosphorus (mg/dL)  Date Value  63/11/6008 5.1 (H)   Sodium (mmol/L)  Date Value  10/18/2022 139   Assessment: Patient is a 46 y/o F with medical history including chronic respiratory failure with chronic tracheostomy, morbid obesity, asthma, CKD, HTN, GERD, OSA, obesity hypoventilation syndrome who is admitted with acute on chronic respiratory failure. Patient has been placed on ventilator. Pharmacy consulted to assist with electrolyte monitoring and replacement as indicated.  Scr trending up (1.27 >> 1.53)  Goal of Therapy:  Electrolytes within normal limits  Plan:  Scr 1.99>1.82; UOP 0.53ml/k/h; Net I/O -1.4L K 5.2> WNL x2: continues on IV Lasix 40 mg daily Phos 5.1 - starting PO intake w/ oral feeds today. Mg 2.1 - WNL All other lytes WNL, no repletion at this time. Follow-up electrolytes with AM labs tomorrow  Martyn Malay 10/18/2022 9:55 AM

## 2022-10-18 NOTE — Progress Notes (Signed)
Patient remains on trach collar in no distress. Patient is able to do on self trach care and well as suctioning when she needs such. RT on sb for assistance. Will continue to monitor

## 2022-10-19 DIAGNOSIS — J386 Stenosis of larynx: Secondary | ICD-10-CM

## 2022-10-19 DIAGNOSIS — R7881 Bacteremia: Secondary | ICD-10-CM | POA: Diagnosis not present

## 2022-10-19 DIAGNOSIS — J189 Pneumonia, unspecified organism: Secondary | ICD-10-CM | POA: Diagnosis not present

## 2022-10-19 LAB — BASIC METABOLIC PANEL
Anion gap: 8 (ref 5–15)
BUN: 37 mg/dL — ABNORMAL HIGH (ref 6–20)
CO2: 31 mmol/L (ref 22–32)
Calcium: 9.4 mg/dL (ref 8.9–10.3)
Chloride: 102 mmol/L (ref 98–111)
Creatinine, Ser: 1.68 mg/dL — ABNORMAL HIGH (ref 0.44–1.00)
GFR, Estimated: 38 mL/min — ABNORMAL LOW (ref >60)
Glucose, Bld: 117 mg/dL — ABNORMAL HIGH (ref 70–99)
Potassium: 4.7 mmol/L (ref 3.5–5.1)
Sodium: 141 mmol/L (ref 135–145)

## 2022-10-19 LAB — CBC
HCT: 34.6 % — ABNORMAL LOW (ref 36.0–46.0)
Hemoglobin: 10.8 g/dL — ABNORMAL LOW (ref 12.0–15.0)
MCH: 27 pg (ref 26.0–34.0)
MCHC: 31.2 g/dL (ref 30.0–36.0)
MCV: 86.5 fL (ref 80.0–100.0)
Platelets: 233 10*3/uL (ref 150–400)
RBC: 4 MIL/uL (ref 3.87–5.11)
RDW: 15.5 % (ref 11.5–15.5)
WBC: 12.2 10*3/uL — ABNORMAL HIGH (ref 4.0–10.5)
nRBC: 0 % (ref 0.0–0.2)

## 2022-10-19 LAB — BLOOD GAS, ARTERIAL
Acid-Base Excess: 4 mmol/L — ABNORMAL HIGH (ref 0.0–2.0)
Acid-base deficit: 4.5 mmol/L — ABNORMAL HIGH (ref 0.0–2.0)
Bicarbonate: 26.6 mmol/L (ref 20.0–28.0)
Bicarbonate: 31.6 mmol/L — ABNORMAL HIGH (ref 20.0–28.0)
FIO2: 80 %
FIO2: 50 %
MECHVT: 500 mL
O2 Saturation: 98 %
PEEP: 5 cmH2O
Patient temperature: 37
Patient temperature: 37
RATE: 16 resp/min
pCO2 arterial: 60 mmHg — ABNORMAL HIGH (ref 32–48)
pH, Arterial: 7.13 — CL (ref 7.35–7.45)
pH, Arterial: 7.33 — ABNORMAL LOW (ref 7.35–7.45)
pO2, Arterial: 161 mmHg — ABNORMAL HIGH (ref 83–108)
pO2, Arterial: 87 mmHg (ref 83–108)

## 2022-10-19 LAB — CULTURE, BLOOD (ROUTINE X 2): Special Requests: ADEQUATE

## 2022-10-19 LAB — GLUCOSE, CAPILLARY
Glucose-Capillary: 110 mg/dL — ABNORMAL HIGH (ref 70–99)
Glucose-Capillary: 143 mg/dL — ABNORMAL HIGH (ref 70–99)

## 2022-10-19 LAB — MAGNESIUM: Magnesium: 2.3 mg/dL (ref 1.7–2.4)

## 2022-10-19 LAB — LEGIONELLA PNEUMOPHILA SEROGP 1 UR AG: L. pneumophila Serogp 1 Ur Ag: NEGATIVE

## 2022-10-19 LAB — PHOSPHORUS: Phosphorus: 4.9 mg/dL — ABNORMAL HIGH (ref 2.5–4.6)

## 2022-10-19 MED ORDER — VANCOMYCIN HCL 1500 MG/300ML IV SOLN
1500.0000 mg | INTRAVENOUS | Status: DC
  Administered 2022-10-19: 1500 mg via INTRAVENOUS
  Filled 2022-10-19: qty 300

## 2022-10-19 MED ORDER — PANTOPRAZOLE SODIUM 40 MG PO TBEC
40.0000 mg | DELAYED_RELEASE_TABLET | Freq: Every day | ORAL | Status: DC
  Administered 2022-10-19 – 2022-10-21 (×3): 40 mg via ORAL
  Filled 2022-10-19 (×3): qty 1

## 2022-10-19 MED ORDER — SODIUM CHLORIDE 0.9 % IV SOLN
2.0000 g | INTRAVENOUS | Status: DC
  Administered 2022-10-19 – 2022-10-20 (×2): 2 g via INTRAVENOUS
  Filled 2022-10-19 (×4): qty 20

## 2022-10-19 MED ORDER — CEFPODOXIME PROXETIL 200 MG PO TABS
200.0000 mg | ORAL_TABLET | Freq: Two times a day (BID) | ORAL | Status: DC
  Administered 2022-10-19: 200 mg via ORAL
  Filled 2022-10-19 (×2): qty 1

## 2022-10-19 MED ORDER — AZITHROMYCIN 250 MG PO TABS
500.0000 mg | ORAL_TABLET | Freq: Every day | ORAL | Status: DC
  Administered 2022-10-19: 500 mg via ORAL
  Filled 2022-10-19: qty 1

## 2022-10-19 MED ORDER — ENSURE MAX PROTEIN PO LIQD
11.0000 [oz_av] | Freq: Two times a day (BID) | ORAL | Status: DC
  Administered 2022-10-19 – 2022-10-21 (×4): 11 [oz_av] via ORAL

## 2022-10-19 MED ORDER — IPRATROPIUM-ALBUTEROL 0.5-2.5 (3) MG/3ML IN SOLN: 3.0000 mL | Freq: Four times a day (QID) | RESPIRATORY_TRACT | Status: DC | PRN

## 2022-10-19 NOTE — Consult Note (Signed)
[facility]                                                                                        Patient Identification: Patient Name: Sandra Holloway MRN: [id number] Admit Date: 10/16/2022 10:35 PM Today's Date: 10/19/2022 Reason for consult: staph bacteremia  Requesting provider: Lianne Bushy Dgayli  Principal Problem:   Acute respiratory failure [facility]) Active Problems:   Pneumonia due to infectious organism  Unable to access caregility due to cart not working. Recommendations are solely based on chart review   Antibiotics:  Vancomycin 11/18-c Cefepime 11/18-11/19, ceftriaxone 11/20 Azithromycin 11/19-c  Lines/Hardware: do not see any CVC or hardwares per EMR  Assessment 46 year old female with a PMH chronic respiratory failure status post tracheostomy, morbid obesity/OSA, asthma , CKD , hypertension, GERD who presented to the ED 11/18 with respiratory distress.  Noted to have thick secretions coming out of her trach with concerns for mucous plugging.  Chest x-ray with bilateral patchy opacities concerning for pneumonia.  ID engaged for positive blood cultures.   Unclear significance of staph epidermidis in one set and staph epidermidis in another set, both from same site and same time of draw  Recommendations  Continue vancomycin, pharmacy to dose, pending sensi of Staph lugdunensis Continue ceftriaoxne for covering PNA Less likely atypica pna and can stop azithromycin from tomorrow Fu repeat blood cx and TTE  D/w primary team and ID Pharm D Dustin  Following remotely   Rest of the management as per the primary team. Please call with questions or concerns.  Thank you for the consult  Odette Fraction, MD Infectious Disease Physician [facility] [address] [place], [place] [address] Phone: [phone]  Fax:  [phone]  __________________________________________________________________________________________________________ HPI and Hospital Course: 46 year old female with a PMH chronic respiratory failure status post tracheostomy, morbid obesity/OSA, asthma , CKD , hypertension, GERD who presented to the ED 11/18 with respiratory distress.  Per EMS, she had thick secretion coming out of her trach and they started bagging the patient.  SpO2 on arrival in 70s. she was altered and agitated and was placed on full vent support patient has had multiple admissions for sepsis and respiratory failure in the past.  Chest x-ray with bilateral patchy opacities concerning for pneumonia.  Started on empiric broad-spectrum antibiotics and admitted to ICU.  At ED, workup remarkable for being afebrile Labs remarkable for CR 1.27, lactic acid 5.9, WBC 21.5 ABG with pH 7.13, pCO2 greater than 70, PaO2 161 11/18 blood cultures, both sets from right arm 2251 hrs Staph epidermidis in 1 set and staph lugdunensis and another set  Past Medical History:  Diagnosis Date   Dyspnea    Hypertension    Past Surgical History:  Procedure Laterality Date   TRACHEOSTOMY      Scheduled Meds:  Chlorhexidine Gluconate Cloth  6 each Topical Daily   feeding supplement  237 mL Oral Q24H   heparin  5,000 Units Subcutaneous Q8H   insulin aspart  0-15 Units Subcutaneous TID AC & HS   ipratropium-albuterol  3 mL Nebulization Q6H   multivitamin  with minerals  1 tablet Oral Daily   pantoprazole (PROTONIX) IV  40 mg Intravenous Q24H   Ensure Max Protein  11 oz Oral Daily   Continuous Infusions:  azithromycin Stopped (10/18/22 1826)   cefTRIAXone (ROCEPHIN)  IV Stopped (10/18/22 1914)   PRN Meds:.cyclobenzaprine, docusate sodium, LORazepam, oxyCODONE-acetaminophen, polyethylene glycol  No Known Allergies  Social History   Socioeconomic History   Marital status: Single    Spouse name: Not on file   Number of children: Not  on file   Years of education: Not on file   Highest education level: Not on file  Occupational History   Not on file  Tobacco Use   Smoking status: Unknown   Smokeless tobacco: Not on file  Substance and Sexual Activity   Alcohol use: Not Currently   Drug use: Not Currently   Sexual activity: Not Currently  Other Topics Concern   Not on file  Social History Narrative   Not on file   Social Determinants of Health   Financial Resource Strain: Not on file  Food Insecurity: Not on file  Transportation Needs: Not on file  Physical Activity: Not on file  Stress: Not on file  Social Connections: Not on file  Intimate Partner Violence: Not on file   No family history on file.   Vitals BP 126/63   Pulse 72   Temp 98.1 F (36.7 C) (Oral)   Resp (!) 21   Ht  (1.575 m)   Wt (!) 181.3 kg   LMP 09/02/2022 (Exact Date) Comment: Urine pregnancy test in ER negative  SpO2 100%   BMI 73.10 kg/m   Pertinent Microbiology Results for orders placed or performed during the hospital encounter of 10/16/22  Blood Culture (routine x 2)     Status: Abnormal (Preliminary result)   Collection Time: 10/16/22 10:51 PM   Specimen: BLOOD RIGHT ARM  Result Value Ref Range Status   Specimen Description   Final    BLOOD RIGHT ARM Performed at [facility], [address] [place], [place] [address]    Special Requests   Final    Blood Culture adequate volume Performed at [facility], [address] [place], [place] [address]    Culture  Setup Time   Final    Organism ID to follow IN BOTH AEROBIC AND ANAEROBIC BOTTLES AEROBIC BOTTLE ONLY CRITICAL RESULT CALLED TO, READ BACK BY AND VERIFIED WITH: JUSTIN MILLER 10/17/22 1615 AMK Performed at [facility], [address] [place], [place] [address]    Culture (A)  Final    STAPHYLOCOCCUS HOMINIS THE SIGNIFICANCE OF ISOLATING THIS ORGANISM FROM A SINGLE SET OF BLOOD CULTURES WHEN MULTIPLE SETS ARE DRAWN IS  UNCERTAIN. PLEASE NOTIFY THE MICROBIOLOGY DEPARTMENT WITHIN ONE WEEK IF SPECIATION AND SENSITIVITIES ARE REQUIRED. Performed at [facility], [address] [place], [place] [address]    Report Status PENDING  Incomplete  Blood Culture (routine x 2)     Status: Abnormal   Collection Time: 10/16/22 10:51 PM   Specimen: BLOOD RIGHT ARM  Result Value Ref Range Status   Specimen Description   Final    BLOOD RIGHT ARM Performed at [facility], [address] [place], [place] [address]    Special Requests   Final    Blood Culture adequate volume Performed at [facility], [address] [place], [place] [address]    Culture  Setup Time   Final    Romie Minus  POSITIVE COCCI AEROBIC BOTTLE ONLY CRITICAL RESULT CALLED TO, READ BACK BY AND VERIFIED WITH: CAROLYN CHILDS 10/17/22 1717 AMK Performed at [facility], [address] [place], [place] [address]    Culture (A)  Final    STAPHYLOCOCCUS LUGDUNENSIS THE SIGNIFICANCE OF ISOLATING THIS ORGANISM FROM A SINGLE SET OF BLOOD CULTURES WHEN MULTIPLE SETS ARE DRAWN IS UNCERTAIN. PLEASE NOTIFY THE MICROBIOLOGY DEPARTMENT WITHIN ONE WEEK IF SPECIATION AND SENSITIVITIES ARE REQUIRED. Performed at [facility], [address] [place], [place] [address]    Report Status 10/19/2022 FINAL  Final  Resp Panel by RT-PCR (Flu A&B, Covid) Anterior Nasal Swab     Status: None   Collection Time: 10/16/22 10:51 PM   Specimen: Anterior Nasal Swab  Result Value Ref Range Status   SARS Coronavirus 2 by RT PCR NEGATIVE NEGATIVE Final    Comment: (NOTE) SARS-CoV-2 target nucleic acids are NOT DETECTED.  The SARS-CoV-2 RNA is generally detectable in upper respiratory specimens during the acute phase of infection. The lowest concentration of SARS-CoV-2 viral copies this assay can detect is 138 copies/mL. A negative result does not preclude SARS-Cov-2 infection and should not be used as the sole basis for treatment  or other patient management decisions. A negative result may occur with  improper specimen collection/handling, submission of specimen other than nasopharyngeal swab, presence of viral mutation(s) within the areas targeted by this assay, and inadequate number of viral copies(<138 copies/mL). A negative result must be combined with clinical observations, patient history, and epidemiological information. The expected result is Negative.  Fact Sheet for Patients:  BloggerCourse.com  Fact Sheet for Healthcare Providers:  SeriousBroker.it  This test is no t yet approved or cleared by the [place] FDA and  has been authorized for detection and/or diagnosis of SARS-CoV-2 by FDA under an Emergency Use Authorization (EUA). This EUA will remain  in effect (meaning this test can be used) for the duration of the COVID-19 declaration under Section 564(b)(1) of the Act, 21 U.S.C.section 360bbb-3(b)(1), unless the authorization is terminated  or revoked sooner.       Influenza A by PCR NEGATIVE NEGATIVE Final   Influenza B by PCR NEGATIVE NEGATIVE Final    Comment: (NOTE) The Xpert Xpress SARS-CoV-2/FLU/RSV plus assay is intended as an aid in the diagnosis of influenza from Nasopharyngeal swab specimens and should not be used as a sole basis for treatment. Nasal washings and aspirates are unacceptable for Xpert Xpress SARS-CoV-2/FLU/RSV testing.  Fact Sheet for Patients: BloggerCourse.com  Fact Sheet for Healthcare Providers: SeriousBroker.it  This test is not yet approved or cleared by the [place] FDA and has been authorized for detection and/or diagnosis of SARS-CoV-2 by FDA under an Emergency Use Authorization (EUA). This EUA will remain in effect (meaning this test can be used) for the duration of the COVID-19 declaration under Section 564(b)(1) of the Act, 21 U.S.C. section  360bbb-3(b)(1), unless the authorization is terminated or revoked.  Performed at [facility], [address] [place], [place] [address]   Blood Culture ID Panel (Reflexed)     Status: Abnormal   Collection Time: 10/16/22 10:51 PM  Result Value Ref Range Status   Enterococcus faecalis NOT DETECTED NOT DETECTED Final   Enterococcus Faecium NOT DETECTED NOT DETECTED Final   Listeria monocytogenes NOT DETECTED NOT DETECTED Final   Staphylococcus species DETECTED (A) NOT DETECTED Final    Comment: CRITICAL RESULT CALLED TO, READ BACK BY AND VERIFIED WITH: JUSTIN MILLER 10/17/22 1615  Staphylococcus aureus (BCID) NOT DETECTED NOT DETECTED Final   Staphylococcus epidermidis DETECTED (A) NOT DETECTED Final    Comment: Methicillin (oxacillin) resistant coagulase negative staphylococcus. Possible blood culture contaminant (unless isolated from more than one blood culture draw or clinical case suggests pathogenicity). No antibiotic treatment is indicated for blood  culture contaminants. CRITICAL RESULT CALLED TO, READ BACK BY AND VERIFIED WITH: JUSTIN MILLER 10/17/22 1615 AMK    Staphylococcus lugdunensis NOT DETECTED NOT DETECTED Final   Streptococcus species NOT DETECTED NOT DETECTED Final   Streptococcus agalactiae NOT DETECTED NOT DETECTED Final   Streptococcus pneumoniae NOT DETECTED NOT DETECTED Final   Streptococcus pyogenes NOT DETECTED NOT DETECTED Final   A.calcoaceticus-baumannii NOT DETECTED NOT DETECTED Final   Bacteroides fragilis NOT DETECTED NOT DETECTED Final   Enterobacterales NOT DETECTED NOT DETECTED Final   Enterobacter cloacae complex NOT DETECTED NOT DETECTED Final   Escherichia coli NOT DETECTED NOT DETECTED Final   Klebsiella aerogenes NOT DETECTED NOT DETECTED Final   Klebsiella oxytoca NOT DETECTED NOT DETECTED Final   Klebsiella pneumoniae NOT DETECTED NOT DETECTED Final   Proteus species NOT DETECTED NOT DETECTED Final   Salmonella species NOT  DETECTED NOT DETECTED Final   Serratia marcescens NOT DETECTED NOT DETECTED Final   Haemophilus influenzae NOT DETECTED NOT DETECTED Final   Neisseria meningitidis NOT DETECTED NOT DETECTED Final   Pseudomonas aeruginosa NOT DETECTED NOT DETECTED Final   Stenotrophomonas maltophilia NOT DETECTED NOT DETECTED Final   Candida albicans NOT DETECTED NOT DETECTED Final   Candida auris NOT DETECTED NOT DETECTED Final   Candida glabrata NOT DETECTED NOT DETECTED Final   Candida krusei NOT DETECTED NOT DETECTED Final   Candida parapsilosis NOT DETECTED NOT DETECTED Final   Candida tropicalis NOT DETECTED NOT DETECTED Final   Cryptococcus neoformans/gattii NOT DETECTED NOT DETECTED Final   Methicillin resistance mecA/C DETECTED (A) NOT DETECTED Final    Comment: CRITICAL RESULT CALLED TO, READ BACK BY AND VERIFIED WITH: JUSTIN MILLER 10/17/22 1615 AMK Performed at [facility], [address] [place], [place] [address]   MRSA Next Gen by PCR, Nasal     Status: None   Collection Time: 10/17/22  1:54 AM   Specimen: Nasal Mucosa; Nasal Swab  Result Value Ref Range Status   MRSA by PCR Next Gen NOT DETECTED NOT DETECTED Final    Comment: (NOTE) The GeneXpert MRSA Assay (FDA approved for NASAL specimens only), is one component of a comprehensive MRSA colonization surveillance program. It is not intended to diagnose MRSA infection nor to guide or monitor treatment for MRSA infections. Test performance is not FDA approved in patients less than 44 years old. Performed at [facility], [address] [place], [place] [address]   Culture, blood (Routine X 2) w Reflex to ID Panel     Status: None (Preliminary result)   Collection Time: 10/18/22  4:36 PM   Specimen: BLOOD  Result Value Ref Range Status   Specimen Description BLOOD BLOOD LEFT HAND  Final   Special Requests   Final    BOTTLES DRAWN AEROBIC AND ANAEROBIC Blood Culture adequate volume   Culture   Final    NO  GROWTH < 24 HOURS Performed at [facility], [address] [place], [place] [address]    Report Status PENDING  Incomplete  Culture, blood (Routine X 2) w Reflex to ID Panel     Status: None (Preliminary result)   Collection Time: 10/18/22  4:46 PM  Specimen: BLOOD  Result Value Ref Range Status   Specimen Description BLOOD BLOOD RIGHT HAND  Final   Special Requests   Final    BOTTLES DRAWN AEROBIC AND ANAEROBIC Blood Culture adequate volume   Culture   Final    NO GROWTH < 24 HOURS Performed at [facility], [address] [place], [place] [address]    Report Status PENDING  Incomplete    Pertinent Lab seen by me:    Latest Ref Rng & Units 10/19/2022    3:59 AM 10/17/2022    6:18 PM 10/17/2022    4:53 AM  CBC  WBC 4.0 - 10.5 K/uL 12.2  14.2  15.9   Hemoglobin 12.0 - 15.0 g/dL 78.210.8  95.611.1  21.311.8   Hematocrit 36.0 - 46.0 % 34.6  34.9  37.3   Platelets 150 - 400 K/uL 233  221  219       Latest Ref Rng & Units 10/19/2022    3:59 AM 10/18/2022    5:04 AM 10/17/2022    8:27 PM  CMP  Glucose 70 - 99 mg/dL 086117  578150  469145   BUN 6 - 20 mg/dL 37  32  27   Creatinine 0.44 - 1.00 mg/dL 6.291.68  5.281.82  4.131.99   Sodium 135 - 145 mmol/L 141  139  139   Potassium 3.5 - 5.1 mmol/L 4.7  4.8  4.5   Chloride 98 - 111 mmol/L 102  103  101   CO2 22 - 32 mmol/L 31  28  29    Calcium 8.9 - 10.3 mg/dL 9.4  9.2  8.8      Pertinent Imagings/Other Imagings Plain films and CT images have been personally visualized and interpreted; radiology reports have been reviewed. Decision making incorporated into the Impression / Recommendations.  ECHOCARDIOGRAM COMPLETE  Result Date: 10/18/2022    ECHOCARDIOGRAM REPORT   Patient Name:   Tora DuckOMEKA Valadez Date of Exam: 10/18/2022 Medical Rec #:  [id number]      Height:       62.0 in Accession #:    [id number]     Weight:       407.8 lb Date of Birth:  [birth year]      BSA:          2.588 m Patient Age:    46 years       BP:           Not listed  in chart/Not listed in                                              chart mmHg Patient Gender: F              HR:           Not listed in chart bpm. Exam Location:  [facility] Procedure: 2D Echo, Cardiac Doppler and Color Doppler Indications:     CHF-acute diastolic I50.31  History:         Patient has no prior history of Echocardiogram examinations.                  Risk Factors:Hypertension.  Sonographer:     Cristela BlueJerry Hege Referring Phys:  [id number] HARSH CHAWLA Diagnosing Phys: Lorine BearsMuhammad Arida MD  Sonographer Comments: Technically difficult study due to poor echo windows, no apical window and no subcostal window.  The only view obtainable was parasternal. IMPRESSIONS  1. Left ventricular ejection fraction, by estimation, is 55 to 60%. The left ventricle has normal function. Left ventricular endocardial border not optimally defined to evaluate regional wall motion. Left ventricular diastolic function could not be evaluated.  2. Right ventricular systolic function is normal. The right ventricular size is normal. Tricuspid regurgitation signal is inadequate for assessing PA pressure.  3. The mitral valve is normal in structure. No evidence of mitral valve regurgitation. No evidence of mitral stenosis.  4. The aortic valve is normal in structure. Aortic valve regurgitation is not visualized. No aortic stenosis is present.  5. Technically difficult study due to poor echo windows, no apical window and no subcostal window. The only view obtainable was parasternal. FINDINGS  Left Ventricle: Left ventricular ejection fraction, by estimation, is 55 to 60%. The left ventricle has normal function. Left ventricular endocardial border not optimally defined to evaluate regional wall motion. The left ventricular internal cavity size was normal in size. There is no left ventricular hypertrophy. Left ventricular diastolic function could not be evaluated. Right Ventricle: The right ventricular size is normal. No increase in right ventricular  wall thickness. Right ventricular systolic function is normal. Tricuspid regurgitation signal is inadequate for assessing PA pressure. Left Atrium: Left atrial size was normal in size. Right Atrium: Right atrial size was normal in size. Pericardium: There is no evidence of pericardial effusion. Mitral Valve: The mitral valve is normal in structure. No evidence of mitral valve regurgitation. No evidence of mitral valve stenosis. Tricuspid Valve: The tricuspid valve is normal in structure. Tricuspid valve regurgitation is not demonstrated. No evidence of tricuspid stenosis. Aortic Valve: The aortic valve is normal in structure. Aortic valve regurgitation is not visualized. No aortic stenosis is present. Pulmonic Valve: The pulmonic valve was normal in structure. Pulmonic valve regurgitation is not visualized. No evidence of pulmonic stenosis. Aorta: The aortic root is normal in size and structure. Venous: The inferior vena cava was not well visualized. IAS/Shunts: No atrial level shunt detected by color flow Doppler.  LEFT VENTRICLE PLAX 2D LVIDd:         4.90 cm LVIDs:         3.10 cm LV PW:         1.60 cm LV IVS:        1.00 cm LVOT diam:     2.00 cm LVOT Area:     3.14 cm  LEFT ATRIUM         Index LA diam:    4.10 cm 1.58 cm/m   AORTA Ao Root diam: 3.20 cm  SHUNTS Systemic Diam: 2.00 cm Lorine Bears MD Electronically signed by Lorine Bears MD Signature Date/Time: 10/18/2022/11:10:22 AM    Final    DG Chest Port 1 View  Result Date: 10/16/2022 CLINICAL DATA:  Questionable sepsis - evaluate for abnormality EXAM: PORTABLE CHEST 1 VIEW COMPARISON:  Radiograph head CT 05/31/2022 FINDINGS: Tracheostomy tube tip at the thoracic inlet. Stable cardiomegaly peer again seen vascular congestion. There is superimposed ill-defined bilateral perihilar opacities. No large pleural effusion. No pneumothorax. Detailed assessment is limited by portable technique and soft tissue attenuation from habitus. IMPRESSION: 1.  Ill-defined bilateral perihilar opacities, may represent pulmonary edema or infection. 2. Stable cardiomegaly and vascular congestion. 3. Tracheostomy tube tip at the thoracic inlet. Electronically Signed   By: Narda Rutherford M.D.   On: 10/16/2022 23:22   CT ABDOMEN PELVIS W CONTRAST  Result Date: 09/29/2022 CLINICAL DATA:  Acute abdominal pain EXAM: CT ABDOMEN AND PELVIS WITH CONTRAST TECHNIQUE: Multidetector CT imaging of the abdomen and pelvis was performed using the standard protocol following bolus administration of intravenous contrast. RADIATION DOSE REDUCTION: This exam was performed according to the departmental dose-optimization program which includes automated exposure control, adjustment of the mA and/or kV according to patient size and/or use of iterative reconstruction technique. CONTRAST:  OMNIPAQUE IOHEXOL 350 MG/ML SOLN COMPARISON:  05/31/2022 FINDINGS: Lower chest: No acute abnormality. Hepatobiliary: No focal liver abnormality is seen. No gallstones, gallbladder wall thickening, or biliary dilatation. Pancreas: Unremarkable. No pancreatic ductal dilatation or surrounding inflammatory changes. Spleen: Normal in size without focal abnormality. Adrenals/Urinary Tract: Normal adrenal glands. Stable 2 cm exophytic hypodense lesion off the right kidney lower pole, image 31/2 remains indeterminate. Consider follow-up ultrasound for further characterization. No hydronephrosis or acute obstructive uropathy. Nonobstructing punctate nephrolithiasis in the left kidney lower pole. Ureters are symmetric and decompressed. No hydroureter or ureteral calculus. Bladder unremarkable. Stomach/Bowel: Stomach is within normal limits. Appendix appears normal. No evidence of bowel wall thickening, distention, or inflammatory changes. Vascular/Lymphatic: Aortic bifurcation atherosclerosis noted. No acute vascular process. Negative for aneurysm or dissection. No retroperitoneal hemorrhage or hematoma.  Mesenteric and renal vasculature remain patent. No veno-occlusive finding. Negative for bulky adenopathy. Reproductive: Uterus and bilateral adnexa are unremarkable. Other: No abdominopelvic ascites. Intact abdominal wall. Negative for ventral hernia. Stable skin thickening and subcutaneous edema of the lower abdominal wall pannus compatible with chronic panniculitis/cellulitis. No underlying subcutaneous abscess. Musculoskeletal: No acute osseous finding. IMPRESSION: 1. No acute intra-abdominal or pelvic finding by CT. 2. Stable 2 cm exophytic hypodense lesion off the right kidney lower pole remains indeterminate. Consider follow-up ultrasound for further characterization. 3. Punctate nonobstructing left nephrolithiasis. 4. Aortic atherosclerosis. Aortic Atherosclerosis (ICD10-I70.0). Electronically Signed   By: Judie Petit.  Shick M.D.   On: 09/29/2022 15:29    I spent 60 minutes for this patient encounter including review of prior medical records/discussing diagnostics and treatment plan with the patient/family/coordinate care with primary/other specialits with greater than 50% of time in face to face encounter.   Electronically signed by:   Odette Fraction, MD Infectious Disease Physician [facility] Pager: [phone]

## 2022-10-19 NOTE — Consult Note (Signed)
Pharmacy Antibiotic Note  Sandra Holloway is a 46 y.o. female admitted on 10/16/2022 with bacteremia.  Pharmacy has been consulted for vancomycin dosing.  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 CXR from admission shows perihilar opacities c/f pneumonia vs. pulmonary edema and Bcx growing 3/4 Staph lugdunensis, Staph hominis, and Staph epidermidis. Empirically adding vancomycin until susceptibilities for Staph lugdunensis return. Repeat Bcx have been drawn.  Vancomycin 1500 mg IV q24H Goal AUC 400-550  Est AUC: 519.2; Cmax: 31.0; Cmin: 15.1 SCr 1.68; IBW; Vd 0.5  Plan: Initiate vancomycin 1500 mg IV q24H Monitor renal function to assess for any necessary dosing changes Follow up susceptibilities and repoeat culture results  Height: 5\' 2"  (157.5 cm) Weight: (!) 181.3 kg (399 lb 11.1 oz) IBW/kg (Calculated) : 50.1  Temp (24hrs), Avg:98.2 F (36.8 C), Min:98 F (36.7 C), Max:98.4 F (36.9 C)  Recent Labs  Lab 10/16/22 2245 10/17/22 0126 10/17/22 0453 10/17/22 1818 10/17/22 2027 10/18/22 0504 10/19/22 0359  WBC 21.5*  --  15.9* 14.2*  --   --  12.2*  CREATININE 1.27*  --  1.53*  --  1.99* 1.82* 1.68*  LATICACIDVEN 5.9* 1.3  --   --   --   --   --     Estimated Creatinine Clearance: 67.8 mL/min (A) (by C-G formula based on SCr of 1.68 mg/dL (H)).    No Known Allergies  Antimicrobials this admission: 11/18 Cefepime >> 11/20 11/19 Vancomycin (first time) >> 11/20 11/19 Metronidazole x 1 11/19 Azithromycin >>  11/20 Ceftriaxone >> 11/21 Cefpodoxime x 1 11/21 Vancomycin (current) >>  Dose adjustments this admission: N/A  Microbiology results: 11/18 BCx: S. Lugdunensis, S. Hominis, S. Epidermidis 11/18 RVP: negative 11/18 MRSA PCR: negative  Thank you for allowing pharmacy to be a part of this patient's care.  12/18,  PharmD PGY-1 Pharmacy Resident 10/19/2022 2:23 PM

## 2022-10-19 NOTE — Progress Notes (Signed)
Nutrition Follow Up Note   DOCUMENTATION CODES:   Morbid obesity  INTERVENTION:   Ensure Max protein supplement BID, each supplement provides 150kcal and 30g of protein.  MVI po daily   NUTRITION DIAGNOSIS:   Inadequate oral intake related to inability to eat as evidenced by other (comment) (vent via trach requirement).  GOAL:   Patient will meet greater than or equal to 90% of their needs -met   MONITOR:   PO intake, Supplement acceptance, Labs, Weight trends, Skin, I & O's  ASSESSMENT:   46 year old female with medical history of HTN, GERD, asthma, stage 2 CKD, sleep apnea, and chronic respiratory failure/obesity-induced hypoventilation syndrome OSA, subglottic stenosis requiring chronic trach who is admitted respiratory failure secondary to mucus plugging and CAP.  Pt with good appetite and oral intake in hospital. Pt eating 100% of meals in hospital and is drinking nutritional supplements. Per chart, pt appears weight stable since admission.   Medications reviewed and include: azithromycin, heparin, insulin, MVI, protonix, ceftriaxone, vancomycin   Labs reviewed: K 4.7 wnl, BUN 37(H), creat 1.68(H), P 4.9(H), Mg 2.3 wnl Wbc- 12.2(H), Hgb 10.8(L), Hct 34.6(L) AIC 6.0(H)- 11/19  NUTRITION - FOCUSED PHYSICAL EXAM:  Flowsheet Row Most Recent Value  Orbital Region No depletion  Upper Arm Region No depletion  Thoracic and Lumbar Region No depletion  Buccal Region No depletion  Temple Region No depletion  Clavicle Bone Region No depletion  Clavicle and Acromion Bone Region No depletion  Scapular Bone Region No depletion  Dorsal Hand No depletion  Patellar Region No depletion  Anterior Thigh Region No depletion  Posterior Calf Region No depletion  Edema (RD Assessment) Moderate  Hair Reviewed  Eyes Reviewed  Mouth Reviewed  Skin Reviewed  Nails Reviewed   Diet Order:   Diet Order             Diet heart healthy/carb modified Room service appropriate? Yes;  Fluid consistency: Thin  Diet effective now                  EDUCATION NEEDS:   Not appropriate for education at this time  Skin:  Skin Assessment: Reviewed RN Assessment  Last BM:  11/20- type 5  Height:   Ht Readings from Last 1 Encounters:  10/16/22 _0  (1.575 m)    Weight:   Wt Readings from Last 1 Encounters:  10/19/22 (!) 181.3 kg   BMI:  Body mass index is 73.1 kg/m.  Estimated Nutritional Needs:   Kcal:  2400-2700kcal/day  Protein:  120-135 grams  Fluid:  1.5-1.8L/day  Koleen Distance MS, RD, LDN Please refer to [facility] for RD and/or RD on-call/weekend/after hours pager

## 2022-10-19 NOTE — Inpatient Diabetes Management (Signed)
Inpatient Diabetes Program Recommendations  AACE/ADA: New Consensus Statement on Inpatient Glycemic Control   Target Ranges:  Prepandial:   less than 140 mg/dL      Peak postprandial:   less than 180 mg/dL (1-2 hours)      Critically ill patients:  140 - 180 mg/dL    Latest Reference Range & Units 10/18/22 03:49 10/18/22 07:41 10/18/22 19:42 10/19/22 08:00  Glucose-Capillary 70 - 99 mg/dL 888 (H) 916 (H) 945 (H) 110 (H)    Latest Reference Range & Units 10/16/22 22:45 10/17/22 04:53 10/17/22 20:27 10/18/22 05:04 10/19/22 03:59  Glucose 70 - 99 mg/dL 038 (H) 882 (H) 800 (H) 150 (H) 117 (H)  Hemoglobin A1C 4.8 - 5.6 %  6.0 (H)       Review of Glycemic Control  Diabetes history: Prediabetes Outpatient Diabetes medications: Ozempic 0.75 mg Qweek (per PCP note on 07/26/22) Current orders for Inpatient glycemic control: Novolog 0-15 units AC&HS  Inpatient Diabetes Program Recommendations:    HbgA1C:  A1C 6.0% on 10/17/22 indicating an average glucose of 125 mg/dl over the past 2-3 months. A1C in Care Everywhere was 6.1% on 07/23/22.  NOTE: Noted consult for diabetes coordinator for prediabetes. Patient admitted with respiratory distress, pneumonia, and acute encephalopathy; patient has trach and initially placed on full vent support. Initial lab glucose 250 mg/dl on 34/91/79 at 15:05 and CBG down to 97 at 1:12 am on 10/17/22 without any insulin. Glucose today 110 mg/dl at 6:97 am. Patient was given Solumedrol 40 mg at 2:28 am on 10/18/22.  In reviewing chart, noted patient seen PCP on 07/26/22 and per office note patient has hx of prediabetes and is prescribed Ozempic for weight loss. Agree with current orders and will follow along while inpatient.  Thanks, Sandra Penner, RN, MSN, CDCES Diabetes Coordinator Inpatient Diabetes Program [phone] (Team Pager from 8am to 5pm)

## 2022-10-19 NOTE — Progress Notes (Signed)
Patient transferred from ICU.  

## 2022-10-19 NOTE — Consult Note (Signed)
PHARMACY CONSULT NOTE  Pharmacy Consult for Electrolyte Monitoring and Replacement   Recent Labs: Potassium (mmol/L)  Date Value  10/19/2022 4.7   Magnesium (mg/dL)  Date Value  92/42/6834 2.3   Calcium (mg/dL)  Date Value  19/62/2297 9.4   Albumin (g/dL)  Date Value  98/92/1194 3.8   Phosphorus (mg/dL)  Date Value  17/40/8144 4.9 (H)   Sodium (mmol/L)  Date Value  10/19/2022 141   Assessment: Patient is a 46 y/o F with medical history including chronic respiratory failure with chronic tracheostomy, morbid obesity, asthma, CKD, HTN, GERD, OSA, obesity hypoventilation syndrome who is admitted with acute on chronic respiratory failure. Patient has been placed on ventilator. Pharmacy consulted to assist with electrolyte monitoring and replacement as indicated.  Goal of Therapy:  Electrolytes within normal limits  Plan:  Improving renal fxn -- Scr 1.99>1.82>1.68; UOP 0.7-0.60ml/k/h; Net I/O -3.5L  K 4.8>4.7: continues on IV Lasix 40 mg daily Phos 5.1>4.9 - slight improvement. Increasing PO intake w/ oral feeds. Mg 2.1>2.3 - ULN All other lytes WNL, no repletion at this time. Follow-up electrolytes with AM labs tomorrow  Martyn Malay 10/19/2022 8:46 AM

## 2022-10-19 NOTE — Progress Notes (Signed)
NAME:  Sandra Holloway, MRN:  [id number], DOB:  1976/07/14, LOS: 2 ADMISSION DATE:  10/16/2022, CHIEF COMPLAINT:  Respiratory Failure   History of Present Illness:   46 year old African-American female with a history of morbid obesity, asthma, CKD stage II, hypertension, GERD, sleep apnea, and obesity hypoventilation syndrome s/p tracheostomy who presented to the ED via EMS with complaints of dyspnea, altered mental status and agitation.  When EMS arrived, patient was struggling to breathe and was hypoxic.  EMS suctioned out thick secretions from her trach and started BMV on route to the ED.  Upon arrival in the ED, patient continued to be agitated, and hypoxic hence she was placed on the vent.  Her ED work-up showed a lactic acid of 5.9, WBC of 21.5, blood glucose of 250 mg/dL, creatinine of 5.17 and chest x-ray showed bilateral perihilar opacities, cardiomegaly and vascular congestion.  Initial blood gas showed a pH of 7.13, PCO2 greater than 70 and a PaO2 of 161.  She was admitted to the ICU for further management of acute on chronic hypercarbic respiratory failure secondary to pneumonia and mucus plugging.  Pertinent  Medical History   -Tracheostomy in 2016, q109month tracheostomy change (Shiley 6.0 distal XLT) -Subglottic stenosis  Significant Hospital Events: Including procedures, antibiotic start and stop dates in addition to other pertinent events   10/17/2022: admit to ICU, ventilated 10/18/2022: trach collar 10/19/2022: Tolerating trach collar @35 % ; respiratory therapist reported pt has been able to cough up secretions and pt reports they have decreased. Pt denies shortness of breath and reports she is back to her baseline.  Transfer to medsurg unit    Interim History / Subjective:  As outlined above  Objective   Blood pressure 126/63, pulse 72, temperature 98.1 F (36.7 C), temperature source Oral, resp. rate (!) 21, height 5\' 2"  (1.575 m), weight (!) 181.3 kg, last menstrual period  09/02/2022, SpO2 100 %.    FiO2 (%):  [35 %-40 %] 35 %   Intake/Output Summary (Last 24 hours) at 10/19/2022 0855 Last data filed at 10/19/2022 0353 Gross per 24 hour  Intake 937.71 ml  Output 3100 ml  Net -2162.29 ml    Filed Weights   10/17/22 0325 10/18/22 0500 10/19/22 0500  Weight: (!) 185.4 kg (!) 185 kg (!) 181.3 kg    Examination: General: Well developed, well nourished female, NAD on trach collar @35 % HENT: Atraumatic, normocephalic, neck supple, no JVD Lungs: Diminished throughout, even, non labored, trach midline size 6 mm dressing dry/intact   Cardiovascular:NSR, rrr, no m/r/g, 2+ radial/2+ distal pulses, no edema  Abdomen: +BS x4, obese, soft, non tender, non distended  Extremities: Normal bulk and tone, moves all extremities  Neuro: Alert and oriented following commands GU: Voiding clear yellow urine  Skin: No rashes or lesions present   Assessment & Plan:   46 year old female with a history of chronic tracheostomy (reported OSA, subglottic stenosis) presents with respiratory failure secondary to mucus plugging.  Acute Hypoxic and Hypercarbic Respiratory Failure secondary to mucous plugging and possible pneumonia  Chronic Tracheostomy (Shiley 6.0 XLT distal) due to subglottic stenosis  Subglottic stenosis - Continue O2 via trach collar for hypoxia and/or dyspnea  - Will change bronchodilator therapy to prn  - Respiratory (~20 pathogens) panel by PCR results pending  - Discontinue steroids  - Aggressive pulmonary hygiene  HTN~stable  - Continue telemetry monitoring   AKI on chronic kidney injury  - Trend BMP  - Replace electrolytes as indicated  - Monitor  UOP - Avoid nephrotoxic medications   Staph epi bacteremia likely contaminant  - Trend WBC and monitor fever curve - Pharmacy spoke with ID on 10/18/22: ok to narrow abx coverage for now will transition to po azithromycin and cefpodoxime  Best Practice (right click and "Reselect all SmartList  Selections" daily)   Diet/type: Regular consistency (see orders) DVT prophylaxis: prophylactic heparin  GI prophylaxis: N/A Lines: N/A Foley:  N/A Code Status:  full code Last date of multidisciplinary goals of care discussion [10/19/2022]  Labs   CBC: Recent Labs  Lab 10/16/22 2245 10/17/22 0453 10/17/22 1818 10/19/22 0359  WBC 21.5* 15.9* 14.2* 12.2*  NEUTROABS 9.3*  --  11.8*  --   HGB 13.6 11.8* 11.1* 10.8*  HCT 44.6 37.3 34.9* 34.6*  MCV 87.8 85.9 85.5 86.5  PLT 316 219 221 233     Basic Metabolic Panel: Recent Labs  Lab 10/16/22 2245 10/17/22 0453 10/17/22 2027 10/18/22 0504 10/19/22 0359  NA 140 139 139 139 141  K 4.0 5.2* 4.5 4.8 4.7  CL 101 104 101 103 102  CO2 26 28 29 28 31   GLUCOSE 250* 131* 145* 150* 117*  BUN 17 19 27* 32* 37*  CREATININE 1.27* 1.53* 1.99* 1.82* 1.68*  CALCIUM 9.3 8.8* 8.8* 9.2 9.4  MG  --  1.9  --  2.1 2.3  PHOS  --   --   --  5.1* 4.9*    GFR: Estimated Creatinine Clearance: 67.8 mL/min (A) (by C-G formula based on SCr of 1.68 mg/dL (H)). Recent Labs  Lab 10/16/22 2245 10/17/22 0126 10/17/22 0453 10/17/22 1818 10/19/22 0359  WBC 21.5*  --  15.9* 14.2* 12.2*  LATICACIDVEN 5.9* 1.3  --   --   --      Liver Function Tests: Recent Labs  Lab 10/16/22 2245 10/17/22 0453  AST 31 22  ALT 18 16  ALKPHOS 82 58  BILITOT 0.7 0.9  PROT 9.1* 7.5  ALBUMIN 4.5 3.8    No results for input(s): "LIPASE", "AMYLASE" in the last 168 hours. No results for input(s): "AMMONIA" in the last 168 hours.  ABG    Component Value Date/Time   PHART 7.33 (L) 10/17/2022 1438   PCO2ART 60 (H) 10/17/2022 1438   PO2ART 87 10/17/2022 1438   HCO3 31.6 (H) 10/17/2022 1438   ACIDBASEDEF 4.5 (H) 10/16/2022 2251   O2SAT 98 10/17/2022 1438     Coagulation Profile: No results for input(s): "INR", "PROTIME" in the last 168 hours.  Cardiac Enzymes: No results for input(s): "CKTOTAL", "CKMB", "CKMBINDEX", "TROPONINI" in the last 168  hours.  HbA1C: Hgb A1c MFr Bld  Date/Time Value Ref Range Status  10/17/2022 04:53 AM 6.0 (H) 4.8 - 5.6 % Final    Comment:    (NOTE)         Prediabetes: 5.7 - 6.4         Diabetes: >6.4         Glycemic control for adults with diabetes: <7.0     CBG: Recent Labs  Lab 10/17/22 2326 10/18/22 0349 10/18/22 0741 10/18/22 1942 10/19/22 0800  GLUCAP 113* 132* 141* 162* 110*     Past Medical History:  She,  has a past medical history of Dyspnea and Hypertension.   Surgical History:   Past Surgical History:  Procedure Laterality Date   TRACHEOSTOMY       Social History:   reports that she does not currently use alcohol. She reports that she does not currently  use drugs.   Family History:  Her family history is not on file.   Allergies No Known Allergies   Home Medications  Prior to Admission medications   Medication Sig Start Date End Date Taking? Authorizing Provider  acetaminophen (TYLENOL) 325 MG tablet Take 2 tablets (650 mg total) by mouth every 6 (six) hours as needed for mild pain (or Fever >/= 101). 03/30/22   Lurene Shadow, MD  cefadroxil (DURICEF) 500 MG capsule Take 1 capsule (500 mg total) by mouth 2 (two) times daily. Patient not taking: Reported on 05/31/2022 03/30/22   Lurene Shadow, MD  DULoxetine (CYMBALTA) 30 MG capsule Take 30 mg by mouth daily. 03/17/22   [provider]  furosemide (LASIX) 40 MG tablet Take 40 mg by mouth daily.    [provider]  loratadine (CLARITIN) 10 MG tablet Take 10 mg by mouth daily.    [provider]  Multiple Vitamin (MULTIVITAMIN WITH MINERALS) TABS tablet Take 1 tablet by mouth daily.    [provider]  omeprazole (PRILOSEC) 20 MG capsule Take 20 mg by mouth daily.    [provider]  oxyCODONE-acetaminophen (PERCOCET) 10-325 MG tablet Take 1 tablet by mouth every 6 (six) hours. Patient not taking: Reported on 05/31/2022 03/17/22   [provider]   oxyCODONE-acetaminophen (PERCOCET) 5-325 MG tablet Take 1 tablet by mouth every 4 (four) hours as needed for severe pain. 09/29/22 09/29/23  Willy Eddy, MD     Critical care time: 35 minutes     Zada Girt, AGNP  Pulmonary/Critical Care Pager [phone] (please enter 7 digits) PCCM Consult Pager [phone] (please enter 7 digits)

## 2022-10-20 ENCOUNTER — Encounter: Payer: Self-pay | Admitting: Certified Registered Nurse Anesthetist

## 2022-10-20 DIAGNOSIS — Z930 Tracheostomy status: Secondary | ICD-10-CM

## 2022-10-20 LAB — MAGNESIUM: Magnesium: 2.1 mg/dL (ref 1.7–2.4)

## 2022-10-20 LAB — BASIC METABOLIC PANEL
Anion gap: 6 (ref 5–15)
BUN: 39 mg/dL — ABNORMAL HIGH (ref 6–20)
CO2: 31 mmol/L (ref 22–32)
Calcium: 9.2 mg/dL (ref 8.9–10.3)
Chloride: 102 mmol/L (ref 98–111)
Creatinine, Ser: 1.45 mg/dL — ABNORMAL HIGH (ref 0.44–1.00)
GFR, Estimated: 45 mL/min — ABNORMAL LOW (ref >60)
Glucose, Bld: 100 mg/dL — ABNORMAL HIGH (ref 70–99)
Potassium: 4.5 mmol/L (ref 3.5–5.1)
Sodium: 139 mmol/L (ref 135–145)

## 2022-10-20 LAB — CULTURE, BLOOD (ROUTINE X 2): Special Requests: ADEQUATE

## 2022-10-20 LAB — GLUCOSE, CAPILLARY
Glucose-Capillary: 111 mg/dL — ABNORMAL HIGH (ref 70–99)
Glucose-Capillary: 128 mg/dL — ABNORMAL HIGH (ref 70–99)

## 2022-10-20 LAB — PHOSPHORUS: Phosphorus: 5 mg/dL — ABNORMAL HIGH (ref 2.5–4.6)

## 2022-10-20 MED ORDER — ACETAMINOPHEN 325 MG PO TABS
650.0000 mg | ORAL_TABLET | Freq: Four times a day (QID) | ORAL | Status: DC | PRN
  Administered 2022-10-20 (×2): 650 mg via ORAL
  Filled 2022-10-20 (×2): qty 2

## 2022-10-20 MED ORDER — OXYCODONE-ACETAMINOPHEN 7.5-325 MG PO TABS
1.0000 | ORAL_TABLET | ORAL | Status: DC | PRN
  Administered 2022-10-20 – 2022-10-21 (×6): 1 via ORAL
  Filled 2022-10-20 (×6): qty 1

## 2022-10-20 NOTE — TOC Progression Note (Signed)
Transition of Care [facility]) - Progression Note    Patient Details  Name: Sandra Holloway MRN: [id number] Date of Birth: [birth year]  Transition of Care [facility]) CM/SW Contact  Truddie Hidden, RN Phone Number: 10/20/2022, 4:17 PM  Clinical Narrative:     Will continue to monitor for needs and changes in disposition.        Expected Discharge Plan and Services                                                 Social Determinants of Health (SDOH) Interventions    Readmission Risk Interventions     No data to display

## 2022-10-20 NOTE — Plan of Care (Signed)

## 2022-10-20 NOTE — Plan of Care (Addendum)
Patient refusing heparin injection and insulin,education on the importance of medication given.Provider made aware.No acute changes noted during the night,continues on 5L/,28%  oxygen trach mask.Safety measures in place,bed in lowest position.Husband at bedside. Problem: Education: Goal: Knowledge of General Education information will improve Description: Including pain rating scale, medication(s)/side effects and non-pharmacologic comfort measures 10/20/2022 0052 by Hermina Barters, RN Outcome: Progressing 10/20/2022 0050 by Hermina Barters, RN Outcome: Progressing   Problem: Health Behavior/Discharge Planning: Goal: Ability to manage health-related needs will improve 10/20/2022 0052 by Hermina Barters, RN Outcome: Progressing 10/20/2022 0050 by Hermina Barters, RN Outcome: Progressing   Problem: Clinical Measurements: Goal: Ability to maintain clinical measurements within normal limits will improve 10/20/2022 0052 by Hermina Barters, RN Outcome: Progressing 10/20/2022 0050 by Hermina Barters, RN Outcome: Progressing Goal: Will remain free from infection 10/20/2022 0052 by Hermina Barters, RN Outcome: Progressing 10/20/2022 0050 by Hermina Barters, RN Outcome: Progressing Goal: Diagnostic test results will improve 10/20/2022 0052 by Hermina Barters, RN Outcome: Progressing 10/20/2022 0050 by Hermina Barters, RN Outcome: Progressing Goal: Respiratory complications will improve 10/20/2022 0052 by Hermina Barters, RN Outcome: Progressing 10/20/2022 0050 by Hermina Barters, RN Outcome: Progressing Goal: Cardiovascular complication will be avoided 10/20/2022 0052 by Hermina Barters, RN Outcome: Progressing 10/20/2022 0050 by Hermina Barters, RN Outcome: Progressing   Problem: Activity: Goal: Risk for activity intolerance will decrease 10/20/2022 0052 by Hermina Barters, RN Outcome: Progressing 10/20/2022 0050 by Hermina Barters, RN Outcome: Progressing    Problem: Nutrition: Goal: Adequate nutrition will be maintained 10/20/2022 0052 by Hermina Barters, RN Outcome: Progressing 10/20/2022 0050 by Hermina Barters, RN Outcome: Progressing   Problem: Coping: Goal: Level of anxiety will decrease 10/20/2022 0052 by Hermina Barters, RN Outcome: Progressing 10/20/2022 0050 by Hermina Barters, RN Outcome: Progressing   Problem: Elimination: Goal: Will not experience complications related to bowel motility 10/20/2022 0052 by Hermina Barters, RN Outcome: Progressing 10/20/2022 0050 by Hermina Barters, RN Outcome: Progressing Goal: Will not experience complications related to urinary retention 10/20/2022 0052 by Hermina Barters, RN Outcome: Progressing 10/20/2022 0050 by Hermina Barters, RN Outcome: Progressing   Problem: Pain Managment: Goal: General experience of comfort will improve 10/20/2022 0052 by Hermina Barters, RN Outcome: Progressing 10/20/2022 0050 by Hermina Barters, RN Outcome: Progressing   Problem: Safety: Goal: Ability to remain free from injury will improve 10/20/2022 0052 by Hermina Barters, RN Outcome: Progressing 10/20/2022 0050 by Hermina Barters, RN Outcome: Progressing   Problem: Skin Integrity: Goal: Risk for impaired skin integrity will decrease 10/20/2022 0052 by Hermina Barters, RN Outcome: Progressing 10/20/2022 0050 by Hermina Barters, RN Outcome: Progressing   Problem: Education: Goal: Ability to describe self-care measures that may prevent or decrease complications (Diabetes Survival Skills Education) will improve 10/20/2022 0052 by Hermina Barters, RN Outcome: Progressing 10/20/2022 0050 by Hermina Barters, RN Outcome: Progressing Goal: Individualized Educational Video(s) 10/20/2022 0052 by Hermina Barters, RN Outcome: Progressing 10/20/2022 0050 by Hermina Barters, RN Outcome: Progressing   Problem: Coping: Goal: Ability to adjust to condition or change in health  will improve 10/20/2022 0052 by Hermina Barters, RN Outcome: Progressing 10/20/2022 0050 by Hermina Barters, RN Outcome: Progressing   Problem: Fluid Volume: Goal: Ability to maintain a balanced intake and output will improve 10/20/2022 0052 by Hermina Barters, RN Outcome: Progressing 10/20/2022 0050 by Hermina Barters, RN Outcome: Progressing   Problem: Health Behavior/Discharge Planning: Goal: Ability to identify and utilize available resources and services will improve 10/20/2022 0052 by Hermina Barters, RN Outcome: Progressing 10/20/2022 0050 by Hermina Barters, RN Outcome: Progressing Goal: Ability to  manage health-related needs will improve 10/20/2022 0052 by Hermina Barters, RN Outcome: Progressing 10/20/2022 0050 by Hermina Barters, RN Outcome: Progressing   Problem: Metabolic: Goal: Ability to maintain appropriate glucose levels will improve 10/20/2022 0052 by Hermina Barters, RN Outcome: Progressing 10/20/2022 0050 by Hermina Barters, RN Outcome: Progressing   Problem: Nutritional: Goal: Maintenance of adequate nutrition will improve 10/20/2022 0052 by Hermina Barters, RN Outcome: Progressing 10/20/2022 0050 by Hermina Barters, RN Outcome: Progressing Goal: Progress toward achieving an optimal weight will improve 10/20/2022 0052 by Hermina Barters, RN Outcome: Progressing 10/20/2022 0050 by Hermina Barters, RN Outcome: Progressing   Problem: Skin Integrity: Goal: Risk for impaired skin integrity will decrease 10/20/2022 0052 by Hermina Barters, RN Outcome: Progressing 10/20/2022 0050 by Hermina Barters, RN Outcome: Progressing   Problem: Tissue Perfusion: Goal: Adequacy of tissue perfusion will improve 10/20/2022 0052 by Hermina Barters, RN Outcome: Progressing 10/20/2022 0050 by Hermina Barters, RN Outcome: Progressing

## 2022-10-20 NOTE — Progress Notes (Addendum)
ID Brief Note ( Chart check remotely)  Remains afebrile  Cr downtrending, No CBC Concerns for mucus plugging by PCCM noted   11/18 blood cx 1 sets staph epi/hominis and another set MS staph lugdunensis ( both drawn from same site and same time and highpossibility of being contaminant)  11/20 blood cx NG in 2 days   TTE 11/20 ( limited study) with no gross evidence of vegetations or endocarditis  Would continue ceftriaxone to cover CAP as well as staph lugnunensis Ok to switch to appropriate PO abtx like augmentin to cover 7 days course when ready to be discharged  Would hold off on TEE unless repeat blood cx are positive.   ID will sign off. Please call if repeat blood cx are positive or any concerns   D/w PCCM    Odette Fraction, MD Infectious Disease Physician [facility] [address] [place], [place] [address] Phone: [phone]  Fax: [phone]

## 2022-10-20 NOTE — Progress Notes (Addendum)
   [facility] has been requested to perform a transesophageal echocardiogram under anesthesia on Sandra Holloway for bacteremia.  After careful review of history and examination, the risks and benefits of transesophageal echocardiogram have been explained including risks of esophageal damage, perforation (1:10,000 risk), bleeding, pharyngeal hematoma as well as other potential complications associated with conscious sedation including aspiration, arrhythmia, respiratory failure and death. Alternatives to treatment were discussed, questions were answered. Patient is willing to proceed.   Nicolasa Ducking, NP  10/20/2022 1:36 PM   ADDENDUM  Notified by pulmonology team that pt no longer needs TEE.  TEE for Friday cancelled.  Nicolasa Ducking, NP 10/20/2022, 5:10 PM

## 2022-10-20 NOTE — Progress Notes (Signed)
NAME:  Sandra Holloway, MRN:  [id number], DOB:  [birth year], LOS: 3 ADMISSION DATE:  10/16/2022, CHIEF COMPLAINT:  Respiratory Failure   History of Present Illness:   46 year old African-American female with a history of morbid obesity, asthma, CKD stage II, hypertension, GERD, sleep apnea, and obesity hypoventilation syndrome s/p tracheostomy who presented to the ED via EMS with complaints of dyspnea, altered mental status and agitation.  When EMS arrived, patient was struggling to breathe and was hypoxic.  EMS suctioned out thick secretions from her trach and started BMV on route to the ED.  Upon arrival in the ED, patient continued to be agitated, and hypoxic hence she was placed on the vent.  Her ED work-up showed a lactic acid of 5.9, WBC of 21.5, blood glucose of 250 mg/dL, creatinine of 1.27 and chest x-ray showed bilateral perihilar opacities, cardiomegaly and vascular congestion.  Initial blood gas showed a pH of 7.13, PCO2 greater than 70 and a PaO2 of 161.  She was admitted to the ICU for further management of acute on chronic hypercarbic respiratory failure secondary to pneumonia and mucus plugging.  Pertinent  Medical History   -Tracheostomy in 2016, q85month tracheostomy change (Shiley 6.0 distal XLT) -Subglottic stenosis  Micro Data: 11/18: SARS-CoV-2 & Influenza PCR>>negative 11/18: Blood culture>>STAPHYLOCOCCUS LUGDUNENSIS, Staph hominis, Staph Epidermidis (resistant to Clindamycin & Erythromycin) 11/19: MRSA PCR>>negative 11/19: Strep Pneumo & Legionella urinary antigen>>negative 11/20: Repeat Blood cultures>> no growth to date  Antimicrobials: Azithromycin 11/19>>11/21 Cefepime 11/18>>11/20 Cefpodoxime 11/21 x1 dose Ceftriaxone 11/20>> Vancomycin 11/18>>11/22  Significant Hospital Events: Including procedures, antibiotic start and stop dates in addition to other pertinent events   10/17/2022: admit to ICU, ventilated 10/18/2022: trach collar 10/19/2022: Tolerating  trach collar @35 % ; respiratory therapist reported pt has been able to cough up secretions and pt reports they have decreased. Pt denies shortness of breath and reports she is back to her baseline.  Transfer to medsurg unit   10/20/2022: Cardiology consulted to evaluate for TEE per ID request in setting of bacteremia  Interim History / Subjective:  -No significant events noted overnight -Afebrile, hemodynamically stable, on Trach collar 28% -Pt is refusing insulin and SQ heparin -ID is requesting TEE in setting of BACTEREMIA ~ Cardiology consulted Creatinine improved to 1.45 from 1.65  Objective   Blood pressure 128/74, pulse 78, temperature 98.4 F (36.9 C), resp. rate 16, height 5\' 2"  (1.575 m), weight (!) 181.3 kg, last menstrual period 09/02/2022, SpO2 94 %.    FiO2 (%):  [28 %] 28 %   Intake/Output Summary (Last 24 hours) at 10/20/2022 1138 Last data filed at 10/20/2022 1045 Gross per 24 hour  Intake 420 ml  Output 275 ml  Net 145 ml    Filed Weights   10/17/22 0325 10/18/22 0500 10/19/22 0500  Weight: (!) 185.4 kg (!) 185 kg (!) 181.3 kg    Examination: General: Well developed, well nourished female, NAD on trach collar @28 % HENT: Atraumatic, normocephalic, neck supple, no JVD Lungs: Diminished throughout, even, non labored, trach midline size 6 mm dressing dry/intact   Cardiovascular:NSR, rrr, no m/r/g, 2+ radial/2+ distal pulses, no edema  Abdomen: +BS x4, obese, soft, non tender, non distended  Extremities: Normal bulk and tone, moves all extremities  Neuro: Alert and oriented following commands GU: Voiding clear yellow urine  Skin: No rashes or lesions present   Assessment & Plan:   46 year old female with a history of chronic tracheostomy (reported OSA, subglottic stenosis) presents with respiratory failure secondary  to mucus plugging.  Acute Hypoxic and Hypercarbic Respiratory Failure secondary to mucous plugging and possible pneumonia  Chronic Tracheostomy  (Shiley 6.0 XLT distal) due to subglottic stenosis  Subglottic stenosis - Continue O2 via trach collar for hypoxia and/or dyspnea  - Will change bronchodilator therapy to prn  - Completed course of steroids  - Aggressive pulmonary hygiene -ABX as above  HTN~stable  - Continue telemetry monitoring   AKI on chronic kidney injury ~ IMPROVING - Trend BMP  - Replace electrolytes as indicated  - Monitor UOP - Avoid nephrotoxic medications   BACTEREMIA: Staph epi, staph hominis, staph lugdenesis ~ ? contaminant  -Monitor fever curve -Trend WBC's  -Follow cultures as above -ID following, appreciate input ~ ABX as per ID, currently on Ceftriaxone -ID requests obtaining TEE ~ will consult Cardiology   Best Practice (right click and "Reselect all SmartList Selections" daily)   Diet/type: Regular consistency (see orders) DVT prophylaxis: prophylactic heparin  GI prophylaxis: N/A Lines: N/A Foley:  N/A Code Status:  full code Last date of multidisciplinary goals of care discussion [N/A]  11/22: Pt updated at bedside 11/22.  All questions answered.  Labs   CBC: Recent Labs  Lab 10/16/22 2245 10/17/22 0453 10/17/22 1818 10/19/22 0359  WBC 21.5* 15.9* 14.2* 12.2*  NEUTROABS 9.3*  --  11.8*  --   HGB 13.6 11.8* 11.1* 10.8*  HCT 44.6 37.3 34.9* 34.6*  MCV 87.8 85.9 85.5 86.5  PLT 316 219 221 233     Basic Metabolic Panel: Recent Labs  Lab 10/17/22 0453 10/17/22 2027 10/18/22 0504 10/19/22 0359 10/20/22 0526  NA 139 139 139 141 139  K 5.2* 4.5 4.8 4.7 4.5  CL 104 101 103 102 102  CO2 28 29 28 31 31   GLUCOSE 131* 145* 150* 117* 100*  BUN 19 27* 32* 37* 39*  CREATININE 1.53* 1.99* 1.82* 1.68* 1.45*  CALCIUM 8.8* 8.8* 9.2 9.4 9.2  MG 1.9  --  2.1 2.3 2.1  PHOS  --   --  5.1* 4.9* 5.0*    GFR: Estimated Creatinine Clearance: 78.5 mL/min (A) (by C-G formula based on SCr of 1.45 mg/dL (H)). Recent Labs  Lab 10/16/22 2245 10/17/22 0126 10/17/22 0453  10/17/22 1818 10/19/22 0359  WBC 21.5*  --  15.9* 14.2* 12.2*  LATICACIDVEN 5.9* 1.3  --   --   --      Liver Function Tests: Recent Labs  Lab 10/16/22 2245 10/17/22 0453  AST 31 22  ALT 18 16  ALKPHOS 82 58  BILITOT 0.7 0.9  PROT 9.1* 7.5  ALBUMIN 4.5 3.8    No results for input(s): "LIPASE", "AMYLASE" in the last 168 hours. No results for input(s): "AMMONIA" in the last 168 hours.  ABG    Component Value Date/Time   PHART 7.33 (L) 10/17/2022 1438   PCO2ART 60 (H) 10/17/2022 1438   PO2ART 87 10/17/2022 1438   HCO3 31.6 (H) 10/17/2022 1438   ACIDBASEDEF 4.5 (H) 10/16/2022 2251   O2SAT 98 10/17/2022 1438     Coagulation Profile: No results for input(s): "INR", "PROTIME" in the last 168 hours.  Cardiac Enzymes: No results for input(s): "CKTOTAL", "CKMB", "CKMBINDEX", "TROPONINI" in the last 168 hours.  HbA1C: Hgb A1c MFr Bld  Date/Time Value Ref Range Status  10/17/2022 04:53 AM 6.0 (H) 4.8 - 5.6 % Final    Comment:    (NOTE)         Prediabetes: 5.7 - 6.4  Diabetes: >6.4         Glycemic control for adults with diabetes: <7.0     CBG: Recent Labs  Lab 10/18/22 0741 10/18/22 1942 10/19/22 0800 10/19/22 2058 10/20/22 0814  GLUCAP 141* 162* 110* 143* 111*     Past Medical History:  She,  has a past medical history of Dyspnea and Hypertension.   Surgical History:   Past Surgical History:  Procedure Laterality Date   TRACHEOSTOMY       Social History:   reports that she does not currently use alcohol. She reports that she does not currently use drugs.   Family History:  Her family history is not on file.   Allergies No Known Allergies   Home Medications  Prior to Admission medications   Medication Sig Start Date End Date Taking? Authorizing Provider  acetaminophen (TYLENOL) 325 MG tablet Take 2 tablets (650 mg total) by mouth every 6 (six) hours as needed for mild pain (or Fever >/= 101). 03/30/22   Lurene Shadow, MD  cefadroxil  (DURICEF) 500 MG capsule Take 1 capsule (500 mg total) by mouth 2 (two) times daily. Patient not taking: Reported on 05/31/2022 03/30/22   Lurene Shadow, MD  DULoxetine (CYMBALTA) 30 MG capsule Take 30 mg by mouth daily. 03/17/22   [provider]  furosemide (LASIX) 40 MG tablet Take 40 mg by mouth daily.    [provider]  loratadine (CLARITIN) 10 MG tablet Take 10 mg by mouth daily.    [provider]  Multiple Vitamin (MULTIVITAMIN WITH MINERALS) TABS tablet Take 1 tablet by mouth daily.    [provider]  omeprazole (PRILOSEC) 20 MG capsule Take 20 mg by mouth daily.    [provider]  oxyCODONE-acetaminophen (PERCOCET) 10-325 MG tablet Take 1 tablet by mouth every 6 (six) hours. Patient not taking: Reported on 05/31/2022 03/17/22   [provider]  oxyCODONE-acetaminophen (PERCOCET) 5-325 MG tablet Take 1 tablet by mouth every 4 (four) hours as needed for severe pain. 09/29/22 09/29/23  Willy Eddy, MD     Care time: 35 minutes     Harlon Ditty, AGACNP-BC [facility] Prefer epic messenger for cross cover needs If after hours, please call E-link

## 2022-10-20 NOTE — Progress Notes (Signed)
Patient refuse heparin injection, patient educated on importance of medication. Provider notified.

## 2022-10-21 LAB — BASIC METABOLIC PANEL
Anion gap: 7 (ref 5–15)
BUN: 35 mg/dL — ABNORMAL HIGH (ref 6–20)
CO2: 31 mmol/L (ref 22–32)
Calcium: 9.4 mg/dL (ref 8.9–10.3)
Chloride: 102 mmol/L (ref 98–111)
Creatinine, Ser: 1.41 mg/dL — ABNORMAL HIGH (ref 0.44–1.00)
GFR, Estimated: 47 mL/min — ABNORMAL LOW (ref >60)
Glucose, Bld: 107 mg/dL — ABNORMAL HIGH (ref 70–99)
Potassium: 4.6 mmol/L (ref 3.5–5.1)
Sodium: 140 mmol/L (ref 135–145)

## 2022-10-21 LAB — GLUCOSE, CAPILLARY: Glucose-Capillary: 104 mg/dL — ABNORMAL HIGH (ref 70–99)

## 2022-10-21 LAB — CBC
HCT: 35.1 % — ABNORMAL LOW (ref 36.0–46.0)
Hemoglobin: 11.2 g/dL — ABNORMAL LOW (ref 12.0–15.0)
MCH: 27.4 pg (ref 26.0–34.0)
MCHC: 31.9 g/dL (ref 30.0–36.0)
MCV: 85.8 fL (ref 80.0–100.0)
Platelets: 211 10*3/uL (ref 150–400)
RBC: 4.09 MIL/uL (ref 3.87–5.11)
RDW: 15.4 % (ref 11.5–15.5)
WBC: 9.7 10*3/uL (ref 4.0–10.5)
nRBC: 0 % (ref 0.0–0.2)

## 2022-10-21 LAB — PHOSPHORUS: Phosphorus: 5.3 mg/dL — ABNORMAL HIGH (ref 2.5–4.6)

## 2022-10-21 LAB — MAGNESIUM: Magnesium: 2.1 mg/dL (ref 1.7–2.4)

## 2022-10-21 MED ORDER — IPRATROPIUM-ALBUTEROL 0.5-2.5 (3) MG/3ML IN SOLN: 3.0000 mL | Freq: Four times a day (QID) | RESPIRATORY_TRACT | 0 refills | Status: DC | PRN

## 2022-10-21 MED ORDER — ALBUTEROL SULFATE HFA 108 (90 BASE) MCG/ACT IN AERS: 2.0000 | INHALATION_SPRAY | Freq: Four times a day (QID) | RESPIRATORY_TRACT | 0 refills | Status: DC | PRN

## 2022-10-21 MED ORDER — AMOXICILLIN-POT CLAVULANATE 875-125 MG PO TABS: 1.0000 | ORAL_TABLET | Freq: Two times a day (BID) | ORAL | 0 refills | Status: AC

## 2022-10-21 MED ORDER — ALPRAZOLAM 0.5 MG PO TABS: 0.5000 mg | ORAL_TABLET | Freq: Two times a day (BID) | ORAL | 0 refills | Status: AC | PRN

## 2022-10-21 NOTE — Discharge Summary (Addendum)
Physician Discharge Summary  Sandra Holloway FUW:[id number] DOB: 11/10/1976 DOA: 10/16/2022  PCP: Dairl Ponder, MD  Admit date: 10/16/2022 Discharge date: 10/21/2022  Admitted From: Home Disposition: Home  Recommendations for Outpatient Follow-up:  Follow up with PCP in 1-2 weeks   Home Health: No Equipment/Devices: Tracheostomy  Discharge Condition: Stable CODE STATUS: Full Diet recommendation: Regular  Brief/Interim Summary: 46 year old African-American female with a history of morbid obesity, asthma, CKD stage II, hypertension, GERD, sleep apnea, and obesity hypoventilation syndrome s/p tracheostomy who presented to the ED via EMS with complaints of dyspnea, altered mental status and agitation.  When EMS arrived, patient was struggling to breathe and was hypoxic.  EMS suctioned out thick secretions from her trach and started BMV on route to the ED.  Upon arrival in the ED, patient continued to be agitated, and hypoxic hence she was placed on the vent.  Her ED work-up showed a lactic acid of 5.9, WBC of 21.5, blood glucose of 250 mg/dL, creatinine of 7.44 and chest x-ray showed bilateral perihilar opacities, cardiomegaly and vascular congestion.  Initial blood gas showed a pH of 7.13, PCO2 greater than 70 and a PaO2 of 161.  She was admitted to the ICU for further management of acute on chronic hypercarbic respiratory failure secondary to pneumonia and mucus plugging.   Mucous plugging did improve.  Patient was maintained in ICU for 3 days.  Transferred out on 11/23.  Evaluated on the day of discharge.  Respiratory status stable.  Lungs relatively clear.  Patient appropriate for discharge at this time.  Regarding blood cultures this is suspected contaminant.  Repeat: Blood cultures negative x 3 days.  TEE initially recommended now but canceled.  No indication.  Patient appropriate for discharge home.   Discharge Diagnoses:  Principal Problem:   Acute respiratory failure (HCC) Active  Problems:   Pneumonia due to infectious organism   Subglottic stenosis   Tracheostomy dependence [facility])  Patient was admitted directly to the ICU on 11/19.  She was ventilated with trach collar.  Tolerated trach collar 35%.  Patient has been able to cough up secretions and they have decreased.  Shortness of breath resolved.  Patient back to baseline.  Transferred to MedSurg on 11/22.  Cardiology was consulted to evaluate for TEE.  ID evaluated and deemed this study not necessary.  Initial blood cultures felt to be contaminant.  Repeat blood cultures no growth to date.  Will transition to p.o. Augmentin at time of discharge.  Complete additional 4 days for total 7-day antibiotic course.  Discharge Instructions  Discharge Instructions     Diet - low sodium heart healthy   Complete by: As directed    Increase activity slowly   Complete by: As directed       Allergies as of 10/21/2022   No Known Allergies      Medication List     STOP taking these medications    cefadroxil 500 MG capsule Commonly known as: DURICEF       TAKE these medications    acetaminophen 325 MG tablet Commonly known as: TYLENOL Take 2 tablets (650 mg total) by mouth every 6 (six) hours as needed for mild pain (or Fever >/= 101).   albuterol 108 (90 Base) MCG/ACT inhaler Commonly known as: VENTOLIN HFA Inhale 2 puffs into the lungs every 6 (six) hours as needed for wheezing or shortness of breath.   ALPRAZolam 0.5 MG tablet Commonly known as: Xanax Take 1 tablet (0.5 mg total) by mouth  2 (two) times daily as needed for up to 7 days for sleep or anxiety.   amoxicillin-clavulanate 875-125 MG tablet Commonly known as: AUGMENTIN Take 1 tablet by mouth 2 (two) times daily for 4 days.   DULoxetine 30 MG capsule Commonly known as: CYMBALTA Take 30 mg by mouth daily.   furosemide 40 MG tablet Commonly known as: LASIX Take 40 mg by mouth daily.   ipratropium-albuterol 0.5-2.5 (3) MG/3ML  Soln Commonly known as: DUONEB Take 3 mLs by nebulization every 6 (six) hours as needed.   loratadine 10 MG tablet Commonly known as: CLARITIN Take 10 mg by mouth daily.   multivitamin with minerals Tabs tablet Take 1 tablet by mouth daily.   omeprazole 20 MG capsule Commonly known as: PRILOSEC Take 20 mg by mouth daily.   oxyCODONE-acetaminophen 7.5-325 MG tablet Commonly known as: PERCOCET Take 1 tablet by mouth every 8 (eight) hours as needed for moderate pain. What changed: Another medication with the same name was removed. Continue taking this medication, and follow the directions you see here.               Durable Medical Equipment  (From admission, onward)           Start     Ordered   10/21/22 1036  For home use only DME Nebulizer machine  Once       Comments: Trach set up compatible  Question Answer Comment  Patient needs a nebulizer to treat with the following condition Asthma   Length of Need Lifetime      10/21/22 1045            No Known Allergies  Consultations: PCCM ID   Procedures/Studies: ECHOCARDIOGRAM COMPLETE  Result Date: 10/18/2022    ECHOCARDIOGRAM REPORT   Patient Name:   Sandra Holloway Date of Exam: 10/18/2022 Medical Rec #:  [id number]      Height:       62.0 in Accession #:    [id number]     Weight:       407.8 lb Date of Birth:  Nov 21, 1976      BSA:          2.588 m Patient Age:    46 years       BP:           Not listed in chart/Not listed in                                              chart mmHg Patient Gender: F              HR:           Not listed in chart bpm. Exam Location:  [facility] Procedure: 2D Echo, Cardiac Doppler and Color Doppler Indications:     CHF-acute diastolic I50.31  History:         Patient has no prior history of Echocardiogram examinations.                  Risk Factors:Hypertension.  Sonographer:     Cristela Blue Referring Phys:  [id number] HARSH CHAWLA Diagnosing Phys: Lorine Bears MD  Sonographer Comments:  Technically difficult study due to poor echo windows, no apical window and no subcostal window. The only view obtainable was parasternal. IMPRESSIONS  1. Left ventricular ejection fraction, by estimation, is 55 to 60%.  The left ventricle has normal function. Left ventricular endocardial border not optimally defined to evaluate regional wall motion. Left ventricular diastolic function could not be evaluated.  2. Right ventricular systolic function is normal. The right ventricular size is normal. Tricuspid regurgitation signal is inadequate for assessing PA pressure.  3. The mitral valve is normal in structure. No evidence of mitral valve regurgitation. No evidence of mitral stenosis.  4. The aortic valve is normal in structure. Aortic valve regurgitation is not visualized. No aortic stenosis is present.  5. Technically difficult study due to poor echo windows, no apical window and no subcostal window. The only view obtainable was parasternal. FINDINGS  Left Ventricle: Left ventricular ejection fraction, by estimation, is 55 to 60%. The left ventricle has normal function. Left ventricular endocardial border not optimally defined to evaluate regional wall motion. The left ventricular internal cavity size was normal in size. There is no left ventricular hypertrophy. Left ventricular diastolic function could not be evaluated. Right Ventricle: The right ventricular size is normal. No increase in right ventricular wall thickness. Right ventricular systolic function is normal. Tricuspid regurgitation signal is inadequate for assessing PA pressure. Left Atrium: Left atrial size was normal in size. Right Atrium: Right atrial size was normal in size. Pericardium: There is no evidence of pericardial effusion. Mitral Valve: The mitral valve is normal in structure. No evidence of mitral valve regurgitation. No evidence of mitral valve stenosis. Tricuspid Valve: The tricuspid valve is normal in structure. Tricuspid valve  regurgitation is not demonstrated. No evidence of tricuspid stenosis. Aortic Valve: The aortic valve is normal in structure. Aortic valve regurgitation is not visualized. No aortic stenosis is present. Pulmonic Valve: The pulmonic valve was normal in structure. Pulmonic valve regurgitation is not visualized. No evidence of pulmonic stenosis. Aorta: The aortic root is normal in size and structure. Venous: The inferior vena cava was not well visualized. IAS/Shunts: No atrial level shunt detected by color flow Doppler.  LEFT VENTRICLE PLAX 2D LVIDd:         4.90 cm LVIDs:         3.10 cm LV PW:         1.60 cm LV IVS:        1.00 cm LVOT diam:     2.00 cm LVOT Area:     3.14 cm  LEFT ATRIUM         Index LA diam:    4.10 cm 1.58 cm/m   AORTA Ao Root diam: 3.20 cm  SHUNTS Systemic Diam: 2.00 cm Lorine Bears MD Electronically signed by Lorine Bears MD Signature Date/Time: 10/18/2022/11:10:22 AM    Final    DG Chest Port 1 View  Result Date: 10/16/2022 CLINICAL DATA:  Questionable sepsis - evaluate for abnormality EXAM: PORTABLE CHEST 1 VIEW COMPARISON:  Radiograph head CT 05/31/2022 FINDINGS: Tracheostomy tube tip at the thoracic inlet. Stable cardiomegaly peer again seen vascular congestion. There is superimposed ill-defined bilateral perihilar opacities. No large pleural effusion. No pneumothorax. Detailed assessment is limited by portable technique and soft tissue attenuation from habitus. IMPRESSION: 1. Ill-defined bilateral perihilar opacities, may represent pulmonary edema or infection. 2. Stable cardiomegaly and vascular congestion. 3. Tracheostomy tube tip at the thoracic inlet. Electronically Signed   By: Narda Rutherford M.D.   On: 10/16/2022 23:22   CT ABDOMEN PELVIS W CONTRAST  Result Date: 09/29/2022 CLINICAL DATA:  Acute abdominal pain EXAM: CT ABDOMEN AND PELVIS WITH CONTRAST TECHNIQUE: Multidetector CT imaging of the abdomen and pelvis was  performed using the standard protocol following  bolus administration of intravenous contrast. RADIATION DOSE REDUCTION: This exam was performed according to the departmental dose-optimization program which includes automated exposure control, adjustment of the mA and/or kV according to patient size and/or use of iterative reconstruction technique. CONTRAST:  OMNIPAQUE IOHEXOL 350 MG/ML SOLN COMPARISON:  05/31/2022 FINDINGS: Lower chest: No acute abnormality. Hepatobiliary: No focal liver abnormality is seen. No gallstones, gallbladder wall thickening, or biliary dilatation. Pancreas: Unremarkable. No pancreatic ductal dilatation or surrounding inflammatory changes. Spleen: Normal in size without focal abnormality. Adrenals/Urinary Tract: Normal adrenal glands. Stable 2 cm exophytic hypodense lesion off the right kidney lower pole, image 31/2 remains indeterminate. Consider follow-up ultrasound for further characterization. No hydronephrosis or acute obstructive uropathy. Nonobstructing punctate nephrolithiasis in the left kidney lower pole. Ureters are symmetric and decompressed. No hydroureter or ureteral calculus. Bladder unremarkable. Stomach/Bowel: Stomach is within normal limits. Appendix appears normal. No evidence of bowel wall thickening, distention, or inflammatory changes. Vascular/Lymphatic: Aortic bifurcation atherosclerosis noted. No acute vascular process. Negative for aneurysm or dissection. No retroperitoneal hemorrhage or hematoma. Mesenteric and renal vasculature remain patent. No veno-occlusive finding. Negative for bulky adenopathy. Reproductive: Uterus and bilateral adnexa are unremarkable. Other: No abdominopelvic ascites. Intact abdominal wall. Negative for ventral hernia. Stable skin thickening and subcutaneous edema of the lower abdominal wall pannus compatible with chronic panniculitis/cellulitis. No underlying subcutaneous abscess. Musculoskeletal: No acute osseous finding. IMPRESSION: 1. No acute intra-abdominal or pelvic finding  by CT. 2. Stable 2 cm exophytic hypodense lesion off the right kidney lower pole remains indeterminate. Consider follow-up ultrasound for further characterization. 3. Punctate nonobstructing left nephrolithiasis. 4. Aortic atherosclerosis. Aortic Atherosclerosis (ICD10-I70.0). Electronically Signed   By: Judie Petit.  Shick M.D.   On: 09/29/2022 15:29      Subjective: Seen and examined on the day of discharge.  Stable no distress.  Back to baseline respiratory status.  Appropriate for discharge home.  Discharge Exam: Vitals:   10/21/22 0416 10/21/22 0757  BP: 133/70 114/63  Pulse: (!) 58 72  Resp: 18 19  Temp: 98.3 F (36.8 C) 97.9 F (36.6 C)  SpO2: 95% 97%   Vitals:   10/20/22 1540 10/20/22 1913 10/21/22 0416 10/21/22 0757  BP: 125/80 (!) 161/96 133/70 114/63  Pulse: 71 68 (!) 58 72  Resp: 19 17 18 19   Temp: 97.8 F (36.6 C) 98.4 F (36.9 C) 98.3 F (36.8 C) 97.9 F (36.6 C)  TempSrc: Oral Oral  Oral  SpO2: 96% 99% 95% 97%  Weight:      Height:        General: Pt is alert, awake, not in acute distress Cardiovascular: RRR, S1/S2 +, no rubs, no gallops Respiratory: CTA bilaterally, no wheezing, no rhonchi Abdominal: Soft, NT, ND, bowel sounds + Extremities: no edema, no cyanosis    The results of significant diagnostics from this hospitalization (including imaging, microbiology, ancillary and laboratory) are listed below for reference.     Microbiology: Recent Results (from the past 240 hour(s))  Blood Culture (routine x 2)     Status: Abnormal   Collection Time: 10/16/22 10:51 PM   Specimen: BLOOD RIGHT ARM  Result Value Ref Range Status   Specimen Description   Final    BLOOD RIGHT ARM Performed at [facility], [address], [place], [place]    Special Requests   Final    Blood Culture adequate volume Performed at [facility], [address] [place], [place]  Culture  Setup Time   Final    Organism ID to follow IN  BOTH AEROBIC AND ANAEROBIC BOTTLES AEROBIC BOTTLE ONLY CRITICAL RESULT CALLED TO, READ BACK BY AND VERIFIED WITH: JUSTIN MILLER 10/17/22 1615 AMK Performed at [facility], [address] [place], [place] [address]    Culture (A)  Final    STAPHYLOCOCCUS HOMINIS STAPHYLOCOCCUS EPIDERMIDIS THE SIGNIFICANCE OF ISOLATING THIS ORGANISM FROM A SINGLE SET OF BLOOD CULTURES WHEN MULTIPLE SETS ARE DRAWN IS UNCERTAIN. PLEASE NOTIFY THE MICROBIOLOGY DEPARTMENT WITHIN ONE WEEK IF SPECIATION AND SENSITIVITIES ARE REQUIRED. Performed at [facility], [address], [place], [place] [address]    Report Status 10/19/2022 FINAL  Final  Blood Culture (routine x 2)     Status: Abnormal   Collection Time: 10/16/22 10:51 PM   Specimen: BLOOD RIGHT ARM  Result Value Ref Range Status   Specimen Description   Final    BLOOD RIGHT ARM Performed at [facility], [address] [place], [place] [address]    Special Requests   Final    Blood Culture adequate volume Performed at [facility], [address] [place], [place] [address]    Culture  Setup Time   Final    GRAM POSITIVE COCCI AEROBIC BOTTLE ONLY CRITICAL RESULT CALLED TO, READ BACK BY AND VERIFIED WITH: CAROLYN CHILDS 10/17/22 1717 AMK Performed at [facility], [address] [place], [place] [address]    Culture STAPHYLOCOCCUS LUGDUNENSIS (A)  Final   Report Status 10/20/2022 FINAL  Final   Organism ID, Bacteria STAPHYLOCOCCUS LUGDUNENSIS  Final      Susceptibility   Staphylococcus lugdunensis - MIC*    CIPROFLOXACIN <=0.5 SENSITIVE Sensitive     ERYTHROMYCIN >=8 RESISTANT Resistant     GENTAMICIN <=0.5 SENSITIVE Sensitive     OXACILLIN <=0.25 SENSITIVE Sensitive     TETRACYCLINE 2 SENSITIVE Sensitive     VANCOMYCIN 2 SENSITIVE Sensitive     TRIMETH/SULFA <=10 SENSITIVE Sensitive     CLINDAMYCIN INTERMEDIATE Intermediate     RIFAMPIN <=0.5 SENSITIVE Sensitive     Inducible Clindamycin  NEGATIVE Sensitive     * STAPHYLOCOCCUS LUGDUNENSIS  Resp Panel by RT-PCR (Flu A&B, Covid) Anterior Nasal Swab     Status: None   Collection Time: 10/16/22 10:51 PM   Specimen: Anterior Nasal Swab  Result Value Ref Range Status   SARS Coronavirus 2 by RT PCR NEGATIVE NEGATIVE Final    Comment: (NOTE) SARS-CoV-2 target nucleic acids are NOT DETECTED.  The SARS-CoV-2 RNA is generally detectable in upper respiratory specimens during the acute phase of infection. The lowest concentration of SARS-CoV-2 viral copies this assay can detect is 138 copies/mL. A negative result does not preclude SARS-Cov-2 infection and should not be used as the sole basis for treatment or other patient management decisions. A negative result may occur with  improper specimen collection/handling, submission of specimen other than nasopharyngeal swab, presence of viral mutation(s) within the areas targeted by this assay, and inadequate number of viral copies(<138 copies/mL). A negative result must be combined with clinical observations, patient history, and epidemiological information. The expected result is Negative.  Fact Sheet for Patients:  BloggerCourse.com  Fact Sheet for Healthcare Providers:  SeriousBroker.it  This test is no t yet approved or cleared by the [place] FDA and  has been authorized for detection and/or diagnosis of SARS-CoV-2 by FDA under an Emergency Use Authorization (EUA). This EUA will remain  in effect (meaning this  test can be used) for the duration of the COVID-19 declaration under Section 564(b)(1) of the Act, 21 U.S.C.section 360bbb-3(b)(1), unless the authorization is terminated  or revoked sooner.       Influenza A by PCR NEGATIVE NEGATIVE Final   Influenza B by PCR NEGATIVE NEGATIVE Final    Comment: (NOTE) The Xpert Xpress SARS-CoV-2/FLU/RSV plus assay is intended as an aid in the diagnosis of influenza from  Nasopharyngeal swab specimens and should not be used as a sole basis for treatment. Nasal washings and aspirates are unacceptable for Xpert Xpress SARS-CoV-2/FLU/RSV testing.  Fact Sheet for Patients: BloggerCourse.com  Fact Sheet for Healthcare Providers: SeriousBroker.it  This test is not yet approved or cleared by the [place] FDA and has been authorized for detection and/or diagnosis of SARS-CoV-2 by FDA under an Emergency Use Authorization (EUA). This EUA will remain in effect (meaning this test can be used) for the duration of the COVID-19 declaration under Section 564(b)(1) of the Act, 21 U.S.C. section 360bbb-3(b)(1), unless the authorization is terminated or revoked.  Performed at [facility], [address] [place], [place] [address]   Blood Culture ID Panel (Reflexed)     Status: Abnormal   Collection Time: 10/16/22 10:51 PM  Result Value Ref Range Status   Enterococcus faecalis NOT DETECTED NOT DETECTED Final   Enterococcus Faecium NOT DETECTED NOT DETECTED Final   Listeria monocytogenes NOT DETECTED NOT DETECTED Final   Staphylococcus species DETECTED (A) NOT DETECTED Final    Comment: CRITICAL RESULT CALLED TO, READ BACK BY AND VERIFIED WITH: JUSTIN MILLER 10/17/22 1615    Staphylococcus aureus (BCID) NOT DETECTED NOT DETECTED Final   Staphylococcus epidermidis DETECTED (A) NOT DETECTED Final    Comment: Methicillin (oxacillin) resistant coagulase negative staphylococcus. Possible blood culture contaminant (unless isolated from more than one blood culture draw or clinical case suggests pathogenicity). No antibiotic treatment is indicated for blood  culture contaminants. CRITICAL RESULT CALLED TO, READ BACK BY AND VERIFIED WITH: JUSTIN MILLER 10/17/22 1615 AMK    Staphylococcus lugdunensis NOT DETECTED NOT DETECTED Final   Streptococcus species NOT DETECTED NOT DETECTED Final   Streptococcus  agalactiae NOT DETECTED NOT DETECTED Final   Streptococcus pneumoniae NOT DETECTED NOT DETECTED Final   Streptococcus pyogenes NOT DETECTED NOT DETECTED Final   A.calcoaceticus-baumannii NOT DETECTED NOT DETECTED Final   Bacteroides fragilis NOT DETECTED NOT DETECTED Final   Enterobacterales NOT DETECTED NOT DETECTED Final   Enterobacter cloacae complex NOT DETECTED NOT DETECTED Final   Escherichia coli NOT DETECTED NOT DETECTED Final   Klebsiella aerogenes NOT DETECTED NOT DETECTED Final   Klebsiella oxytoca NOT DETECTED NOT DETECTED Final   Klebsiella pneumoniae NOT DETECTED NOT DETECTED Final   Proteus species NOT DETECTED NOT DETECTED Final   Salmonella species NOT DETECTED NOT DETECTED Final   Serratia marcescens NOT DETECTED NOT DETECTED Final   Haemophilus influenzae NOT DETECTED NOT DETECTED Final   Neisseria meningitidis NOT DETECTED NOT DETECTED Final   Pseudomonas aeruginosa NOT DETECTED NOT DETECTED Final   Stenotrophomonas maltophilia NOT DETECTED NOT DETECTED Final   Candida albicans NOT DETECTED NOT DETECTED Final   Candida auris NOT DETECTED NOT DETECTED Final   Candida glabrata NOT DETECTED NOT DETECTED Final   Candida krusei NOT DETECTED NOT DETECTED Final   Candida parapsilosis NOT DETECTED NOT DETECTED Final   Candida tropicalis NOT DETECTED NOT DETECTED Final   Cryptococcus neoformans/gattii NOT DETECTED NOT DETECTED Final   Methicillin resistance mecA/C DETECTED (A) NOT DETECTED Final  Comment: CRITICAL RESULT CALLED TO, READ BACK BY AND VERIFIED WITH: JUSTIN MILLER 10/17/22 1615 AMK Performed at [facility], [address] [place], [place] [address]   MRSA Next Gen by PCR, Nasal     Status: None   Collection Time: 10/17/22  1:54 AM   Specimen: Nasal Mucosa; Nasal Swab  Result Value Ref Range Status   MRSA by PCR Next Gen NOT DETECTED NOT DETECTED Final    Comment: (NOTE) The GeneXpert MRSA Assay (FDA approved for NASAL specimens only), is one  component of a comprehensive MRSA colonization surveillance program. It is not intended to diagnose MRSA infection nor to guide or monitor treatment for MRSA infections. Test performance is not FDA approved in patients less than 14 years old. Performed at [facility], [address] [place], [place] [address]   Culture, blood (Routine X 2) w Reflex to ID Panel     Status: None (Preliminary result)   Collection Time: 10/18/22  4:36 PM   Specimen: BLOOD  Result Value Ref Range Status   Specimen Description BLOOD BLOOD LEFT HAND  Final   Special Requests   Final    BOTTLES DRAWN AEROBIC AND ANAEROBIC Blood Culture adequate volume   Culture   Final    NO GROWTH 3 DAYS Performed at [facility], [address] [place], [place] [address]    Report Status PENDING  Incomplete  Culture, blood (Routine X 2) w Reflex to ID Panel     Status: None (Preliminary result)   Collection Time: 10/18/22  4:46 PM   Specimen: BLOOD  Result Value Ref Range Status   Specimen Description BLOOD BLOOD RIGHT HAND  Final   Special Requests   Final    BOTTLES DRAWN AEROBIC AND ANAEROBIC Blood Culture adequate volume   Culture   Final    NO GROWTH 3 DAYS Performed at [facility], [address] [place], [place] [address]    Report Status PENDING  Incomplete     Labs: BNP (last 3 results) Recent Labs    05/31/22 0034 10/17/22 0652  BNP 19.7 55.8   Basic Metabolic Panel: Recent Labs  Lab 10/17/22 0453 10/17/22 2027 10/18/22 0504 10/19/22 0359 10/20/22 0526 10/21/22 0431  NA 139 139 139 141 139 140  K 5.2* 4.5 4.8 4.7 4.5 4.6  CL 104 101 103 102 102 102  CO2 GLUCOSE 131* 145* 150* 117* 100* 107*  BUN 19 27* 32* 37* 39* 35*  CREATININE 1.53* 1.99* 1.82* 1.68* 1.45* 1.41*  CALCIUM 8.8* 8.8* 9.2 9.4 9.2 9.4  MG 1.9  --  2.1 2.3 2.1 2.1  PHOS  --   --  5.1* 4.9* 5.0* 5.3*   Liver Function Tests: Recent Labs  Lab 10/16/22 2245  10/17/22 0453  AST 31 22  ALT 18 16  ALKPHOS 82 58  BILITOT 0.7 0.9  PROT 9.1* 7.5  ALBUMIN 4.5 3.8   No results for input(s): "LIPASE", "AMYLASE" in the last 168 hours. No results for input(s): "AMMONIA" in the last 168 hours. CBC: Recent Labs  Lab 10/16/22 2245 10/17/22 0453 10/17/22 1818 10/19/22 0359 10/21/22 0431  WBC 21.5* 15.9* 14.2* 12.2* 9.7  NEUTROABS 9.3*  --  11.8*  --   --   HGB 13.6 11.8* 11.1* 10.8* 11.2*  HCT 44.6 37.3 34.9* 34.6* 35.1*  MCV 87.8 85.9 85.5 86.5 85.8  PLT 316 219 221 233 211   Cardiac Enzymes: No  results for input(s): "CKTOTAL", "CKMB", "CKMBINDEX", "TROPONINI" in the last 168 hours. BNP: Invalid input(s): "POCBNP" CBG: Recent Labs  Lab 10/19/22 0800 10/19/22 2058 10/20/22 0814 10/20/22 2103 10/21/22 0757  GLUCAP 110* 143* 111* 128* 104*   D-Dimer No results for input(s): "DDIMER" in the last 72 hours. Hgb A1c No results for input(s): "HGBA1C" in the last 72 hours. Lipid Profile No results for input(s): "CHOL", "HDL", "LDLCALC", "TRIG", "CHOLHDL", "LDLDIRECT" in the last 72 hours. Thyroid function studies No results for input(s): "TSH", "T4TOTAL", "T3FREE", "THYROIDAB" in the last 72 hours.  Invalid input(s): "FREET3" Anemia work up No results for input(s): "VITAMINB12", "FOLATE", "FERRITIN", "TIBC", "IRON", "RETICCTPCT" in the last 72 hours. Urinalysis    Component Value Date/Time   COLORURINE STRAW (A) 10/17/2022 1059   APPEARANCEUR CLEAR (A) 10/17/2022 1059   LABSPEC 1.011 10/17/2022 1059   PHURINE 5.0 10/17/2022 1059   GLUCOSEU NEGATIVE 10/17/2022 1059   HGBUR NEGATIVE 10/17/2022 1059   BILIRUBINUR NEGATIVE 10/17/2022 1059   KETONESUR NEGATIVE 10/17/2022 1059   PROTEINUR 100 (A) 10/17/2022 1059   NITRITE NEGATIVE 10/17/2022 1059   LEUKOCYTESUR NEGATIVE 10/17/2022 1059   Sepsis Labs Recent Labs  Lab 10/17/22 0453 10/17/22 1818 10/19/22 0359 10/21/22 0431  WBC 15.9* 14.2* 12.2* 9.7   Microbiology Recent  Results (from the past 240 hour(s))  Blood Culture (routine x 2)     Status: Abnormal   Collection Time: 10/16/22 10:51 PM   Specimen: BLOOD RIGHT ARM  Result Value Ref Range Status   Specimen Description   Final    BLOOD RIGHT ARM Performed at [facility], [address] [place], [place] [address]    Special Requests   Final    Blood Culture adequate volume Performed at [facility], [address] [place], [place] [address]    Culture  Setup Time   Final    Organism ID to follow IN BOTH AEROBIC AND ANAEROBIC BOTTLES AEROBIC BOTTLE ONLY CRITICAL RESULT CALLED TO, READ BACK BY AND VERIFIED WITH: JUSTIN MILLER 10/17/22 1615 AMK Performed at [facility], [address] [place], [place] [address]    Culture (A)  Final    STAPHYLOCOCCUS HOMINIS STAPHYLOCOCCUS EPIDERMIDIS THE SIGNIFICANCE OF ISOLATING THIS ORGANISM FROM A SINGLE SET OF BLOOD CULTURES WHEN MULTIPLE SETS ARE DRAWN IS UNCERTAIN. PLEASE NOTIFY THE MICROBIOLOGY DEPARTMENT WITHIN ONE WEEK IF SPECIATION AND SENSITIVITIES ARE REQUIRED. Performed at [facility], [address] [place], [place] [address]    Report Status 10/19/2022 FINAL  Final  Blood Culture (routine x 2)     Status: Abnormal   Collection Time: 10/16/22 10:51 PM   Specimen: BLOOD RIGHT ARM  Result Value Ref Range Status   Specimen Description   Final    BLOOD RIGHT ARM Performed at [facility], [address] [place], [place] [address]    Special Requests   Final    Blood Culture adequate volume Performed at [facility], [address] [place], [place] [address]    Culture  Setup Time   Final    GRAM POSITIVE COCCI AEROBIC BOTTLE ONLY CRITICAL RESULT CALLED TO, READ BACK BY AND VERIFIED WITH: CAROLYN CHILDS 10/17/22 1717 AMK Performed at [facility], [address] [place], [place] [address]    Culture STAPHYLOCOCCUS LUGDUNENSIS (A)  Final   Report Status 10/20/2022 FINAL   Final   Organism ID, Bacteria STAPHYLOCOCCUS LUGDUNENSIS  Final      Susceptibility   Staphylococcus lugdunensis -  MIC*    CIPROFLOXACIN <=0.5 SENSITIVE Sensitive     ERYTHROMYCIN >=8 RESISTANT Resistant     GENTAMICIN <=0.5 SENSITIVE Sensitive     OXACILLIN <=0.25 SENSITIVE Sensitive     TETRACYCLINE 2 SENSITIVE Sensitive     VANCOMYCIN 2 SENSITIVE Sensitive     TRIMETH/SULFA <=10 SENSITIVE Sensitive     CLINDAMYCIN INTERMEDIATE Intermediate     RIFAMPIN <=0.5 SENSITIVE Sensitive     Inducible Clindamycin NEGATIVE Sensitive     * STAPHYLOCOCCUS LUGDUNENSIS  Resp Panel by RT-PCR (Flu A&B, Covid) Anterior Nasal Swab     Status: None   Collection Time: 10/16/22 10:51 PM   Specimen: Anterior Nasal Swab  Result Value Ref Range Status   SARS Coronavirus 2 by RT PCR NEGATIVE NEGATIVE Final    Comment: (NOTE) SARS-CoV-2 target nucleic acids are NOT DETECTED.  The SARS-CoV-2 RNA is generally detectable in upper respiratory specimens during the acute phase of infection. The lowest concentration of SARS-CoV-2 viral copies this assay can detect is 138 copies/mL. A negative result does not preclude SARS-Cov-2 infection and should not be used as the sole basis for treatment or other patient management decisions. A negative result may occur with  improper specimen collection/handling, submission of specimen other than nasopharyngeal swab, presence of viral mutation(s) within the areas targeted by this assay, and inadequate number of viral copies(<138 copies/mL). A negative result must be combined with clinical observations, patient history, and epidemiological information. The expected result is Negative.  Fact Sheet for Patients:  BloggerCourse.com  Fact Sheet for Healthcare Providers:  SeriousBroker.it  This test is no t yet approved or cleared by the [place] FDA and  has been authorized for detection and/or diagnosis of  SARS-CoV-2 by FDA under an Emergency Use Authorization (EUA). This EUA will remain  in effect (meaning this test can be used) for the duration of the COVID-19 declaration under Section 564(b)(1) of the Act, 21 U.S.C.section 360bbb-3(b)(1), unless the authorization is terminated  or revoked sooner.       Influenza A by PCR NEGATIVE NEGATIVE Final   Influenza B by PCR NEGATIVE NEGATIVE Final    Comment: (NOTE) The Xpert Xpress SARS-CoV-2/FLU/RSV plus assay is intended as an aid in the diagnosis of influenza from Nasopharyngeal swab specimens and should not be used as a sole basis for treatment. Nasal washings and aspirates are unacceptable for Xpert Xpress SARS-CoV-2/FLU/RSV testing.  Fact Sheet for Patients: BloggerCourse.com  Fact Sheet for Healthcare Providers: SeriousBroker.it  This test is not yet approved or cleared by the [place] FDA and has been authorized for detection and/or diagnosis of SARS-CoV-2 by FDA under an Emergency Use Authorization (EUA). This EUA will remain in effect (meaning this test can be used) for the duration of the COVID-19 declaration under Section 564(b)(1) of the Act, 21 U.S.C. section 360bbb-3(b)(1), unless the authorization is terminated or revoked.  Performed at [facility], [address] [place], [place] [address]   Blood Culture ID Panel (Reflexed)     Status: Abnormal   Collection Time: 10/16/22 10:51 PM  Result Value Ref Range Status   Enterococcus faecalis NOT DETECTED NOT DETECTED Final   Enterococcus Faecium NOT DETECTED NOT DETECTED Final   Listeria monocytogenes NOT DETECTED NOT DETECTED Final   Staphylococcus species DETECTED (A) NOT DETECTED Final    Comment: CRITICAL RESULT CALLED TO, READ BACK BY AND VERIFIED WITH: JUSTIN MILLER 10/17/22 1615    Staphylococcus aureus (BCID) NOT DETECTED NOT DETECTED Final   Staphylococcus epidermidis  DETECTED (A) NOT DETECTED  Final    Comment: Methicillin (oxacillin) resistant coagulase negative staphylococcus. Possible blood culture contaminant (unless isolated from more than one blood culture draw or clinical case suggests pathogenicity). No antibiotic treatment is indicated for blood  culture contaminants. CRITICAL RESULT CALLED TO, READ BACK BY AND VERIFIED WITH: JUSTIN MILLER 10/17/22 1615 AMK    Staphylococcus lugdunensis NOT DETECTED NOT DETECTED Final   Streptococcus species NOT DETECTED NOT DETECTED Final   Streptococcus agalactiae NOT DETECTED NOT DETECTED Final   Streptococcus pneumoniae NOT DETECTED NOT DETECTED Final   Streptococcus pyogenes NOT DETECTED NOT DETECTED Final   A.calcoaceticus-baumannii NOT DETECTED NOT DETECTED Final   Bacteroides fragilis NOT DETECTED NOT DETECTED Final   Enterobacterales NOT DETECTED NOT DETECTED Final   Enterobacter cloacae complex NOT DETECTED NOT DETECTED Final   Escherichia coli NOT DETECTED NOT DETECTED Final   Klebsiella aerogenes NOT DETECTED NOT DETECTED Final   Klebsiella oxytoca NOT DETECTED NOT DETECTED Final   Klebsiella pneumoniae NOT DETECTED NOT DETECTED Final   Proteus species NOT DETECTED NOT DETECTED Final   Salmonella species NOT DETECTED NOT DETECTED Final   Serratia marcescens NOT DETECTED NOT DETECTED Final   Haemophilus influenzae NOT DETECTED NOT DETECTED Final   Neisseria meningitidis NOT DETECTED NOT DETECTED Final   Pseudomonas aeruginosa NOT DETECTED NOT DETECTED Final   Stenotrophomonas maltophilia NOT DETECTED NOT DETECTED Final   Candida albicans NOT DETECTED NOT DETECTED Final   Candida auris NOT DETECTED NOT DETECTED Final   Candida glabrata NOT DETECTED NOT DETECTED Final   Candida krusei NOT DETECTED NOT DETECTED Final   Candida parapsilosis NOT DETECTED NOT DETECTED Final   Candida tropicalis NOT DETECTED NOT DETECTED Final   Cryptococcus neoformans/gattii NOT DETECTED NOT DETECTED Final   Methicillin resistance mecA/C  DETECTED (A) NOT DETECTED Final    Comment: CRITICAL RESULT CALLED TO, READ BACK BY AND VERIFIED WITH: JUSTIN MILLER 10/17/22 1615 AMK Performed at [facility], [address] [place], [place] [address]   MRSA Next Gen by PCR, Nasal     Status: None   Collection Time: 10/17/22  1:54 AM   Specimen: Nasal Mucosa; Nasal Swab  Result Value Ref Range Status   MRSA by PCR Next Gen NOT DETECTED NOT DETECTED Final    Comment: (NOTE) The GeneXpert MRSA Assay (FDA approved for NASAL specimens only), is one component of a comprehensive MRSA colonization surveillance program. It is not intended to diagnose MRSA infection nor to guide or monitor treatment for MRSA infections. Test performance is not FDA approved in patients less than 67 years old. Performed at [facility], [address] [place], [place] [address]   Culture, blood (Routine X 2) w Reflex to ID Panel     Status: None (Preliminary result)   Collection Time: 10/18/22  4:36 PM   Specimen: BLOOD  Result Value Ref Range Status   Specimen Description BLOOD BLOOD LEFT HAND  Final   Special Requests   Final    BOTTLES DRAWN AEROBIC AND ANAEROBIC Blood Culture adequate volume   Culture   Final    NO GROWTH 3 DAYS Performed at [facility], [address] [place], [place] [address]    Report Status PENDING  Incomplete  Culture, blood (Routine X 2) w Reflex to ID Panel     Status: None (Preliminary result)   Collection Time: 10/18/22  4:46 PM   Specimen: BLOOD  Result Value Ref Range Status   Specimen Description BLOOD  BLOOD RIGHT HAND  Final   Special Requests   Final    BOTTLES DRAWN AEROBIC AND ANAEROBIC Blood Culture adequate volume   Culture   Final    NO GROWTH 3 DAYS Performed at [facility], [address], [place], [place] [address]    Report Status PENDING  Incomplete     Time coordinating discharge: Over 30 minutes  SIGNED:   Tresa Moore, MD  [facility] 10/21/2022, 1:01 PM Pager   If 7PM-7AM, please contact night-coverage

## 2022-10-21 NOTE — Plan of Care (Signed)

## 2022-10-21 NOTE — TOC Initial Note (Signed)
Transition of Care [facility]) - Initial/Assessment Note    Patient Details  Name: Sandra Holloway MRN: [id number] Date of Birth: 1976-11-23  [facility]) CM/SW Contact:    Allayne Butcher, RN Phone Number: 10/21/2022, 10:47 AM  Clinical Narrative:                 Patient is medically cleared for discharge home today.  Patient lives at home in [place].  Patient is independent and manages her own trach care.  Patient gets her trach supplies through [facility] and her oxygen is with [employer].  Patient would like to get an extra tank delivered to her home, she also reports needing a nebulizer, humidification, and a suction machine.  Reached out to [employer].  We will be able to get the nebulizer and portable tank from [employer] but the humidification and suction will need to come from [facility].   Patient's daughter will be picking her up today to transport her home.   Expected Discharge Plan: Home/Self Care Barriers to Discharge: Barriers Resolved   Patient Goals and CMS Choice Patient states their goals for this hospitalization and ongoing recovery are:: patient would like to go home      Expected Discharge Plan and Services Expected Discharge Plan: Home/Self Care   Discharge Planning Services: CM Consult Post Acute Care Choice: Durable Medical Equipment Living arrangements for the past 2 months: Single Family Home                 DME Arranged: Nebulizer machine DME Agency: [employer] Date DME Agency Contacted: 10/21/22 Time DME Agency Contacted: 1046 Representative spoke with at DME Agency: Elie Goody HH Arranged: NA HH Agency: NA        Prior Living Arrangements/Services Living arrangements for the past 2 months: Single Family Home   Patient language and need for interpreter reviewed:: Yes Do you feel safe going back to the place where you live?: Yes      Need for Family Participation in Patient Care: Yes (Comment) Care giver support system in place?: Yes (comment) Current home  services: DME (trach supplies, oxygen) Criminal Activity/Legal Involvement Pertinent to Current Situation/Hospitalization: No - Comment as needed  Activities of Daily Living      Permission Sought/Granted Permission sought to share information with : Case Manager, Other (comment) Permission granted to share information with : Yes, Verbal Permission Granted     Permission granted to share info w AGENCY: [employer], [facility]        Emotional Assessment Appearance:: Appears stated age Attitude/Demeanor/Rapport: Engaged Affect (typically observed): Accepting Orientation: : Oriented to Self, Oriented to Place, Oriented to  Time, Oriented to Situation Alcohol / Substance Use: Not Applicable Psych Involvement: No (comment)  Admission diagnosis:  Acute respiratory failure (HCC) [J96.00] Acute respiratory failure with hypoxia and hypercapnia (HCC) [J96.01, J96.02] Pneumonia due to infectious organism, unspecified laterality, unspecified part of lung [J18.9] Patient Active Problem List   Diagnosis Date Noted   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   Acute respiratory failure (HCC) 10/17/2022   Pneumonia due to infectious organism 10/17/2022   Acute asthma exacerbation 05/31/2022   GERD without esophagitis 05/31/2022   Sepsis due to cellulitis (HCC) 03/28/2022   Morbid obesity with BMI of 60.0-69.9, adult (HCC) 03/28/2022   Acute on chronic respiratory failure with hypoxia (HCC) 03/28/2022   H/O tracheostomy 03/28/2022   PCP:  Dairl Ponder, MD Pharmacy:   [facility] - [place], [place], [place] - [address] [place] [address] Phone: [phone] Fax: [phone]  [facility] [phone] [facility], [place] - [address] [place] [address] Phone: [phone] Fax: [phone]     Social Determinants of Health (SDOH) Interventions    Readmission Risk Interventions     No data to display

## 2022-10-21 NOTE — Plan of Care (Signed)
IV removed, discharge instructions reviewed, patient discharged to home with boyfriend.

## 2022-10-21 NOTE — Progress Notes (Signed)
Pt continues to refuse her heparin injection. The risks and benefits explained to pt and she verbalized understanding. The hospitalist on call was notified

## 2022-10-21 NOTE — TOC Progression Note (Signed)
Transition of Care [facility]) - Progression Note    Patient Details  Name: Sandra Holloway MRN: [id number] Date of Birth: [birth year]  Transition of [facility]) CM/SW Contact  Allayne Butcher, RN Phone Number: 10/21/2022, 10:57 AM  Clinical Narrative:    Received a call back from [facility] with [employer], they do provide her trach supplies and they refill them for her every 3 months so they will take care of her equipment needs.  Patient has been having a problem with her concentrator at home, it needs a new filter. [employer] will be out today to service her concentrator they just ask that patient call them when she gets home to let them know she is there.    Expected Discharge Plan: Home/Self Care Barriers to Discharge: Barriers Resolved  Expected Discharge Plan and Services Expected Discharge Plan: Home/Self Care   Discharge Planning Services: CM Consult Post Acute Care Choice: Durable Medical Equipment Living arrangements for the past 2 months: Single Family Home                 DME Arranged: Nebulizer machine DME Agency: [employer] Date DME Agency Contacted: 10/21/22 Time DME Agency Contacted: 1046 Representative spoke with at DME Agency: Elie Goody HH Arranged: NA HH Agency: NA         Social Determinants of Health (SDOH) Interventions    Readmission Risk Interventions     No data to display

## 2022-10-22 SURGERY — ECHOCARDIOGRAM, TRANSESOPHAGEAL
Anesthesia: General

## 2022-10-23 LAB — CULTURE, BLOOD (ROUTINE X 2)
Culture: NO GROWTH
Culture: NO GROWTH
Special Requests: ADEQUATE
Special Requests: ADEQUATE

## 2022-11-03 ENCOUNTER — Other Ambulatory Visit: Payer: Self-pay

## 2022-11-03 ENCOUNTER — Inpatient Hospital Stay
Admission: EM | Admit: 2022-11-03 | Discharge: 2022-11-09 | DRG: 291 | Disposition: A | Discharge status: Home / Self Care | Payer: Medicaid Other | Attending: Obstetrics and Gynecology | Admitting: Obstetrics and Gynecology

## 2022-11-03 ENCOUNTER — Emergency Department: Payer: Medicaid Other

## 2022-11-03 ENCOUNTER — Encounter: Payer: Self-pay | Admitting: Internal Medicine

## 2022-11-03 DIAGNOSIS — I130 Hypertensive heart and chronic kidney disease with heart failure and stage 1 through stage 4 chronic kidney disease, or unspecified chronic kidney disease: Principal | ICD-10-CM | POA: Diagnosis present

## 2022-11-03 DIAGNOSIS — E662 Morbid (severe) obesity with alveolar hypoventilation: Secondary | ICD-10-CM | POA: Diagnosis present

## 2022-11-03 DIAGNOSIS — Z8701 Personal history of pneumonia (recurrent): Secondary | ICD-10-CM

## 2022-11-03 DIAGNOSIS — Z1152 Encounter for screening for COVID-19: Secondary | ICD-10-CM | POA: Diagnosis not present

## 2022-11-03 DIAGNOSIS — Z9889 Other specified postprocedural states: Secondary | ICD-10-CM

## 2022-11-03 DIAGNOSIS — J9620 Acute and chronic respiratory failure, unspecified whether with hypoxia or hypercapnia: Secondary | ICD-10-CM | POA: Diagnosis not present

## 2022-11-03 DIAGNOSIS — J45909 Unspecified asthma, uncomplicated: Secondary | ICD-10-CM | POA: Diagnosis present

## 2022-11-03 DIAGNOSIS — J386 Stenosis of larynx: Secondary | ICD-10-CM | POA: Diagnosis present

## 2022-11-03 DIAGNOSIS — N1831 Chronic kidney disease, stage 3a: Secondary | ICD-10-CM | POA: Diagnosis present

## 2022-11-03 DIAGNOSIS — T501X6A Underdosing of loop [high-ceiling] diuretics, initial encounter: Secondary | ICD-10-CM | POA: Diagnosis present

## 2022-11-03 DIAGNOSIS — Z79899 Other long term (current) drug therapy: Secondary | ICD-10-CM

## 2022-11-03 DIAGNOSIS — D631 Anemia in chronic kidney disease: Secondary | ICD-10-CM | POA: Diagnosis present

## 2022-11-03 DIAGNOSIS — Z6841 Body Mass Index (BMI) 40.0 and over, adult: Secondary | ICD-10-CM

## 2022-11-03 DIAGNOSIS — Z91128 Patient's intentional underdosing of medication regimen for other reason: Secondary | ICD-10-CM

## 2022-11-03 DIAGNOSIS — Z930 Tracheostomy status: Secondary | ICD-10-CM | POA: Diagnosis not present

## 2022-11-03 DIAGNOSIS — J9601 Acute respiratory failure with hypoxia: Secondary | ICD-10-CM | POA: Diagnosis present

## 2022-11-03 DIAGNOSIS — L304 Erythema intertrigo: Secondary | ICD-10-CM | POA: Diagnosis present

## 2022-11-03 DIAGNOSIS — J9622 Acute and chronic respiratory failure with hypercapnia: Secondary | ICD-10-CM | POA: Diagnosis present

## 2022-11-03 DIAGNOSIS — J9621 Acute and chronic respiratory failure with hypoxia: Secondary | ICD-10-CM | POA: Diagnosis present

## 2022-11-03 DIAGNOSIS — G4733 Obstructive sleep apnea (adult) (pediatric): Secondary | ICD-10-CM | POA: Diagnosis present

## 2022-11-03 DIAGNOSIS — I5033 Acute on chronic diastolic (congestive) heart failure: Secondary | ICD-10-CM | POA: Diagnosis present

## 2022-11-03 LAB — COMPREHENSIVE METABOLIC PANEL
ALT: 16 U/L (ref 0–44)
AST: 18 U/L (ref 15–41)
Albumin: 3.5 g/dL (ref 3.5–5.0)
Alkaline Phosphatase: 51 U/L (ref 38–126)
Anion gap: 8 (ref 5–15)
BUN: 19 mg/dL (ref 6–20)
CO2: 27 mmol/L (ref 22–32)
Calcium: 8.7 mg/dL — ABNORMAL LOW (ref 8.9–10.3)
Chloride: 103 mmol/L (ref 98–111)
Creatinine, Ser: 1.19 mg/dL — ABNORMAL HIGH (ref 0.44–1.00)
GFR, Estimated: 57 mL/min — ABNORMAL LOW (ref >60)
Glucose, Bld: 97 mg/dL (ref 70–99)
Potassium: 4.7 mmol/L (ref 3.5–5.1)
Sodium: 138 mmol/L (ref 135–145)
Total Bilirubin: 0.7 mg/dL (ref 0.3–1.2)
Total Protein: 7 g/dL (ref 6.5–8.1)

## 2022-11-03 LAB — BLOOD GAS, VENOUS
Acid-Base Excess: 5.8 mmol/L — ABNORMAL HIGH (ref 0.0–2.0)
Bicarbonate: 35.2 mmol/L — ABNORMAL HIGH (ref 20.0–28.0)
O2 Saturation: 61.4 %
Patient temperature: 37
pCO2, Ven: 75 mmHg (ref 44–60)
pH, Ven: 7.28 (ref 7.25–7.43)
pO2, Ven: 38 mmHg (ref 32–45)

## 2022-11-03 LAB — LACTIC ACID, PLASMA
Lactic Acid, Venous: 1 mmol/L (ref 0.5–1.9)
Lactic Acid, Venous: 0.6 mmol/L (ref 0.5–1.9)

## 2022-11-03 LAB — CBC WITH DIFFERENTIAL/PLATELET
Abs Immature Granulocytes: 0.03 10*3/uL (ref 0.00–0.07)
Basophils Absolute: 0 10*3/uL (ref 0.0–0.1)
Basophils Relative: 0 %
Eosinophils Absolute: 0.1 10*3/uL (ref 0.0–0.5)
Eosinophils Relative: 1 %
HCT: 34 % — ABNORMAL LOW (ref 36.0–46.0)
Hemoglobin: 10.6 g/dL — ABNORMAL LOW (ref 12.0–15.0)
Immature Granulocytes: 0 %
Lymphocytes Relative: 18 %
Lymphs Abs: 1.7 10*3/uL (ref 0.7–4.0)
MCH: 27.9 pg (ref 26.0–34.0)
MCHC: 31.2 g/dL (ref 30.0–36.0)
MCV: 89.5 fL (ref 80.0–100.0)
Monocytes Absolute: 0.4 10*3/uL (ref 0.1–1.0)
Monocytes Relative: 5 %
Neutro Abs: 6.9 10*3/uL (ref 1.7–7.7)
Neutrophils Relative %: 76 %
Platelets: 189 10*3/uL (ref 150–400)
RBC: 3.8 MIL/uL — ABNORMAL LOW (ref 3.87–5.11)
RDW: 14.7 % (ref 11.5–15.5)
WBC: 9.1 10*3/uL (ref 4.0–10.5)
nRBC: 0 % (ref 0.0–0.2)

## 2022-11-03 LAB — PROCALCITONIN: Procalcitonin: <0.1 ng/mL

## 2022-11-03 LAB — TROPONIN I (HIGH SENSITIVITY)
Troponin I (High Sensitivity): 12 ng/L (ref <18)
Troponin I (High Sensitivity): 14 ng/L (ref <18)

## 2022-11-03 LAB — RESP PANEL BY RT-PCR (FLU A&B, COVID) ARPGX2
Influenza A by PCR: NEGATIVE
Influenza B by PCR: NEGATIVE
SARS Coronavirus 2 by RT PCR: NEGATIVE

## 2022-11-03 LAB — HCG, QUANTITATIVE, PREGNANCY: hCG, Beta Chain, Quant, S: <1 m[IU]/mL (ref <5)

## 2022-11-03 LAB — BRAIN NATRIURETIC PEPTIDE: B Natriuretic Peptide: 40.9 pg/mL (ref 0.0–100.0)

## 2022-11-03 MED ORDER — VANCOMYCIN HCL 2000 MG/400ML IV SOLN
2000.0000 mg | Freq: Once | INTRAVENOUS | Status: AC
  Administered 2022-11-03: 2000 mg via INTRAVENOUS
  Filled 2022-11-03: qty 400

## 2022-11-03 MED ORDER — SODIUM CHLORIDE 0.9 % IV SOLN
2.0000 g | Freq: Once | INTRAVENOUS | Status: AC
  Administered 2022-11-03: 2 g via INTRAVENOUS
  Filled 2022-11-03: qty 12.5

## 2022-11-03 MED ORDER — ACETAMINOPHEN 325 MG PO TABS
650.0000 mg | ORAL_TABLET | ORAL | Status: DC | PRN
  Administered 2022-11-03: 650 mg via ORAL
  Filled 2022-11-03: qty 2

## 2022-11-03 MED ORDER — SODIUM CHLORIDE 0.9% FLUSH
3.0000 mL | Freq: Two times a day (BID) | INTRAVENOUS | Status: DC
  Administered 2022-11-03 – 2022-11-09 (×12): 3 mL via INTRAVENOUS

## 2022-11-03 MED ORDER — POLYETHYLENE GLYCOL 3350 17 G PO PACK: 17.0000 g | PACK | Freq: Every day | ORAL | Status: DC | PRN

## 2022-11-03 MED ORDER — ONDANSETRON HCL 4 MG/2ML IJ SOLN: 4.0000 mg | Freq: Four times a day (QID) | INTRAMUSCULAR | Status: DC | PRN

## 2022-11-03 MED ORDER — FUROSEMIDE 10 MG/ML IJ SOLN
40.0000 mg | Freq: Once | INTRAMUSCULAR | Status: AC
  Administered 2022-11-03: 40 mg via INTRAVENOUS
  Filled 2022-11-03: qty 4

## 2022-11-03 MED ORDER — DOCUSATE SODIUM 100 MG PO CAPS: 100.0000 mg | ORAL_CAPSULE | Freq: Two times a day (BID) | ORAL | Status: DC | PRN

## 2022-11-03 MED ORDER — LORAZEPAM 1 MG PO TABS
1.0000 mg | ORAL_TABLET | Freq: Once | ORAL | Status: AC
  Administered 2022-11-03: 1 mg via ORAL
  Filled 2022-11-03: qty 1

## 2022-11-03 MED ORDER — PANTOPRAZOLE SODIUM 40 MG IV SOLR
40.0000 mg | INTRAVENOUS | Status: DC
  Administered 2022-11-04 – 2022-11-06 (×3): 40 mg via INTRAVENOUS
  Filled 2022-11-03 (×3): qty 10

## 2022-11-03 MED ORDER — SODIUM CHLORIDE 0.9 % IV SOLN: 250.0000 mL | INTRAVENOUS | Status: DC | PRN

## 2022-11-03 MED ORDER — SODIUM CHLORIDE 0.9% FLUSH: 3.0000 mL | INTRAVENOUS | Status: DC | PRN

## 2022-11-03 MED ORDER — OXYCODONE-ACETAMINOPHEN 7.5-325 MG PO TABS
1.0000 | ORAL_TABLET | ORAL | Status: DC | PRN
  Administered 2022-11-03 – 2022-11-09 (×30): 1 via ORAL
  Filled 2022-11-03 (×30): qty 1

## 2022-11-03 MED ORDER — ENOXAPARIN SODIUM 100 MG/ML IJ SOSY
0.5000 mg/kg | PREFILLED_SYRINGE | INTRAMUSCULAR | Status: DC
  Administered 2022-11-03 – 2022-11-08 (×4): 90 mg via SUBCUTANEOUS
  Filled 2022-11-03 (×8): qty 0.9

## 2022-11-03 MED ORDER — IPRATROPIUM-ALBUTEROL 0.5-2.5 (3) MG/3ML IN SOLN
3.0000 mL | Freq: Once | RESPIRATORY_TRACT | Status: AC
  Administered 2022-11-03: 3 mL via RESPIRATORY_TRACT
  Filled 2022-11-03: qty 3

## 2022-11-03 MED ORDER — FUROSEMIDE 20 MG PO TABS
40.0000 mg | ORAL_TABLET | Freq: Every day | ORAL | Status: DC
  Administered 2022-11-04 – 2022-11-09 (×6): 40 mg via ORAL
  Filled 2022-11-03 (×6): qty 2

## 2022-11-03 MED ORDER — CYCLOBENZAPRINE HCL 5 MG PO TABS
5.0000 mg | ORAL_TABLET | Freq: Every day | ORAL | Status: DC
  Administered 2022-11-03 – 2022-11-08 (×6): 5 mg via ORAL
  Filled 2022-11-03 (×7): qty 1

## 2022-11-03 NOTE — ED Notes (Addendum)
This RN unable to obtain bloodwork at this time. IV team consult placement at this time.

## 2022-11-03 NOTE — ED Notes (Signed)
RT at bedside to place pt on vent at this time.

## 2022-11-03 NOTE — Consult Note (Signed)
PHARMACY -  BRIEF ANTIBIOTIC NOTE   Pharmacy has received consult(s) for vancomycin from an ED provider.  The patient's profile has been reviewed for ht/wt/allergies/indication/available labs.    One time order(s) placed for: Vancomycin 2g IV x1 Pt also receiving Cefepime 2g IV x1 in ED per provider order.  Further antibiotics/pharmacy consults should be ordered by admitting physician if indicated.                       Thank you, Martyn Malay 11/03/2022  11:34 AM

## 2022-11-03 NOTE — ED Notes (Signed)
Pt repositioned in bed for comfort.

## 2022-11-03 NOTE — ED Provider Notes (Signed)
[facility] Provider Note    Event Date/Time   First MD Initiated Contact with Patient 11/03/22 1103     (approximate)   History   Respiratory Distress   HPI  Sandra Holloway is a 46 y.o. female   Past medical history of Tracheostomy for obesity hypoventilation syndrome / sleep apnea, markedly elevated BMI, asthma, CKD, hypertension w recent admission for pneumonia on vent to ICU, discharge end of November with increased secretions and cough and dyspnea for couple of days.  He denies chest pain.  She denies fever. Has not been taking medications for the last several days due to forgetting.  Usually on Lasix.  EMS reports suctioning some thick secretions which improved her respiratory status.  She is normally on 6 L but was transitioned to 10 L with improvement in her oxygenation.   History was obtained via the patient and also by independent historian EMS who gives information as obtained above.  I also reviewed external medical notes including a discharge summary dated 10/21/2022 when she was admitted for respiratory distress and was found to be hypoxemic with thick secretions and eventually placed on a ventilator for hypercarbic respiratory failure secondary to mucous plugging and pneumonia.      Physical Exam   Triage Vital Signs: ED Triage Vitals [11/03/22 1104]  Enc Vitals Group     BP      Pulse      Resp      Temp      Temp src      SpO2      Weight (!) 399 lb 11.1 oz (181.3 kg)     Height 5\' 2"  (1.575 m)     Head Circumference      Peak Flow      Pain Score 4     Pain Loc      Pain Edu?      Excl. in GC?     Most recent vital signs: Vitals:   11/03/22 1430 11/03/22 1515  BP: (!) 163/68   Pulse: 72 73  Resp: 19 15  Temp:    SpO2: 96% 96%    General: Awake, with respiratory distress, anxious appearing.  She has junky sounding cough. CV:  Due to body habitus it is difficult to ascertain whether she is fluid overloaded though she  does not have any obvious pitting edema to her lower extremities. Resp:  Respiratory distress and tachypnea but her oxygen saturation is 96% on 8 L blow by her lung sounds are distant obvious rales or focalities or wheezing Abd:  No distention.  Nontender.    ED Results / Procedures / Treatments   Labs (all labs ordered are listed, but only abnormal results are displayed) Labs Reviewed  COMPREHENSIVE METABOLIC PANEL - Abnormal; Notable for the following components:      Result Value   Creatinine, Ser 1.19 (*)    Calcium 8.7 (*)    GFR, Estimated 57 (*)    All other components within normal limits  CBC WITH DIFFERENTIAL/PLATELET - Abnormal; Notable for the following components:   RBC 3.80 (*)    Hemoglobin 10.6 (*)    HCT 34.0 (*)    All other components within normal limits  BLOOD GAS, VENOUS - Abnormal; Notable for the following components:   pCO2, Ven 75 (*)    Bicarbonate 35.2 (*)    Acid-Base Excess 5.8 (*)    All other components within normal limits  RESP PANEL BY RT-PCR (FLU  A&B, COVID) ARPGX2  CULTURE, BLOOD (ROUTINE X 2)  CULTURE, BLOOD (ROUTINE X 2)  EXPECTORATED SPUTUM ASSESSMENT W GRAM STAIN, RFLX TO RESP C  LACTIC ACID, PLASMA  BRAIN NATRIURETIC PEPTIDE  HCG, QUANTITATIVE, PREGNANCY  PROCALCITONIN  LACTIC ACID, PLASMA  CBC  CREATININE, SERUM  URINALYSIS, COMPLETE (UACMP) WITH MICROSCOPIC  LEGIONELLA PNEUMOPHILA SEROGP 1 UR AG  MYCOPLASMA PNEUMONIAE ANTIBODY, IGM  POC URINE PREG, ED  TROPONIN I (HIGH SENSITIVITY)  TROPONIN I (HIGH SENSITIVITY)     I reviewed labs and they are notable for acidemia and hypercapnia on her venous blood gas  EKG  ED ECG REPORT I, Pilar Jarvis, the attending physician, personally viewed and interpreted this ECG.   Date: 11/03/2022  EKG Time: 1132  Rate: 78  Rhythm: normal sinus rhythm  Axis: nl  Intervals:none  ST&T Change: No acute ischemic changes    RADIOLOGY I independently reviewed and interpreted diffuse  opacities on chest x-ray concerning for pulmonary edema   PROCEDURES:  Critical Care performed: Yes, see critical care procedure note(s)  .Critical Care  Performed by: Pilar Jarvis, MD Authorized by: Pilar Jarvis, MD   Critical care provider statement:    Critical care time (minutes):  30   Critical care was necessary to treat or prevent imminent or life-threatening deterioration of the following conditions:  Respiratory failure   Critical care was time spent personally by me on the following activities:  Development of treatment plan with patient or surrogate, discussions with consultants, evaluation of patient's response to treatment, examination of patient, ordering and review of laboratory studies, ordering and review of radiographic studies, ordering and performing treatments and interventions, pulse oximetry, re-evaluation of patient's condition and review of old charts    MEDICATIONS ORDERED IN ED: Medications  vancomycin (VANCOREADY) IVPB 2000 mg/400 mL (2,000 mg Intravenous New Bag/Given 11/03/22 1414)  sodium chloride flush (NS) 0.9 % injection 3 mL ( Intravenous Canceled Entry 11/03/22 1419)  sodium chloride flush (NS) 0.9 % injection 3 mL (has no administration in time range)  0.9 %  sodium chloride infusion (has no administration in time range)  acetaminophen (TYLENOL) tablet 650 mg (has no administration in time range)  docusate sodium (COLACE) capsule 100 mg (has no administration in time range)  polyethylene glycol (MIRALAX / GLYCOLAX) packet 17 g (has no administration in time range)  ondansetron (ZOFRAN) injection 4 mg (has no administration in time range)  enoxaparin (LOVENOX) injection 90 mg (has no administration in time range)  pantoprazole (PROTONIX) injection 40 mg (has no administration in time range)  LORazepam (ATIVAN) tablet 1 mg (1 mg Oral Given 11/03/22 1115)  ipratropium-albuterol (DUONEB) 0.5-2.5 (3) MG/3ML nebulizer solution 3 mL (3 mLs Nebulization Given  11/03/22 1126)  ceFEPIme (MAXIPIME) 2 g in sodium chloride 0.9 % 100 mL IVPB (0 g Intravenous Stopped 11/03/22 1347)  furosemide (LASIX) injection 40 mg (40 mg Intravenous Given 11/03/22 1415)    Consultants:  I spoke with ICU consultant Dr. Belia Heman regarding care plan for this patient.   IMPRESSION / MDM / ASSESSMENT AND PLAN / ED COURSE  I reviewed the triage vital signs and the nursing notes.                              Differential diagnosis includes, but is not limited to, hypoxemic or hypercarbic respiratory failure, pneumonia, mucous plugging, pulmonary edema, PE, viral respiratory infection, sepsis, ACS   The patient is on the cardiac monitor  to evaluate for evidence of arrhythmia and/or significant heart rate changes.  MDM: Patient with tracheostomy tube with increased sputum production and cough respiratory distress with the above differential.  She was placed on 10 L with improvement of oxygen saturation and got Ativan which helped her work of breathing and now she is much improved on 8 L while ongoing work-up.  She was given 1 DuoNeb with a history of asthma as well empirically.  She was given healthcare associated pneumonia coverage given the increase in sputum production and respiratory distress concern for pneumonia.  We will obtain further diagnostics prior to considering diuresis.  She will need admission.  Pulmonary edema and hypercapnic respiratory failure given Lasix and put on the ventilator and admitted to the ICU.  I anticipate that she will need admission.Patient's presentation is most consistent with acute presentation with potential threat to life or bodily function.       FINAL CLINICAL IMPRESSION(S) / ED DIAGNOSES   Final diagnoses:  Acute respiratory failure with hypoxia and hypercapnia (HCC)     Rx / DC Orders   ED Discharge Orders     None        Note:  This document was prepared using Dragon voice recognition software and may include  unintentional dictation errors.    Pilar Jarvis, MD 11/03/22 [phone]

## 2022-11-03 NOTE — ED Notes (Signed)
RN at bedside. Pt vent off and inner canula off. Inner canula bent due to pt trying to get secretions out. RT called for assistance. Pt linens also soiled due to increased UO.

## 2022-11-03 NOTE — ED Triage Notes (Signed)
Pt to ED via [facility] from home for respiratory distress. Pt has trach in place. Pt sats on 6L [place] low 80s. Pt placed on 10L  by EMS and suctioned twice in route. Pt reports has not been med compliant and trach has not been suctioned x1 week. Pt labored and gurgling on arrival. Pt also reports CP.   EMS: BP 112/84 Temp 99 10L trach collar

## 2022-11-03 NOTE — Progress Notes (Signed)
Received pt from Ed to room CCU # 7, pt moved self from cart to bed. Pt is on trach collar oxygen, #6 SLT. Pt is alert/oriented x 4, pt oriented to room and call light. Pt wanted to sit at edge of the bed now. Call light at reach. Bed to lowest position, Pt denies at need now.

## 2022-11-03 NOTE — ED Notes (Signed)
X-ray at bedside

## 2022-11-03 NOTE — ED Notes (Signed)
Respiratory at the bedside

## 2022-11-03 NOTE — Progress Notes (Signed)
P. Inner cannula changed . Pt. Wants to stay off vent at this time and would like to go back on vent tonight at bedtime.Pt. placed on venti-collar at 31%,sat 95,rr 20. No apparent distress noted at this time.

## 2022-11-03 NOTE — ED Notes (Signed)
Report given to ICU RN for ICU 7.

## 2022-11-03 NOTE — ED Notes (Signed)
Attempted to call report. ICU secretary informed me that charge RN advised will call back later.

## 2022-11-03 NOTE — ED Notes (Signed)
Pt transitioned to hospital bed. Purewick in place. Linens changed.

## 2022-11-03 NOTE — H&P (Signed)
NAME:  Sandra Holloway, MRN:  [id number], DOB:  Aug 08, 1976, LOS: 0 ADMISSION DATE:  11/03/2022, CONSULTATION DATE:  11/03/2022 REFERRING MD:  Dr. Jacelyn Grip, CHIEF COMPLAINT:  Acute Respiratory Distress   Brief Pt Description / Synopsis:  46 y.o. 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  Upon EMS arrival she was noted to be hypoxic with O2 saturations in the low 80s on 6 L nasal cannula, along with have thick secretions.  EMS was able to suction the secretions with some improvement in her respiratory status, and She did require uptitration to 10 L of supplemental oxygen  She denies chest pain, abdominal pain, nausea, vomiting, diarrhea, dysuria, fever, chills.  She does report she has not been compliant with her home Lasix as she does not like how it makes her have to urinate frequently.  Of note she was recently admitted at [facility] from 10/16/2022 through 10/21/2022 for acute hypoxic respiratory failure due to pneumonia and mucous plugging.  She was discharged home on a course of Augmentin, which she reports she completed and was compliant with.  ED Course: Initial Vital Signs: Temperature 99 Fahrenheit orally, respiratory rate 26, pulse 80, blood pressure 182/90, SpO2 95% on 10 L Significant Labs: BUN 19, creatinine 1.19, BNP 40, high-sensitivity troponin 12, lactic acid 1.0, procalcitonin less than 0.10,  WBC 9.1, hemoglobin 10.6, hematocrit 34 Negative for COVID-19 and influenza VBG: pH 7.28/pCO2 75/pO2 38/bicarb 35.2 Imaging Chest X-ray>>IMPRESSION: 1. Diffuse airspace opacities are unchanged when compared with the prior exam, likely due to pulmonary edema. 2. Possible new trace bilateral pleural effusions. Medications Administered: 40 mg IV Lasix, IV cefepime and vancomycin  Given her acute respiratory distress and work of breathing, she was placed on mechanical ventilation via her chronic tracheostomy.  PCCM was asked to admit for further workup and treatment.  Please see significant hospital events section below for full detailed hospital course.  Pertinent  Medical History   Past Medical History:  Diagnosis Date   Dyspnea    Hypertension     Micro Data:  12/6: SARS-CoV-2 and influenza PCR>> negative 12/6: Blood culture x 2>> 12/6: Legionella urinary antigen>> 12/6: Mycoplasma pneumonia>>  Antimicrobials:  Cefepime 12/6 x 1 dose Vancomycin 12/6 x 1 dose  Significant Hospital Events: Including procedures, antibiotic start and stop dates in addition to other pertinent events   12/6: Presented to ED in respiratory distress.  Required being placed on mechanical ventilation.  PCCM asked to admit  Interim History / Subjective:  -Patient seen and evaluated in the ED -ED provider placed patient on mechanical ventilation via chronic tracheostomy due to respiratory distress -Patient is awake and alert tolerating the vent, no acute distress, actually wanting to get off the ventilator -Patient reports she has not been compliant with her home Lasix as it makes her urinate too frequently -She does report she completed the course of Augmentin from her recent discharge   Objective   Blood pressure (!) 168/83, pulse 76, temperature 99 F (37.2 C), temperature  source Oral, resp. rate (!) 22, height 5' 2" (1.575 m), weight (!) 181.3 kg, last menstrual period 09/02/2022, SpO2 93 %.     Vent Mode: AC FiO2 (%):  [28 %] 28 % Set Rate:  [16 bmp] 16 bmp Vt Set:  [450 mL] 450 mL PEEP:  [5 cmH20] 5 cmH20  No intake or output data in the 24 hours ending 11/03/22 1449 Filed Weights   11/03/22 1104  Weight: (!) 181.3 kg    Examination: General: Acute on chronically morbidly obese female, on vent via chronic tracheostomy, no acute distress HENT: Atraumatic, normocephalic, neck supple, difficult to assess JVD due to body habitus Lungs: Distant breath sounds bilaterally, synchronous with the vent, even, nonlabored Cardiovascular: Regular rate and rhythm, S1-S2, no murmurs, rubs, gallops Abdomen: Obese, soft, nontender, nondistended, no guarding rebound tenderness, bowel sounds positive x4  Extremities: Normal bulk and tone, 3+ edema bilateral lower extremities, no cyanosis Neuro: Awake and alert, oriented x 4, moves all extremities to commands, no focal deficits GU: External female catheter in place  Resolved Hospital Problem list     Assessment & Plan:   #Acute on Chronic Hypoxic & Hypercapnic Respiratory Failure in setting of Pulmonary Edema due to noncompliance with Lasix vs ? 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, OHS/OSA -Full vent support, implement lung protective strategies -Plateau pressures less than 30 cm H20 -Wean FiO2 & PEEP as tolerated to maintain O2 sats >92% -Follow intermittent Chest X-ray & ABG as needed -Spontaneous Breathing Trials when respiratory parameters met and mental status permits -Implement VAP Bundle -Prn Bronchodilators -Diuresis as blood pressure and renal function permits ~received 40 mg IV Lasix in ED x 1 dose -Antibiotics as above  Acute Decompensated HFpEF due to noncompliance with Lasix Hypertension Echocardiogram 10/18/2022: LVEF 55 to 60%, unable to evaluate diastolic function RV function normal -Continuous cardiac monitoring -Maintain MAP >65 -Trend HS Troponin until peaked  ? HCAP, low  suspicion -Monitor fever curve -Trend WBC's & Procalcitonin -Follow cultures as above -Procalcitonin is negative and without leukocytosis, will hold off on further empiric antibiotics at this time with low threshold to begin  CKD -Monitor I&O's / urinary output -Follow BMP -Ensure adequate renal perfusion -Avoid nephrotoxic agents as able -Replace electrolytes as indicated  Normocytic normochromic anemia without overt bleeding noted -Monitor for S/Sx of bleeding -Trend CBC -Lovenox for VTE Prophylaxis  -Transfuse for Hgb <7       Best Practice (right click and "Reselect all SmartList Selections" daily)   Diet/type: NPO DVT prophylaxis: LMWH GI prophylaxis: PPI Lines: N/A Foley:  N/A Code Status:  full code Last date of multidisciplinary goals of care discussion [N/A]  12/6: Pt updated at bedside.  Labs   CBC: Recent Labs  Lab 11/03/22 1111  WBC 9.1  NEUTROABS 6.9  HGB 10.6*  HCT 34.0*  MCV 89.5  PLT 387    Basic Metabolic Panel: Recent Labs  Lab 11/03/22 1111  NA 138  K 4.7  CL 103  CO2 27  GLUCOSE 97  BUN 19  CREATININE 1.19*  CALCIUM 8.7*   GFR: Estimated Creatinine Clearance: 95.7 mL/min (A) (by C-G formula based on SCr of 1.19 mg/dL (H)). Recent Labs  Lab 11/03/22 1111  WBC 9.1  LATICACIDVEN 1.0    Liver Function Tests: Recent Labs  Lab 11/03/22 1111  AST 18  ALT 16  ALKPHOS 51  BILITOT 0.7  PROT 7.0  ALBUMIN 3.5   No results for input(s): "LIPASE", "AMYLASE" in the  last 168 hours. No results for input(s): "AMMONIA" in the last 168 hours.  ABG    Component Value Date/Time   PHART 7.33 (L) 10/17/2022 1438   PCO2ART 60 (H) 10/17/2022 1438   PO2ART 87 10/17/2022 1438   HCO3 35.2 (H) 11/03/2022 1104   ACIDBASEDEF 4.5 (H) 10/16/2022 2251   O2SAT 61.4 11/03/2022 1104     Coagulation Profile: No results for input(s): "INR", "PROTIME" in the last 168 hours.  Cardiac Enzymes: No results for input(s): "CKTOTAL", "CKMB",  "CKMBINDEX", "TROPONINI" in the last 168 hours.  HbA1C: Hgb A1c MFr Bld  Date/Time Value Ref Range Status  10/17/2022 04:53 AM 6.0 (H) 4.8 - 5.6 % Final    Comment:    (NOTE)         Prediabetes: 5.7 - 6.4         Diabetes: >6.4         Glycemic control for adults with diabetes: <7.0     CBG: No results for input(s): "GLUCAP" in the last 168 hours.  Review of Systems:   Positives in BOLD: Gen: Denies fever, chills, weight change, fatigue, night sweats HEENT: Denies blurred vision, double vision, hearing loss, tinnitus, sinus congestion, rhinorrhea, sore throat, neck stiffness, dysphagia PULM: Denies shortness of breath, cough, sputum production, hemoptysis, wheezing CV: Denies chest pain, edema, orthopnea, paroxysmal nocturnal dyspnea, palpitations GI: Denies abdominal pain, nausea, vomiting, diarrhea, hematochezia, melena, constipation, change in bowel habits GU: Denies dysuria, hematuria, polyuria, oliguria, urethral discharge Endocrine: Denies hot or cold intolerance, polyuria, polyphagia or appetite change Derm: Denies rash, dry skin, scaling or peeling skin change Heme: Denies easy bruising, bleeding, bleeding gums Neuro: Denies headache, numbness, weakness, slurred speech, loss of memory or consciousness   Past Medical History:  She,  has a past medical history of Dyspnea and Hypertension.   Surgical History:   Past Surgical History:  Procedure Laterality Date   TRACHEOSTOMY       Social History:   reports that she does not currently use alcohol. She reports that she does not currently use drugs.   Family History:  Her family history is not on file.   Allergies No Known Allergies   Home Medications  Prior to Admission medications   Medication Sig Start Date End Date Taking? Authorizing Provider  cyclobenzaprine (FLEXERIL) 5 MG tablet Take 5 mg by mouth at bedtime.   Yes [provider]  furosemide (LASIX) 40 MG tablet Take 40 mg by mouth daily.    Yes [provider]  loratadine (CLARITIN) 10 MG tablet Take 10 mg by mouth daily.   Yes [provider]  omeprazole (PRILOSEC) 20 MG capsule Take 20 mg by mouth daily.   Yes [provider]  oxyCODONE-acetaminophen (PERCOCET) 7.5-325 MG tablet Take 1 tablet by mouth every 4 (four) hours as needed for moderate pain or severe pain. 10/01/22  Yes [provider]  acetaminophen (TYLENOL) 325 MG tablet Take 2 tablets (650 mg total) by mouth every 6 (six) hours as needed for mild pain (or Fever >/= 101). 03/30/22   Jennye Boroughs, MD  albuterol (VENTOLIN HFA) 108 (90 Base) MCG/ACT inhaler Inhale 2 puffs into the lungs every 6 (six) hours as needed for wheezing or shortness of breath. Patient not taking: Reported on 11/03/2022 10/21/22   Ralene Muskrat B, MD  DULoxetine (CYMBALTA) 30 MG capsule Take 30 mg by mouth daily. Patient not taking: Reported on 11/03/2022 03/17/22   [provider]  ipratropium-albuterol (DUONEB) 0.5-2.5 (3) MG/3ML SOLN  Take 3 mLs by nebulization every 6 (six) hours as needed. 10/21/22   Sidney Ace, MD  Multiple Vitamin (MULTIVITAMIN WITH MINERALS) TABS tablet Take 1 tablet by mouth daily. Patient not taking: Reported on 11/03/2022    [provider]     Critical care time: 50 minutes     Darel Hong, AGACNP-BC [facility] [place] epic messenger for cross cover needs If after hours, please call E-link

## 2022-11-04 DIAGNOSIS — J9620 Acute and chronic respiratory failure, unspecified whether with hypoxia or hypercapnia: Secondary | ICD-10-CM | POA: Diagnosis not present

## 2022-11-04 LAB — BASIC METABOLIC PANEL
Anion gap: 8 (ref 5–15)
BUN: 20 mg/dL (ref 6–20)
CO2: 31 mmol/L (ref 22–32)
Calcium: 8.9 mg/dL (ref 8.9–10.3)
Chloride: 99 mmol/L (ref 98–111)
Creatinine, Ser: 1.23 mg/dL — ABNORMAL HIGH (ref 0.44–1.00)
GFR, Estimated: 55 mL/min — ABNORMAL LOW (ref >60)
Glucose, Bld: 104 mg/dL — ABNORMAL HIGH (ref 70–99)
Potassium: 3.7 mmol/L (ref 3.5–5.1)
Sodium: 138 mmol/L (ref 135–145)

## 2022-11-04 LAB — BLOOD GAS, ARTERIAL
Acid-Base Excess: 7.8 mmol/L — ABNORMAL HIGH (ref 0.0–2.0)
Bicarbonate: 33.3 mmol/L — ABNORMAL HIGH (ref 20.0–28.0)
FIO2: 40 %
O2 Saturation: 98.3 %
Patient temperature: 37
pCO2 arterial: 49 mmHg — ABNORMAL HIGH (ref 32–48)
pH, Arterial: 7.44 (ref 7.35–7.45)
pO2, Arterial: 105 mmHg (ref 83–108)

## 2022-11-04 LAB — PHOSPHORUS: Phosphorus: 4.9 mg/dL — ABNORMAL HIGH (ref 2.5–4.6)

## 2022-11-04 LAB — MAGNESIUM: Magnesium: 1.8 mg/dL (ref 1.7–2.4)

## 2022-11-04 LAB — CBC
HCT: 31.8 % — ABNORMAL LOW (ref 36.0–46.0)
Hemoglobin: 10 g/dL — ABNORMAL LOW (ref 12.0–15.0)
MCH: 27.3 pg (ref 26.0–34.0)
MCHC: 31.4 g/dL (ref 30.0–36.0)
MCV: 86.9 fL (ref 80.0–100.0)
Platelets: 197 10*3/uL (ref 150–400)
RBC: 3.66 MIL/uL — ABNORMAL LOW (ref 3.87–5.11)
RDW: 14.6 % (ref 11.5–15.5)
WBC: 6.5 10*3/uL (ref 4.0–10.5)
nRBC: 0 % (ref 0.0–0.2)

## 2022-11-04 LAB — MYCOPLASMA PNEUMONIAE ANTIBODY, IGM: Mycoplasma pneumo IgM: <770 U/mL (ref 0–769)

## 2022-11-04 MED ORDER — CHLORHEXIDINE GLUCONATE CLOTH 2 % EX PADS
6.0000 | MEDICATED_PAD | Freq: Every day | CUTANEOUS | Status: DC
  Administered 2022-11-04 – 2022-11-09 (×4): 6 via TOPICAL

## 2022-11-04 MED ORDER — MAGNESIUM SULFATE 2 GM/50ML IV SOLN
2.0000 g | Freq: Once | INTRAVENOUS | Status: AC
  Administered 2022-11-04: 2 g via INTRAVENOUS
  Filled 2022-11-04: qty 50

## 2022-11-04 NOTE — TOC Progression Note (Signed)
Transition of Care [facility]) - Progression Note    Patient Details  Name: Sandra Holloway MRN: [id number] Date of Birth: 03/21/1976  [facility]) CM/SW Contact  Allayne Butcher, RN Phone Number: 11/04/2022, 1:49 PM  Clinical Narrative:    NIV order has been signed by MD, narrative placed in chart by MD.  Waiting to hear back from [employer] about insurance authorization.  MD would like to discharge patient today.     Expected Discharge Plan: Home/Self Care    Expected Discharge Plan and Services Expected Discharge Plan: Home/Self Care                         DME Arranged: NIV DME Agency: [employer] Date DME Agency Contacted: 11/03/22 Time DME Agency Contacted: 1600 Representative spoke with at DME Agency: Elie Goody             Social Determinants of Health (SDOH) Interventions    Readmission Risk Interventions     No data to display

## 2022-11-04 NOTE — Progress Notes (Signed)
Patient wore ventilator approximately 2hrs

## 2022-11-04 NOTE — Progress Notes (Signed)
PROGRESS NOTE    Sandra Holloway  LYY:[id number] DOB: January 04, 1976 DOA: 11/03/2022 PCP: Dairl Ponder, MD      Brief Narrative:   46 y.o. 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    Assessment & Plan:   Principal Problem:   Acute and chr resp failure, unsp w hypoxia or hypercapnia (HCC) Active Problems:   Morbid obesity with BMI of 60.0-69.9, adult (HCC)   Acute on chronic respiratory failure with hypoxia (HCC)   H/O tracheostomy   OSA (obstructive sleep apnea)   #Acute on Chronic Hypoxic & Hypercapnic Respiratory Failure in setting of Pulmonary Edema due to noncompliance with Lasix vs ? 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 The use of NIV will treat patient's high PCO2 levels and can reduce the risk of exacerbations in future hospitalizations when used at night and during the day. PCO2 49 today 11/04/22, was 60 on 10/17/22, was 62 on 10/17/22)   Patient will need these advanced settings in conjunction with his current medication regimen :BiPAP with backup rate has been tried and failed.   Failure to have NIV available for use over 24hrs  Could lead to severe respiratory failure and cardiaca arrest and death   In my opinion the patient will benefit from nocturnal NIV likely decreasing the need for frequent readmissions. Noninvasive ventilation will also decrease morbidity and mortality in this  patient due to increasing carbon dioxide levels. The use of the NIV will treat patient's high PCO2 levels and can reduce risk of exacerbations in future hospitalizations when used in a mandatory fashion at night and as needed during the day.  Patient will need these advanced settings in conjunction with her current medication regimen.   Patient can maintain and clear their own airway.Patient is able to clear secretions and protect airway.   Failure to have NIV available for home use could lead to encephalopathy, cardiac arrest and death.   Current recommendation is for adapt VPAP or AVS, auto if possible.   Acute Decompensated HFpEF due to noncompliance with Lasix Hypertension Echocardiogram 10/18/2022: LVEF 55 to 60%, unable to evaluate diastolic function RV function norm  - home lasix resumed   CKD -Monitor I&O's / urinary output -Follow BMP -Ensure adequate renal perfusion -Avoid nephrotoxic agents as able -Replace electrolytes as indicated   Normocytic normochromic anemia without overt bleeding noted -Monitor for S/Sx of bleeding -Trend CBC -Lovenox for VTE Prophylaxis  -Transfuse for Hgb <7     DVT prophylaxis: lovenox Code Status: full Family Communication: none @ bedside  Level of care: Med-Surg Status is: Inpatient Remains inpatient appropriate because: d/c planning underway    Consultants:  pccm  Procedures: none  Antimicrobials:  none    Subjective: Breathing stable, says feels back to baseline  Objective: Vitals:   11/04/22 1000 11/04/22 1100 11/04/22 1200 11/04/22 1300  BP:      Pulse: 76 74 69 72  Resp:      Temp:      TempSrc:  SpO2: 96% 95% 95% 94%  Weight:      Height:        Intake/Output Summary (Last 24 hours) at 11/04/2022 1353 Last data filed at 11/04/2022 0600 Gross per 24 hour  Intake --  Output 2100 ml  Net -2100 ml   Filed Weights   11/03/22 1104 11/04/22 0500  Weight: (!) 181.3 kg (!) 181.3 kg     Examination:  General exam: Appears calm and comfortable  Respiratory system: rales at bases, trach in place Cardiovascular system: S1 & S2 heard, RRR. No JVD, murmurs, rubs, gallops or clicks.   Gastrointestinal system: Abdomen is obese, soft and nontender. No organomegaly or masses felt. Normal bowel sounds heard. Central nervous system: Alert and oriented. No focal neurological deficits. Extremities: Symmetric 5 x 5 power. Trace LE edema Skin: No rashes, lesions or ulcers Psychiatry: Judgement and insight appear normal. Mood & affect appropriate.     Data Reviewed: I have personally reviewed following labs and imaging studies  CBC: Recent Labs  Lab 11/03/22 1111 11/04/22 0442  WBC 9.1 6.5  NEUTROABS 6.9  --   HGB 10.6* 10.0*  HCT 34.0* 31.8*  MCV 89.5 86.9  PLT 189 197   Basic Metabolic Panel: Recent Labs  Lab 11/03/22 1111 11/04/22 0442  NA 138 138  K 4.7 3.7  CL 103 99  CO2 27 31  GLUCOSE 97 104*  BUN 19 20  CREATININE 1.19* 1.23*  CALCIUM 8.7* 8.9  MG  --  1.8  PHOS  --  4.9*   GFR: Estimated Creatinine Clearance: 92.6 mL/min (A) (by C-G formula based on SCr of 1.23 mg/dL (H)). Liver Function Tests: Recent Labs  Lab 11/03/22 1111  AST 18  ALT 16  ALKPHOS 51  BILITOT 0.7  PROT 7.0  ALBUMIN 3.5   No results for input(s): "LIPASE", "AMYLASE" in the last 168 hours. No results for input(s): "AMMONIA" in the last 168 hours. Coagulation Profile: No results for input(s): "INR", "PROTIME" in the last 168 hours. Cardiac Enzymes: No results for input(s): "CKTOTAL", "CKMB", "CKMBINDEX", "TROPONINI" in the last 168 hours. BNP (last 3 results) No results for input(s): "PROBNP" in the last 8760 hours. HbA1C: No results for input(s): "HGBA1C" in the last 72 hours. CBG: No results for input(s): "GLUCAP" in the last 168 hours. Lipid Profile: No results for input(s): "CHOL", "HDL", "LDLCALC", "TRIG", "CHOLHDL", "LDLDIRECT" in the last 72 hours. Thyroid  Function Tests: No results for input(s): "TSH", "T4TOTAL", "FREET4", "T3FREE", "THYROIDAB" in the last 72 hours. Anemia Panel: No results for input(s): "VITAMINB12", "FOLATE", "FERRITIN", "TIBC", "IRON", "RETICCTPCT" in the last 72 hours. Urine analysis:    Component Value Date/Time   COLORURINE STRAW (A) 10/17/2022 1059   APPEARANCEUR CLEAR (A) 10/17/2022 1059   LABSPEC 1.011 10/17/2022 1059   PHURINE 5.0 10/17/2022 1059   GLUCOSEU NEGATIVE 10/17/2022 1059   HGBUR NEGATIVE 10/17/2022 1059   BILIRUBINUR NEGATIVE 10/17/2022 1059   KETONESUR NEGATIVE 10/17/2022 1059   PROTEINUR 100 (A) 10/17/2022 1059   NITRITE NEGATIVE 10/17/2022 1059   LEUKOCYTESUR NEGATIVE 10/17/2022 1059   Sepsis Labs: @LABRCNTIP (procalcitonin:4,lacticidven:4)  ) Recent Results (from the past 240 hour(s))  Resp Panel by RT-PCR (Flu A&B, Covid) Anterior Nasal Swab     Status: None   Collection Time: 11/03/22 11:11 AM   Specimen: Anterior Nasal Swab  Result Value Ref Range Status   SARS Coronavirus 2 by RT PCR NEGATIVE NEGATIVE Final    Comment: (NOTE) SARS-CoV-2 target nucleic acids are NOT DETECTED.  The SARS-CoV-2 RNA is generally detectable in upper respiratory specimens during the acute phase of infection. The lowest concentration of SARS-CoV-2 viral copies this assay can detect is 138 copies/mL. A negative result does not preclude SARS-Cov-2 infection and should not be used as the sole basis for treatment or other patient management decisions. A negative result may occur with  improper specimen collection/handling, submission of specimen other than nasopharyngeal swab, presence of viral mutation(s) within the areas targeted by this assay, and inadequate number of viral copies(<138 copies/mL). A negative result must be combined with clinical observations, patient history, and epidemiological information. The expected result is Negative.  Fact Sheet for Patients:   BloggerCourse.com  Fact Sheet for Healthcare Providers:  SeriousBroker.it  This test is no t yet approved or cleared by the [place] FDA and  has been authorized for detection and/or diagnosis of SARS-CoV-2 by FDA under an Emergency Use Authorization (EUA). This EUA will remain  in effect (meaning this test can be used) for the duration of the COVID-19 declaration under Section 564(b)(1) of the Act, 21 U.S.C.section 360bbb-3(b)(1), unless the authorization is terminated  or revoked sooner.       Influenza A by PCR NEGATIVE NEGATIVE Final   Influenza B by PCR NEGATIVE NEGATIVE Final    Comment: (NOTE) The Xpert Xpress SARS-CoV-2/FLU/RSV plus assay is intended as an aid in the diagnosis of influenza from Nasopharyngeal swab specimens and should not be used as a sole basis for treatment. Nasal washings and aspirates are unacceptable for Xpert Xpress SARS-CoV-2/FLU/RSV testing.  Fact Sheet for Patients: BloggerCourse.com  Fact Sheet for Healthcare Providers: SeriousBroker.it  This test is not yet approved or cleared by the [place] FDA and has been authorized for detection and/or diagnosis of SARS-CoV-2 by FDA under an Emergency Use Authorization (EUA). This EUA will remain in effect (meaning this test can be used) for the duration of the COVID-19 declaration under Section 564(b)(1) of the Act, 21 U.S.C. section 360bbb-3(b)(1), unless the authorization is terminated or revoked.  Performed at [facility], [address] [place], [place] [address]   Culture, blood (routine x 2)     Status: None (Preliminary result)   Collection Time: 11/03/22 11:11 AM   Specimen: BLOOD  Result Value Ref Range Status   Specimen Description BLOOD RIGHT FA  Final   Special Requests   Final    BOTTLES DRAWN AEROBIC AND ANAEROBIC Blood Culture results may not be optimal due to  an inadequate volume of blood received in culture bottles   Culture   Final    NO GROWTH < 24 HOURS Performed at [facility], [address], [place], [place] [address]    Report Status PENDING  Incomplete  Culture, blood (routine x 2)     Status: None (Preliminary result)   Collection Time: 11/03/22 11:11 AM   Specimen: BLOOD  Result Value Ref Range Status   Specimen Description BLOOD LEFT HAND  Final   Special Requests   Final    BOTTLES DRAWN AEROBIC AND ANAEROBIC Blood Culture results may not be optimal due to an inadequate volume of blood received in culture bottles   Culture   Final    NO GROWTH < 24 HOURS Performed at [facility], [address], [place], [place] [address]    Report Status PENDING  Incomplete         Radiology Studies: DG Chest Port 1 View  Result Date: 11/03/2022 CLINICAL DATA:  Shortness of breath  EXAM: PORTABLE CHEST 1 VIEW COMPARISON:  Chest x-ray dated October 16, 2022 FINDINGS: Cardiac and mediastinal contours are unchanged. Tracheostomy tube in place. Diffuse airspace opacities are unchanged when compared with the prior exam possible new trace bilateral pleural effusions, patient body habitus somewhat limits evaluation. No evidence of pneumothorax. IMPRESSION: 1. Diffuse airspace opacities are unchanged when compared with the prior exam, likely due to pulmonary edema. 2. Possible new trace bilateral pleural effusions. Electronically Signed   By: Allegra Lai M.D.   On: 11/03/2022 12:44        Scheduled Meds:  Chlorhexidine Gluconate Cloth  6 each Topical Daily   cyclobenzaprine  5 mg Oral QHS   enoxaparin (LOVENOX) injection  0.5 mg/kg Subcutaneous Q24H   furosemide  40 mg Oral Daily   pantoprazole (PROTONIX) IV  40 mg Intravenous Q24H   sodium chloride flush  3 mL Intravenous Q12H   Continuous Infusions:  sodium chloride       LOS: 1 day     Silvano Bilis, MD [facility]   If 7PM-7AM, please contact  night-coverage [email or web address] Password [email or web address] 11/04/2022, 1:53 PM

## 2022-11-04 NOTE — Progress Notes (Signed)
PHARMACY CONSULT NOTE  Pharmacy Consult for Electrolyte Monitoring and Replacement   Recent Labs: Potassium (mmol/L)  Date Value  11/04/2022 3.7   Magnesium (mg/dL)  Date Value  40/98/1191 1.8   Calcium (mg/dL)  Date Value  47/82/9562 8.9   Albumin (g/dL)  Date Value  13/06/6577 3.5   Phosphorus (mg/dL)  Date Value  46/96/2952 4.9 (H)   Sodium (mmol/L)  Date Value  11/04/2022 138     Assessment: 46 year old female with a history of chronic respiratory failure with a chronic trach who was brought into the ED via EMS because of concerns of respiratory distress, hypoxemia and increased secretions. Pharmacy is asked to follow and replace electrolytes while in CCU   Goal of Therapy:  Electrolytes WNL  Plan:  2 grams IV magnesium sulfate x 1 Recheck electrolytes in am  Sandra Holloway ,PharmD Clinical Pharmacist 11/04/2022 7:39 AM

## 2022-11-04 NOTE — TOC Progression Note (Signed)
Transition of Care [facility]) - Progression Note    Patient Details  Name: Sandra Holloway MRN: [id number] Date of Birth: [birth year]  Transition of Care [facility] CM/SW Contact  Shelbie Hutching, RN Phone Number: 11/04/2022, 4:29 PM  Clinical Narrative:    Arbutus Leas with respiratory therapy with [employer] met with patient at the bedside.  Patient agrees to vent at home.  Home assessment to be completed tomorrow at 1:30 pm with her finance.  Overnight in hospital teaching with finance and patient will be Monday night, then patient can be discharged on Tuesday morning as long as everything goes to plan.  Suction ordered for patient as well, her suction machine at home is not working.     Expected Discharge Plan: Home/Self Care    Expected Discharge Plan and Services Expected Discharge Plan: Home/Self Care                         DME Arranged: NIV, Suction DME Agency: [employer] Date DME Agency Contacted: 11/04/22 Time DME Agency Contacted: [phone] Representative spoke with at DME Agency: [place] Determinants of Health ([place]) Interventions    Readmission Risk Interventions     No data to display

## 2022-11-04 NOTE — Plan of Care (Signed)
  Patient has severe end stage RESPIRATORY FAILURE  secondary to hypoxia and hypercapnia   Patient requires the use of NIV both nightly and daytime to help with exacerbation periods. The use of NIV will treat patient's high PCO2 levels and can reduce the risk of exacerbations in future hospitalizations when used at night and during the day. PCO2 75 11/03/2022  Patient will need these advanced settings in conjunction with his current medication regimen :BiPAP with backup rate has been tried and failed.  Failure to have NIV available for use over 24hrs  Could lead to severe respiratory failure and cardiaca arrest and death  In my opinion the patient will benefit from nocturnal NIV likely decreasing the need for frequent readmissions. Noninvasive ventilation will also decrease morbidity and mortality in this patient due to increasing carbon dioxide levels. The use of the NIV will treat patient's high PCO2 levels and can reduce risk of exacerbations in future hospitalizations when used in a mandatory fashion at night and as needed during the day.  Patient will need these advanced settings in conjunction with her current medication regimen.   Patient can maintain and clear their own airway.Patient is able to clear secretions and protect airway.   Failure to have NIV available for home use could lead to encephalopathy, cardiac arrest and death.   Current recommendation is for adapt VPAP or AVS, auto if possible.

## 2022-11-04 NOTE — TOC Initial Note (Signed)
Transition of Care [facility]) - Initial/Assessment Note    Patient Details  Name: Sandra Holloway MRN: [id number] Date of Birth: 1976/02/03  Transition of Care [facility]) CM/SW Contact:    Allayne Butcher, RN Phone Number: 11/04/2022, 9:09 AM  Clinical Narrative:                 Referral for NIV given to Elie Goody with [employer] yesterday.  Patient needs an ABG for qualification, MD notified.  Expected Discharge Plan: Home/Self Care     Patient Goals and CMS Choice        Expected Discharge Plan and Services Expected Discharge Plan: Home/Self Care                         DME Arranged: NIV DME Agency: [employer] Date DME Agency Contacted: 11/03/22 Time DME Agency Contacted: 1600 Representative spoke with at DME Agency: Elie Goody            Prior Living Arrangements/Services                       Activities of Daily Living Home Assistive Devices/Equipment: Vent/Trach supplies ADL Screening (condition at time of admission) Patient's cognitive ability adequate to safely complete daily activities?: Yes Is the patient deaf or have difficulty hearing?: No Does the patient have difficulty seeing, even when wearing glasses/contacts?: No Does the patient have difficulty concentrating, remembering, or making decisions?: No Patient able to express need for assistance with ADLs?: No Does the patient have difficulty dressing or bathing?: No Independently performs ADLs?: Yes (appropriate for developmental age) Does the patient have difficulty walking or climbing stairs?: Yes Weakness of Legs: Both Weakness of Arms/Hands: None  Permission Sought/Granted                  Emotional Assessment              Admission diagnosis:  Acute respiratory failure with hypoxia and hypercapnia (HCC) [J96.01, J96.02] Acute and chr resp failure, unsp w hypoxia or hypercapnia (HCC) [J96.20] Patient Active Problem List   Diagnosis Date Noted   Acute and chr resp failure,  unsp w hypoxia or hypercapnia (HCC) 11/03/2022   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 [place] [address] Phone: [phone] Fax: [phone]  [facility] [phone] [place], [place] - [address] [place] [address] Phone: [phone] Fax: [phone]     Social Determinants of Health (SDOH) Interventions    Readmission Risk Interventions     No data to display

## 2022-11-05 DIAGNOSIS — J9620 Acute and chronic respiratory failure, unspecified whether with hypoxia or hypercapnia: Secondary | ICD-10-CM | POA: Diagnosis not present

## 2022-11-05 LAB — BASIC METABOLIC PANEL
Anion gap: 9 (ref 5–15)
BUN: 23 mg/dL — ABNORMAL HIGH (ref 6–20)
CO2: 31 mmol/L (ref 22–32)
Calcium: 9.1 mg/dL (ref 8.9–10.3)
Chloride: 96 mmol/L — ABNORMAL LOW (ref 98–111)
Creatinine, Ser: 1.41 mg/dL — ABNORMAL HIGH (ref 0.44–1.00)
GFR, Estimated: 47 mL/min — ABNORMAL LOW (ref >60)
Glucose, Bld: 119 mg/dL — ABNORMAL HIGH (ref 70–99)
Potassium: 3.9 mmol/L (ref 3.5–5.1)
Sodium: 136 mmol/L (ref 135–145)

## 2022-11-05 LAB — CBC
HCT: 34.8 % — ABNORMAL LOW (ref 36.0–46.0)
Hemoglobin: 11.1 g/dL — ABNORMAL LOW (ref 12.0–15.0)
MCH: 27.7 pg (ref 26.0–34.0)
MCHC: 31.9 g/dL (ref 30.0–36.0)
MCV: 86.8 fL (ref 80.0–100.0)
Platelets: 204 10*3/uL (ref 150–400)
RBC: 4.01 MIL/uL (ref 3.87–5.11)
RDW: 14.6 % (ref 11.5–15.5)
WBC: 7.1 10*3/uL (ref 4.0–10.5)
nRBC: 0 % (ref 0.0–0.2)

## 2022-11-05 LAB — PHOSPHORUS: Phosphorus: 5.1 mg/dL — ABNORMAL HIGH (ref 2.5–4.6)

## 2022-11-05 LAB — MAGNESIUM: Magnesium: 2.1 mg/dL (ref 1.7–2.4)

## 2022-11-05 MED ORDER — HYDROXYZINE HCL 25 MG PO TABS
50.0000 mg | ORAL_TABLET | Freq: Every evening | ORAL | Status: DC | PRN
  Administered 2022-11-05 – 2022-11-08 (×4): 50 mg via ORAL
  Filled 2022-11-05 (×4): qty 2

## 2022-11-05 MED ORDER — TERBINAFINE HCL 250 MG PO TABS
250.0000 mg | ORAL_TABLET | Freq: Every day | ORAL | Status: DC
  Administered 2022-11-05 – 2022-11-09 (×5): 250 mg via ORAL
  Filled 2022-11-05 (×5): qty 1

## 2022-11-05 NOTE — TOC Progression Note (Signed)
Transition of Care [facility]) - Progression Note    Patient Details  Name: Sandra Holloway MRN: [id number] Date of Birth: 09-May-1976  Transition of Care [facility]) CM/SW Contact  Chapman Fitch, RN Phone Number: 11/05/2022, 2:20 PM  Clinical Narrative:     Per Baxter Hire with [employer] home inspection passed and overnight teaching will occur on Monday    Expected Discharge Plan: Home/Self Care    Expected Discharge Plan and Services Expected Discharge Plan: Home/Self Care                         DME Arranged: NIV, Suction DME Agency: [employer] Date DME Agency Contacted: 11/04/22 Time DME Agency Contacted: [phone] Representative spoke with at DME Agency: Elie Goody             Social Determinants of Health (SDOH) Interventions    Readmission Risk Interventions     No data to display

## 2022-11-05 NOTE — Progress Notes (Signed)
PROGRESS NOTE    Sandra Holloway  QVZ:[id number] DOB: 10-12-1976 DOA: 11/03/2022 PCP: Dairl Ponder, MD      Brief Narrative:   46 y.o. 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    Assessment & Plan:   Principal Problem:   Acute and chr resp failure, unsp w hypoxia or hypercapnia (HCC) Active Problems:   Morbid obesity with BMI of 60.0-69.9, adult (HCC)   Acute on chronic respiratory failure with hypoxia (HCC)   H/O tracheostomy   OSA (obstructive sleep apnea)   #Acute on Chronic Hypoxic & Hypercapnic Respiratory Failure in setting of Pulmonary Edema due to noncompliance with Lasix vs ? 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 Home health agency to perform overnight trial on Monday, can d/c Tuesday     Acute Decompensated HFpEF due to noncompliance with Lasix Hypertension Echocardiogram 10/18/2022: LVEF 55 to 60%, unable to evaluate diastolic function RV function norm  - home lasix resumed   CKD 3a Stable - monitor   Morbid obesity Noted  Intertrigo Possibly candidal - start terbinafine   DVT prophylaxis: lovenox Code Status: full Family Communication: none @ bedside  Level of care: Stepdown Status is: Inpatient Remains inpatient appropriate because: need to perform inpatient NIV trial prior to d/c    Consultants:  pccm  Procedures: none  Antimicrobials:  none    Subjective: Breathing stable, says feels back to baseline  Objective: Vitals:   11/05/22 0444 11/05/22 0729 11/05/22 0900 11/05/22 0930  BP:    (!) 154/87   Pulse:   63   Resp:      Temp:   98.7 F (37.1 C)   TempSrc:   Oral   SpO2:  98% 94%   Weight: (!) 177.1 kg     Height:        Intake/Output Summary (Last 24 hours) at 11/05/2022 1418 Last data filed at 11/05/2022 0600 Gross per 24 hour  Intake --  Output 1250 ml  Net -1250 ml   Filed Weights   11/03/22 1104 11/04/22 0500 11/05/22 0444  Weight: (!) 181.3 kg (!) 181.3 kg (!) 177.1 kg    Examination:  General exam: Appears calm and comfortable  Respiratory system: rales at bases, trach in place Cardiovascular system: S1 & S2 heard, RRR. No JVD, murmurs, rubs, gallops or clicks.   Gastrointestinal system: Abdomen is obese, soft and nontender. No organomegaly or masses felt. Normal bowel sounds heard. Central nervous system: Alert and oriented. No focal neurological deficits. Extremities: Symmetric 5 x 5 power. Trace LE edema Skin: erythematous rash under breasts Psychiatry: Judgement and insight appear normal. Mood & affect appropriate.     Data Reviewed: I have personally reviewed following labs and imaging studies  CBC: Recent Labs  Lab 11/03/22 1111 11/04/22 0442 11/05/22 0325  WBC 9.1 6.5 7.1  NEUTROABS 6.9  --   --   HGB 10.6* 10.0* 11.1*  HCT 34.0* 31.8* 34.8*  MCV 89.5 86.9 86.8  PLT 189 197 204   Basic Metabolic Panel: Recent Labs  Lab 11/03/22 1111 11/04/22 0442 11/05/22 0325  NA 138 138 136  K 4.7 3.7 3.9  CL 103  99 96*  CO2 27 31 31   GLUCOSE 97 104* 119*  BUN 19 20 23*  CREATININE 1.19* 1.23* 1.41*  CALCIUM 8.7* 8.9 9.1  MG  --  1.8 2.1  PHOS  --  4.9* 5.1*   GFR: Estimated Creatinine Clearance: 79.4 mL/min (A) (by C-G formula based on SCr of 1.41 mg/dL (H)). Liver Function Tests: Recent Labs  Lab 11/03/22 1111  AST 18  ALT 16  ALKPHOS 51  BILITOT 0.7  PROT 7.0  ALBUMIN 3.5   No results for input(s): "LIPASE", "AMYLASE" in the last 168 hours. No results for input(s): "AMMONIA" in the last 168 hours. Coagulation Profile: No  results for input(s): "INR", "PROTIME" in the last 168 hours. Cardiac Enzymes: No results for input(s): "CKTOTAL", "CKMB", "CKMBINDEX", "TROPONINI" in the last 168 hours. BNP (last 3 results) No results for input(s): "PROBNP" in the last 8760 hours. HbA1C: No results for input(s): "HGBA1C" in the last 72 hours. CBG: No results for input(s): "GLUCAP" in the last 168 hours. Lipid Profile: No results for input(s): "CHOL", "HDL", "LDLCALC", "TRIG", "CHOLHDL", "LDLDIRECT" in the last 72 hours. Thyroid Function Tests: No results for input(s): "TSH", "T4TOTAL", "FREET4", "T3FREE", "THYROIDAB" in the last 72 hours. Anemia Panel: No results for input(s): "VITAMINB12", "FOLATE", "FERRITIN", "TIBC", "IRON", "RETICCTPCT" in the last 72 hours. Urine analysis:    Component Value Date/Time   COLORURINE STRAW (A) 10/17/2022 1059   APPEARANCEUR CLEAR (A) 10/17/2022 1059   LABSPEC 1.011 10/17/2022 1059   PHURINE 5.0 10/17/2022 1059   GLUCOSEU NEGATIVE 10/17/2022 1059   HGBUR NEGATIVE 10/17/2022 1059   BILIRUBINUR NEGATIVE 10/17/2022 1059   KETONESUR NEGATIVE 10/17/2022 1059   PROTEINUR 100 (A) 10/17/2022 1059   NITRITE NEGATIVE 10/17/2022 1059   LEUKOCYTESUR NEGATIVE 10/17/2022 1059   Sepsis Labs: @LABRCNTIP (procalcitonin:4,lacticidven:4)  ) Recent Results (from the past 240 hour(s))  Resp Panel by RT-PCR (Flu A&B, Covid) Anterior Nasal Swab     Status: None   Collection Time: 11/03/22 11:11 AM   Specimen: Anterior Nasal Swab  Result Value Ref Range Status   SARS Coronavirus 2 by RT PCR NEGATIVE NEGATIVE Final    Comment: (NOTE) SARS-CoV-2 target nucleic acids are NOT DETECTED.  The SARS-CoV-2 RNA is generally detectable in upper respiratory specimens during the acute phase of infection. The lowest concentration of SARS-CoV-2 viral copies this assay can detect is 138 copies/mL. A negative result does not preclude SARS-Cov-2 infection and should not be used as the sole basis for treatment  or other patient management decisions. A negative result may occur with  improper specimen collection/handling, submission of specimen other than nasopharyngeal swab, presence of viral mutation(s) within the areas targeted by this assay, and inadequate number of viral copies(<138 copies/mL). A negative result must be combined with clinical observations, patient history, and epidemiological information. The expected result is Negative.  Fact Sheet for Patients:   Fact Sheet for Healthcare Providers:  14/06/23  This test is no t yet approved or cleared by the [employer] FDA and  has been authorized for detection and/or diagnosis of SARS-CoV-2 by FDA under an Emergency Use Authorization (EUA). This EUA will remain  in effect (meaning this test can be used) for the duration of the COVID-19 declaration under Section 564(b)(1) of the Act, 21 U.S.C.section 360bbb-3(b)(1), unless the authorization is terminated  or revoked sooner.       Influenza A by PCR NEGATIVE NEGATIVE Final   Influenza B by PCR NEGATIVE NEGATIVE Final    Comment: (NOTE)  The Xpert Xpress SARS-CoV-2/FLU/RSV plus assay is intended as an aid in the diagnosis of influenza from Nasopharyngeal swab specimens and should not be used as a sole basis for treatment. Nasal washings and aspirates are unacceptable for Xpert Xpress SARS-CoV-2/FLU/RSV testing.  Fact Sheet for Patients: BloggerCourse.com  Fact Sheet for Healthcare Providers: SeriousBroker.it  This test is not yet approved or cleared by the [place] FDA and has been authorized for detection and/or diagnosis of SARS-CoV-2 by FDA under an Emergency Use Authorization (EUA). This EUA will remain in effect (meaning this test can be used) for the duration of the COVID-19 declaration under Section 564(b)(1) of the Act, 21 U.S.C. section  360bbb-3(b)(1), unless the authorization is terminated or revoked.  Performed at [facility], [address] [place], [place] [address]   Culture, blood (routine x 2)     Status: None (Preliminary result)   Collection Time: 11/03/22 11:11 AM   Specimen: BLOOD  Result Value Ref Range Status   Specimen Description BLOOD RIGHT FA  Final   Special Requests   Final    BOTTLES DRAWN AEROBIC AND ANAEROBIC Blood Culture results may not be optimal due to an inadequate volume of blood received in culture bottles   Culture   Final    NO GROWTH 2 DAYS Performed at [facility], [address] [place], [place] [address]    Report Status PENDING  Incomplete  Culture, blood (routine x 2)     Status: None (Preliminary result)   Collection Time: 11/03/22 11:11 AM   Specimen: BLOOD  Result Value Ref Range Status   Specimen Description BLOOD LEFT HAND  Final   Special Requests   Final    BOTTLES DRAWN AEROBIC AND ANAEROBIC Blood Culture results may not be optimal due to an inadequate volume of blood received in culture bottles   Culture   Final    NO GROWTH 2 DAYS Performed at [facility], [address], [place], [place] [address]    Report Status PENDING  Incomplete         Radiology Studies: No results found.      Scheduled Meds:  Chlorhexidine Gluconate Cloth  6 each Topical Daily   cyclobenzaprine  5 mg Oral QHS   enoxaparin (LOVENOX) injection  0.5 mg/kg Subcutaneous Q24H   furosemide  40 mg Oral Daily   pantoprazole (PROTONIX) IV  40 mg Intravenous Q24H   sodium chloride flush  3 mL Intravenous Q12H   terbinafine  250 mg Oral Daily   Continuous Infusions:  sodium chloride       LOS: 2 days     Silvano Bilis, MD [facility]   If 7PM-7AM, please contact night-coverage [email or web address] Password [email or web address] 11/05/2022, 2:18 PM

## 2022-11-06 DIAGNOSIS — J9620 Acute and chronic respiratory failure, unspecified whether with hypoxia or hypercapnia: Secondary | ICD-10-CM | POA: Diagnosis not present

## 2022-11-06 MED ORDER — PANTOPRAZOLE SODIUM 40 MG PO TBEC
40.0000 mg | DELAYED_RELEASE_TABLET | Freq: Every day | ORAL | Status: DC
  Administered 2022-11-07 – 2022-11-09 (×3): 40 mg via ORAL
  Filled 2022-11-06 (×3): qty 1

## 2022-11-06 NOTE — Progress Notes (Signed)
PHARMACIST - PHYSICIAN COMMUNICATION  CONCERNING: IV to Oral Route Change Policy  RECOMMENDATION: This patient is receiving pantoprazole by the intravenous route.  Based on criteria approved by the Pharmacy and Therapeutics Committee, the intravenous medication(s) is/are being converted to the equivalent oral dose form(s).   DESCRIPTION: These criteria include: The patient is eating (either orally or via tube) and/or has been taking other orally administered medications for a least 24 hours The patient has no evidence of active gastrointestinal bleeding or impaired GI absorption (gastrectomy, short bowel, patient on TNA or NPO).  If you have questions about this conversion, please contact the Pharmacy Department   Tressie Ellis, [facility] 11/06/2022 10:56 AM

## 2022-11-06 NOTE — Progress Notes (Signed)
PROGRESS NOTE    Sandra Holloway  OTL:[id number] DOB: 1976/09/10 DOA: 11/03/2022 PCP: Dairl Ponder, MD      Brief Narrative:   46 y.o. 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    Assessment & Plan:   Principal Problem:   Acute and chr resp failure, unsp w hypoxia or hypercapnia (HCC) Active Problems:   Morbid obesity with BMI of 60.0-69.9, adult (HCC)   Acute on chronic respiratory failure with hypoxia (HCC)   H/O tracheostomy   OSA (obstructive sleep apnea)   #Acute on Chronic Hypoxic & Hypercapnic Respiratory Failure in setting of Pulmonary Edema due to noncompliance with Lasix vs ? 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 Home health agency to perform overnight trial on Monday, can d/c Tuesday    Acute Decompensated HFpEF due to noncompliance with Lasix Hypertension Echocardiogram 10/18/2022: LVEF 55 to 60%, unable to evaluate diastolic function RV function norm  - home lasix resumed   CKD 3a Stable - monitor   Morbid obesity Noted  Intertrigo Possibly candidal - started terbinafine 12/8   DVT prophylaxis: lovenox Code Status: full Family Communication: none @ bedside  Level of care: Stepdown Status is: Inpatient Remains inpatient appropriate because: need to perform inpatient NIV trial prior to d/c    Consultants:  pccm  Procedures: none  Antimicrobials:  none    Subjective: Breathing stable, says feels back to baseline, less anxious last night  Objective: Vitals:   11/06/22 0400 11/06/22 0500 11/06/22 0525 11/06/22  0600  BP:   (!) 98/49 (!) 107/57  Pulse: 66 60 61 64  Resp: 13 17 14 14   Temp:   98.3 F (36.8 C)   TempSrc:      SpO2: 96% 99% 96% 96%  Weight:  (!) 181.7 kg    Height:        Intake/Output Summary (Last 24 hours) at 11/06/2022 1232 Last data filed at 11/06/2022 0700 Gross per 24 hour  Intake --  Output 950 ml  Net -950 ml   Filed Weights   11/04/22 0500 11/05/22 0444 11/06/22 0500  Weight: (!) 181.3 kg (!) 177.1 kg (!) 181.7 kg    Examination:  General exam: Appears calm and comfortable  Respiratory system: rales at bases, trach in place Cardiovascular system: S1 & S2 heard, RRR. No JVD, murmurs, rubs, gallops or clicks.   Gastrointestinal system: Abdomen is obese, soft and nontender. No organomegaly or masses felt. Normal bowel sounds heard. Central nervous system: Alert and oriented. No focal neurological deficits. Extremities: Symmetric 5 x 5 power. Trace LE edema Skin: erythematous rash under breasts Psychiatry: Judgement and insight appear normal. Mood & affect appropriate.     Data Reviewed: I have personally reviewed following labs and imaging studies  CBC: Recent Labs  Lab 11/03/22 1111 11/04/22 0442 11/05/22 0325  WBC 9.1 6.5 7.1  NEUTROABS 6.9  --   --   HGB 10.6* 10.0* 11.1*  HCT 34.0* 31.8* 34.8*  MCV 89.5 86.9 86.8  PLT 189 197 204   Basic Metabolic Panel: Recent Labs  Lab 11/03/22 1111 11/04/22 0442 11/05/22 0325  NA 138 138 136  K 4.7  3.7 3.9  CL 103 99 96*  CO2 27 31 31   GLUCOSE 97 104* 119*  BUN 19 20 23*  CREATININE 1.19* 1.23* 1.41*  CALCIUM 8.7* 8.9 9.1  MG  --  1.8 2.1  PHOS  --  4.9* 5.1*   GFR: Estimated Creatinine Clearance: 80.8 mL/min (A) (by C-G formula based on SCr of 1.41 mg/dL (H)). Liver Function Tests: Recent Labs  Lab 11/03/22 1111  AST 18  ALT 16  ALKPHOS 51  BILITOT 0.7  PROT 7.0  ALBUMIN 3.5   No results for input(s): "LIPASE", "AMYLASE" in the last 168 hours. No results for input(s): "AMMONIA" in  the last 168 hours. Coagulation Profile: No results for input(s): "INR", "PROTIME" in the last 168 hours. Cardiac Enzymes: No results for input(s): "CKTOTAL", "CKMB", "CKMBINDEX", "TROPONINI" in the last 168 hours. BNP (last 3 results) No results for input(s): "PROBNP" in the last 8760 hours. HbA1C: No results for input(s): "HGBA1C" in the last 72 hours. CBG: No results for input(s): "GLUCAP" in the last 168 hours. Lipid Profile: No results for input(s): "CHOL", "HDL", "LDLCALC", "TRIG", "CHOLHDL", "LDLDIRECT" in the last 72 hours. Thyroid Function Tests: No results for input(s): "TSH", "T4TOTAL", "FREET4", "T3FREE", "THYROIDAB" in the last 72 hours. Anemia Panel: No results for input(s): "VITAMINB12", "FOLATE", "FERRITIN", "TIBC", "IRON", "RETICCTPCT" in the last 72 hours. Urine analysis:    Component Value Date/Time   COLORURINE STRAW (A) 10/17/2022 1059   APPEARANCEUR CLEAR (A) 10/17/2022 1059   LABSPEC 1.011 10/17/2022 1059   PHURINE 5.0 10/17/2022 1059   GLUCOSEU NEGATIVE 10/17/2022 1059   HGBUR NEGATIVE 10/17/2022 1059   BILIRUBINUR NEGATIVE 10/17/2022 1059   KETONESUR NEGATIVE 10/17/2022 1059   PROTEINUR 100 (A) 10/17/2022 1059   NITRITE NEGATIVE 10/17/2022 1059   LEUKOCYTESUR NEGATIVE 10/17/2022 1059   Sepsis Labs: @LABRCNTIP (procalcitonin:4,lacticidven:4)  ) Recent Results (from the past 240 hour(s))  Resp Panel by RT-PCR (Flu A&B, Covid) Anterior Nasal Swab     Status: None   Collection Time: 11/03/22 11:11 AM   Specimen: Anterior Nasal Swab  Result Value Ref Range Status   SARS Coronavirus 2 by RT PCR NEGATIVE NEGATIVE Final    Comment: (NOTE) SARS-CoV-2 target nucleic acids are NOT DETECTED.  The SARS-CoV-2 RNA is generally detectable in upper respiratory specimens during the acute phase of infection. The lowest concentration of SARS-CoV-2 viral copies this assay can detect is 138 copies/mL. A negative result does not preclude SARS-Cov-2 infection and  should not be used as the sole basis for treatment or other patient management decisions. A negative result may occur with  improper specimen collection/handling, submission of specimen other than nasopharyngeal swab, presence of viral mutation(s) within the areas targeted by this assay, and inadequate number of viral copies(<138 copies/mL). A negative result must be combined with clinical observations, patient history, and epidemiological information. The expected result is Negative.  Fact Sheet for Patients:   Fact Sheet for Healthcare Providers:  14/06/23  This test is no t yet approved or cleared by the [employer] FDA and  has been authorized for detection and/or diagnosis of SARS-CoV-2 by FDA under an Emergency Use Authorization (EUA). This EUA will remain  in effect (meaning this test can be used) for the duration of the COVID-19 declaration under Section 564(b)(1) of the Act, 21 U.S.C.section 360bbb-3(b)(1), unless the authorization is terminated  or revoked sooner.       Influenza A by PCR NEGATIVE NEGATIVE Final   Influenza B by PCR NEGATIVE NEGATIVE Final  Comment: (NOTE) The Xpert Xpress SARS-CoV-2/FLU/RSV plus assay is intended as an aid in the diagnosis of influenza from Nasopharyngeal swab specimens and should not be used as a sole basis for treatment. Nasal washings and aspirates are unacceptable for Xpert Xpress SARS-CoV-2/FLU/RSV testing.  Fact Sheet for Patients: BloggerCourse.com  Fact Sheet for Healthcare Providers: SeriousBroker.it  This test is not yet approved or cleared by the [place] FDA and has been authorized for detection and/or diagnosis of SARS-CoV-2 by FDA under an Emergency Use Authorization (EUA). This EUA will remain in effect (meaning this test can be used) for the duration of the COVID-19 declaration  under Section 564(b)(1) of the Act, 21 U.S.C. section 360bbb-3(b)(1), unless the authorization is terminated or revoked.  Performed at [facility], [address] [place], [place] [address]   Culture, blood (routine x 2)     Status: None (Preliminary result)   Collection Time: 11/03/22 11:11 AM   Specimen: BLOOD  Result Value Ref Range Status   Specimen Description BLOOD RIGHT FA  Final   Special Requests   Final    BOTTLES DRAWN AEROBIC AND ANAEROBIC Blood Culture results may not be optimal due to an inadequate volume of blood received in culture bottles   Culture   Final    NO GROWTH 3 DAYS Performed at [facility], [address] [place], [place] [address]    Report Status PENDING  Incomplete  Culture, blood (routine x 2)     Status: None (Preliminary result)   Collection Time: 11/03/22 11:11 AM   Specimen: BLOOD  Result Value Ref Range Status   Specimen Description BLOOD LEFT HAND  Final   Special Requests   Final    BOTTLES DRAWN AEROBIC AND ANAEROBIC Blood Culture results may not be optimal due to an inadequate volume of blood received in culture bottles   Culture   Final    NO GROWTH 3 DAYS Performed at [facility], [address] [place], [place] [address]    Report Status PENDING  Incomplete         Radiology Studies: No results found.      Scheduled Meds:  Chlorhexidine Gluconate Cloth  6 each Topical Daily   cyclobenzaprine  5 mg Oral QHS   enoxaparin (LOVENOX) injection  0.5 mg/kg Subcutaneous Q24H   furosemide  40 mg Oral Daily   [START ON 11/07/2022] pantoprazole  40 mg Oral Daily   sodium chloride flush  3 mL Intravenous Q12H   terbinafine  250 mg Oral Daily   Continuous Infusions:  sodium chloride       LOS: 3 days     Silvano Bilis, MD [facility]   If 7PM-7AM, please contact night-coverage [email or web address] Password [facility] 11/06/2022, 12:32 PM

## 2022-11-07 DIAGNOSIS — J9620 Acute and chronic respiratory failure, unspecified whether with hypoxia or hypercapnia: Secondary | ICD-10-CM | POA: Diagnosis not present

## 2022-11-07 NOTE — Progress Notes (Signed)
PROGRESS NOTE    Macall Mccroskey  KVQ:[id number] DOB: 07-May-1976 DOA: 11/03/2022 PCP: Dairl Ponder, MD      Brief Narrative:   46 y.o. 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    Assessment & Plan:   Principal Problem:   Acute and chr resp failure, unsp w hypoxia or hypercapnia (HCC) Active Problems:   Morbid obesity with BMI of 60.0-69.9, adult (HCC)   Acute on chronic respiratory failure with hypoxia (HCC)   H/O tracheostomy   OSA (obstructive sleep apnea)   #Acute on Chronic Hypoxic & Hypercapnic Respiratory Failure in setting of Pulmonary Edema due to noncompliance with Lasix vs ? 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 Home health agency to perform overnight trial on Monday, can d/c Tuesday if all goes well. They are set to arrive tomorrow at 3 PM and pt's fiance will also be there for teaching    Acute Decompensated HFpEF due to noncompliance with Lasix Hypertension Echocardiogram 10/18/2022: LVEF 55 to 60%, unable to evaluate diastolic function RV function norm  - home lasix resumed   CKD 3a Stable - monitor   Morbid obesity Noted  Intertrigo Possibly candidal - started terbinafine 12/8   DVT prophylaxis: lovenox Code Status: full Family Communication: none @ bedside  Level of care: Stepdown Status is: Inpatient Remains inpatient appropriate because: need to perform inpatient NIV trial prior to d/c    Consultants:  pccm  Procedures: none  Antimicrobials:  none    Subjective: Breathing stable, no  complaints  Objective: Vitals:   11/07/22 0600 11/07/22 0641 11/07/22 0700 11/07/22 0800  BP:  (!) 142/85  135/77  Pulse: 66 73    Resp: 13 20 17    Temp:    (!) 97.5 F (36.4 C)  TempSrc:    Oral  SpO2: 95% 93% 98%   Weight:      Height:        Intake/Output Summary (Last 24 hours) at 11/07/2022 1258 Last data filed at 11/07/2022 1113 Gross per 24 hour  Intake 403 ml  Output 2150 ml  Net -1747 ml   Filed Weights   11/05/22 0444 11/06/22 0500 11/07/22 0345  Weight: (!) 177.1 kg (!) 181.7 kg (!) 181.8 kg    Examination:  General exam: Appears calm and comfortable  Respiratory system: rales at bases, trach in place Cardiovascular system: S1 & S2 heard, RRR. No JVD, murmurs, rubs, gallops or clicks.   Gastrointestinal system: Abdomen is obese, soft and nontender. No organomegaly or masses felt. Normal bowel sounds heard. Central nervous system: Alert and oriented. No focal neurological deficits. Extremities: Symmetric 5 x 5 power. Trace LE edema Skin: erythematous rash under breasts Psychiatry: Judgement and insight appear normal. Mood & affect appropriate.     Data Reviewed: I have personally reviewed following labs and imaging studies  CBC: Recent Labs  Lab 11/03/22 1111 11/04/22 0442 11/05/22 0325  WBC 9.1 6.5 7.1  NEUTROABS 6.9  --   --   HGB 10.6* 10.0* 11.1*  HCT 34.0* 31.8* 34.8*  MCV 89.5 86.9 86.8  PLT 189 197 204   Basic Metabolic Panel: Recent Labs  Lab  11/03/22 1111 11/04/22 0442 11/05/22 0325  NA 138 138 136  K 4.7 3.7 3.9  CL 103 99 96*  CO2 27 31 31   GLUCOSE 97 104* 119*  BUN 19 20 23*  CREATININE 1.19* 1.23* 1.41*  CALCIUM 8.7* 8.9 9.1  MG  --  1.8 2.1  PHOS  --  4.9* 5.1*   GFR: Estimated Creatinine Clearance: 80.9 mL/min (A) (by C-G formula based on SCr of 1.41 mg/dL (H)). Liver Function Tests: Recent Labs  Lab 11/03/22 1111  AST 18  ALT 16  ALKPHOS 51  BILITOT 0.7  PROT 7.0  ALBUMIN 3.5   No results for input(s):  "LIPASE", "AMYLASE" in the last 168 hours. No results for input(s): "AMMONIA" in the last 168 hours. Coagulation Profile: No results for input(s): "INR", "PROTIME" in the last 168 hours. Cardiac Enzymes: No results for input(s): "CKTOTAL", "CKMB", "CKMBINDEX", "TROPONINI" in the last 168 hours. BNP (last 3 results) No results for input(s): "PROBNP" in the last 8760 hours. HbA1C: No results for input(s): "HGBA1C" in the last 72 hours. CBG: No results for input(s): "GLUCAP" in the last 168 hours. Lipid Profile: No results for input(s): "CHOL", "HDL", "LDLCALC", "TRIG", "CHOLHDL", "LDLDIRECT" in the last 72 hours. Thyroid Function Tests: No results for input(s): "TSH", "T4TOTAL", "FREET4", "T3FREE", "THYROIDAB" in the last 72 hours. Anemia Panel: No results for input(s): "VITAMINB12", "FOLATE", "FERRITIN", "TIBC", "IRON", "RETICCTPCT" in the last 72 hours. Urine analysis:    Component Value Date/Time   COLORURINE STRAW (A) 10/17/2022 1059   APPEARANCEUR CLEAR (A) 10/17/2022 1059   LABSPEC 1.011 10/17/2022 1059   PHURINE 5.0 10/17/2022 1059   GLUCOSEU NEGATIVE 10/17/2022 1059   HGBUR NEGATIVE 10/17/2022 1059   BILIRUBINUR NEGATIVE 10/17/2022 1059   KETONESUR NEGATIVE 10/17/2022 1059   PROTEINUR 100 (A) 10/17/2022 1059   NITRITE NEGATIVE 10/17/2022 1059   LEUKOCYTESUR NEGATIVE 10/17/2022 1059   Sepsis Labs: @LABRCNTIP (procalcitonin:4,lacticidven:4)  ) Recent Results (from the past 240 hour(s))  Resp Panel by RT-PCR (Flu A&B, Covid) Anterior Nasal Swab     Status: None   Collection Time: 11/03/22 11:11 AM   Specimen: Anterior Nasal Swab  Result Value Ref Range Status   SARS Coronavirus 2 by RT PCR NEGATIVE NEGATIVE Final    Comment: (NOTE) SARS-CoV-2 target nucleic acids are NOT DETECTED.  The SARS-CoV-2 RNA is generally detectable in upper respiratory specimens during the acute phase of infection. The lowest concentration of SARS-CoV-2 viral copies this assay can detect  is 138 copies/mL. A negative result does not preclude SARS-Cov-2 infection and should not be used as the sole basis for treatment or other patient management decisions. A negative result may occur with  improper specimen collection/handling, submission of specimen other than nasopharyngeal swab, presence of viral mutation(s) within the areas targeted by this assay, and inadequate number of viral copies(<138 copies/mL). A negative result must be combined with clinical observations, patient history, and epidemiological information. The expected result is Negative.  Fact Sheet for Patients:   Fact Sheet for Healthcare Providers:  14/06/23  This test is no t yet approved or cleared by the [employer] FDA and  has been authorized for detection and/or diagnosis of SARS-CoV-2 by FDA under an Emergency Use Authorization (EUA). This EUA will remain  in effect (meaning this test can be used) for the duration of the COVID-19 declaration under Section 564(b)(1) of the Act, 21 U.S.C.section 360bbb-3(b)(1), unless the authorization is terminated  or revoked sooner.       Influenza A  by PCR NEGATIVE NEGATIVE Final   Influenza B by PCR NEGATIVE NEGATIVE Final    Comment: (NOTE) The Xpert Xpress SARS-CoV-2/FLU/RSV plus assay is intended as an aid in the diagnosis of influenza from Nasopharyngeal swab specimens and should not be used as a sole basis for treatment. Nasal washings and aspirates are unacceptable for Xpert Xpress SARS-CoV-2/FLU/RSV testing.  Fact Sheet for Patients: BloggerCourse.com  Fact Sheet for Healthcare Providers: SeriousBroker.it  This test is not yet approved or cleared by the [place] FDA and has been authorized for detection and/or diagnosis of SARS-CoV-2 by FDA under an Emergency Use Authorization (EUA). This EUA will remain in effect  (meaning this test can be used) for the duration of the COVID-19 declaration under Section 564(b)(1) of the Act, 21 U.S.C. section 360bbb-3(b)(1), unless the authorization is terminated or revoked.  Performed at [facility], [address] [place], [place] [address]   Culture, blood (routine x 2)     Status: None (Preliminary result)   Collection Time: 11/03/22 11:11 AM   Specimen: BLOOD  Result Value Ref Range Status   Specimen Description BLOOD RIGHT FA  Final   Special Requests   Final    BOTTLES DRAWN AEROBIC AND ANAEROBIC Blood Culture results may not be optimal due to an inadequate volume of blood received in culture bottles   Culture   Final    NO GROWTH 4 DAYS Performed at [facility], [address], [place], [place] [address]    Report Status PENDING  Incomplete  Culture, blood (routine x 2)     Status: None (Preliminary result)   Collection Time: 11/03/22 11:11 AM   Specimen: BLOOD  Result Value Ref Range Status   Specimen Description BLOOD LEFT HAND  Final   Special Requests   Final    BOTTLES DRAWN AEROBIC AND ANAEROBIC Blood Culture results may not be optimal due to an inadequate volume of blood received in culture bottles   Culture   Final    NO GROWTH 4 DAYS Performed at [facility], [address] [place], [place] [address]    Report Status PENDING  Incomplete         Radiology Studies: No results found.      Scheduled Meds:  Chlorhexidine Gluconate Cloth  6 each Topical Daily   cyclobenzaprine  5 mg Oral QHS   enoxaparin (LOVENOX) injection  0.5 mg/kg Subcutaneous Q24H   furosemide  40 mg Oral Daily   pantoprazole  40 mg Oral Daily   sodium chloride flush  3 mL Intravenous Q12H   terbinafine  250 mg Oral Daily   Continuous Infusions:  sodium chloride       LOS: 4 days     Silvano Bilis, MD [facility]   If 7PM-7AM, please contact night-coverage [email or web address] Password [facility] 11/07/2022, 12:58  PM

## 2022-11-08 DIAGNOSIS — J9620 Acute and chronic respiratory failure, unspecified whether with hypoxia or hypercapnia: Secondary | ICD-10-CM | POA: Diagnosis not present

## 2022-11-08 LAB — CULTURE, BLOOD (ROUTINE X 2)
Culture: NO GROWTH
Culture: NO GROWTH

## 2022-11-08 LAB — BASIC METABOLIC PANEL
Anion gap: 10 (ref 5–15)
BUN: 26 mg/dL — ABNORMAL HIGH (ref 6–20)
CO2: 31 mmol/L (ref 22–32)
Calcium: 9 mg/dL (ref 8.9–10.3)
Chloride: 95 mmol/L — ABNORMAL LOW (ref 98–111)
Creatinine, Ser: 1.27 mg/dL — ABNORMAL HIGH (ref 0.44–1.00)
GFR, Estimated: 53 mL/min — ABNORMAL LOW (ref >60)
Glucose, Bld: 116 mg/dL — ABNORMAL HIGH (ref 70–99)
Potassium: 3.9 mmol/L (ref 3.5–5.1)
Sodium: 136 mmol/L (ref 135–145)

## 2022-11-08 NOTE — Progress Notes (Signed)
PROGRESS NOTE    Sandra Holloway  INO:[id number] DOB: 21-Jul-1976 DOA: 11/03/2022 PCP: Dairl Ponder, MD      Brief Narrative:   46 y.o. 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    Assessment & Plan:   Principal Problem:   Acute and chr resp failure, unsp w hypoxia or hypercapnia (HCC) Active Problems:   Morbid obesity with BMI of 60.0-69.9, adult (HCC)   Acute on chronic respiratory failure with hypoxia (HCC)   H/O tracheostomy   OSA (obstructive sleep apnea)   #Acute on Chronic Hypoxic & Hypercapnic Respiratory Failure in setting of Pulmonary Edema due to noncompliance with Lasix vs ? 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 Home health agency to perform overnight trial tonight, can d/c tomorrow if all goes well.     Acute Decompensated HFpEF due to noncompliance with Lasix Hypertension Echocardiogram 10/18/2022: LVEF 55 to 60%, unable to evaluate diastolic function RV function norm  - home lasix resumed   CKD 3a Stable - monitor   Morbid obesity Noted  Intertrigo Possibly candidal - started terbinafine 12/8   DVT prophylaxis: lovenox Code Status: full Family Communication: none @ bedside  Level of care: Stepdown Status is: Inpatient Remains inpatient appropriate because: need to perform inpatient NIV trial prior to d/c    Consultants:  pccm  Procedures: none  Antimicrobials:  none    Subjective: Breathing stable, no complaints  Objective: Vitals:   11/08/22 0800 11/08/22 0900 11/08/22 1000 11/08/22 1100  BP:      Pulse:  70 69 69 79  Resp: 17 17 (!) 22 17  Temp:      TempSrc:      SpO2: 96% 96% 96% 97%  Weight:      Height:        Intake/Output Summary (Last 24 hours) at 11/08/2022 1353 Last data filed at 11/07/2022 2300 Gross per 24 hour  Intake 253 ml  Output 900 ml  Net -647 ml   Filed Weights   11/05/22 0444 11/06/22 0500 11/07/22 0345  Weight: (!) 177.1 kg (!) 181.7 kg (!) 181.8 kg    Examination:  General exam: Appears calm and comfortable  Respiratory system: rales at bases, trach in place Cardiovascular system: S1 & S2 heard, RRR. No JVD, murmurs, rubs, gallops or clicks.   Gastrointestinal system: Abdomen is obese, soft and nontender. No organomegaly or masses felt. Normal bowel sounds heard. Central nervous system: Alert and oriented. No focal neurological deficits. Extremities: Symmetric 5 x 5 power. Trace LE edema Skin: erythematous rash under breasts Psychiatry: Judgement and insight appear normal. Mood & affect appropriate.     Data Reviewed: I have personally reviewed following labs and imaging studies  CBC: Recent Labs  Lab 11/03/22 1111 11/04/22 0442 11/05/22 0325  WBC 9.1 6.5 7.1  NEUTROABS 6.9  --   --   HGB 10.6* 10.0* 11.1*  HCT 34.0* 31.8* 34.8*  MCV 89.5 86.9 86.8  PLT 189 197 204   Basic Metabolic Panel: Recent Labs  Lab 11/03/22 1111 11/04/22 0442 11/05/22 0325 11/08/22 0317  NA 138 138 136 136  K 4.7 3.7 3.9 3.9  CL  103 99 96* 95*  CO2 27 31 31 31   GLUCOSE 97 104* 119* 116*  BUN 19 20 23* 26*  CREATININE 1.19* 1.23* 1.41* 1.27*  CALCIUM 8.7* 8.9 9.1 9.0  MG  --  1.8 2.1  --   PHOS  --  4.9* 5.1*  --    GFR: Estimated Creatinine Clearance: 89.8 mL/min (A) (by C-G formula based on SCr of 1.27 mg/dL (H)). Liver Function Tests: Recent Labs  Lab 11/03/22 1111  AST 18  ALT 16  ALKPHOS 51  BILITOT 0.7  PROT 7.0  ALBUMIN 3.5   No results for input(s): "LIPASE", "AMYLASE" in the last 168 hours. No results for input(s): "AMMONIA" in the  last 168 hours. Coagulation Profile: No results for input(s): "INR", "PROTIME" in the last 168 hours. Cardiac Enzymes: No results for input(s): "CKTOTAL", "CKMB", "CKMBINDEX", "TROPONINI" in the last 168 hours. BNP (last 3 results) No results for input(s): "PROBNP" in the last 8760 hours. HbA1C: No results for input(s): "HGBA1C" in the last 72 hours. CBG: No results for input(s): "GLUCAP" in the last 168 hours. Lipid Profile: No results for input(s): "CHOL", "HDL", "LDLCALC", "TRIG", "CHOLHDL", "LDLDIRECT" in the last 72 hours. Thyroid Function Tests: No results for input(s): "TSH", "T4TOTAL", "FREET4", "T3FREE", "THYROIDAB" in the last 72 hours. Anemia Panel: No results for input(s): "VITAMINB12", "FOLATE", "FERRITIN", "TIBC", "IRON", "RETICCTPCT" in the last 72 hours. Urine analysis:    Component Value Date/Time   COLORURINE STRAW (A) 10/17/2022 1059   APPEARANCEUR CLEAR (A) 10/17/2022 1059   LABSPEC 1.011 10/17/2022 1059   PHURINE 5.0 10/17/2022 1059   GLUCOSEU NEGATIVE 10/17/2022 1059   HGBUR NEGATIVE 10/17/2022 1059   BILIRUBINUR NEGATIVE 10/17/2022 1059   KETONESUR NEGATIVE 10/17/2022 1059   PROTEINUR 100 (A) 10/17/2022 1059   NITRITE NEGATIVE 10/17/2022 1059   LEUKOCYTESUR NEGATIVE 10/17/2022 1059   Sepsis Labs: @LABRCNTIP (procalcitonin:4,lacticidven:4)  ) Recent Results (from the past 240 hour(s))  Resp Panel by RT-PCR (Flu A&B, Covid) Anterior Nasal Swab     Status: None   Collection Time: 11/03/22 11:11 AM   Specimen: Anterior Nasal Swab  Result Value Ref Range Status   SARS Coronavirus 2 by RT PCR NEGATIVE NEGATIVE Final    Comment: (NOTE) SARS-CoV-2 target nucleic acids are NOT DETECTED.  The SARS-CoV-2 RNA is generally detectable in upper respiratory specimens during the acute phase of infection. The lowest concentration of SARS-CoV-2 viral copies this assay can detect is 138 copies/mL. A negative result does not preclude SARS-Cov-2 infection and should  not be used as the sole basis for treatment or other patient management decisions. A negative result may occur with  improper specimen collection/handling, submission of specimen other than nasopharyngeal swab, presence of viral mutation(s) within the areas targeted by this assay, and inadequate number of viral copies(<138 copies/mL). A negative result must be combined with clinical observations, patient history, and epidemiological information. The expected result is Negative.  Fact Sheet for Patients:   Fact Sheet for Healthcare Providers:  14/06/23  This test is no t yet approved or cleared by the [employer] FDA and  has been authorized for detection and/or diagnosis of SARS-CoV-2 by FDA under an Emergency Use Authorization (EUA). This EUA will remain  in effect (meaning this test can be used) for the duration of the COVID-19 declaration under Section 564(b)(1) of the Act, 21 U.S.C.section 360bbb-3(b)(1), unless the authorization is terminated  or revoked sooner.       Influenza A by PCR NEGATIVE NEGATIVE Final  Influenza B by PCR NEGATIVE NEGATIVE Final    Comment: (NOTE) The Xpert Xpress SARS-CoV-2/FLU/RSV plus assay is intended as an aid in the diagnosis of influenza from Nasopharyngeal swab specimens and should not be used as a sole basis for treatment. Nasal washings and aspirates are unacceptable for Xpert Xpress SARS-CoV-2/FLU/RSV testing.  Fact Sheet for Patients: BloggerCourse.com  Fact Sheet for Healthcare Providers: SeriousBroker.it  This test is not yet approved or cleared by the [place] FDA and has been authorized for detection and/or diagnosis of SARS-CoV-2 by FDA under an Emergency Use Authorization (EUA). This EUA will remain in effect (meaning this test can be used) for the duration of the COVID-19 declaration under  Section 564(b)(1) of the Act, 21 U.S.C. section 360bbb-3(b)(1), unless the authorization is terminated or revoked.  Performed at [facility], [address] [place], [place] [address]   Culture, blood (routine x 2)     Status: None   Collection Time: 11/03/22 11:11 AM   Specimen: BLOOD  Result Value Ref Range Status   Specimen Description BLOOD RIGHT FA  Final   Special Requests   Final    BOTTLES DRAWN AEROBIC AND ANAEROBIC Blood Culture results may not be optimal due to an inadequate volume of blood received in culture bottles   Culture   Final    NO GROWTH 5 DAYS Performed at [facility], [address] [place], [place] [address]    Report Status 11/08/2022 FINAL  Final  Culture, blood (routine x 2)     Status: None   Collection Time: 11/03/22 11:11 AM   Specimen: BLOOD  Result Value Ref Range Status   Specimen Description BLOOD LEFT HAND  Final   Special Requests   Final    BOTTLES DRAWN AEROBIC AND ANAEROBIC Blood Culture results may not be optimal due to an inadequate volume of blood received in culture bottles   Culture   Final    NO GROWTH 5 DAYS Performed at [facility], [address], [place], [place] [address]    Report Status 11/08/2022 FINAL  Final         Radiology Studies: No results found.      Scheduled Meds:  Chlorhexidine Gluconate Cloth  6 each Topical Daily   cyclobenzaprine  5 mg Oral QHS   enoxaparin (LOVENOX) injection  0.5 mg/kg Subcutaneous Q24H   furosemide  40 mg Oral Daily   pantoprazole  40 mg Oral Daily   sodium chloride flush  3 mL Intravenous Q12H   terbinafine  250 mg Oral Daily   Continuous Infusions:  sodium chloride       LOS: 5 days     Silvano Bilis, MD [facility]   If 7PM-7AM, please contact night-coverage [email or web address] Password [email or web address] 11/08/2022, 1:53 PM

## 2022-11-08 NOTE — TOC Progression Note (Signed)
Transition of Care [facility]) - Progression Note    Patient Details  Name: Sandra Holloway MRN: [id number] Date of Birth: 10-Jul-1976  [facility] CM/SW Contact  Allayne Butcher, RN Phone Number: 11/08/2022, 2:01 PM  Clinical Narrative:    Patient is on schedule for trilogy overnight trial in the hospital tonight with her fiance.  As long as the vent trial goes well tonight patient will be able to discharge tomorrow.    Expected Discharge Plan: Home/Self Care    Expected Discharge Plan and Services Expected Discharge Plan: Home/Self Care                         DME Arranged: NIV, [facility] DME Agency: [employer] Date DME Agency Contacted: 11/04/22 Time DME Agency Contacted: [phone] Representative spoke with at DME Agency: Elie Goody             Social Determinants of Health (SDOH) Interventions    Readmission Risk Interventions     No data to display

## 2022-11-09 DIAGNOSIS — J9620 Acute and chronic respiratory failure, unspecified whether with hypoxia or hypercapnia: Secondary | ICD-10-CM | POA: Diagnosis not present

## 2022-11-09 LAB — BASIC METABOLIC PANEL
Anion gap: 9 (ref 5–15)
BUN: 28 mg/dL — ABNORMAL HIGH (ref 6–20)
CO2: 33 mmol/L — ABNORMAL HIGH (ref 22–32)
Calcium: 9.4 mg/dL (ref 8.9–10.3)
Chloride: 94 mmol/L — ABNORMAL LOW (ref 98–111)
Creatinine, Ser: 1.5 mg/dL — ABNORMAL HIGH (ref 0.44–1.00)
GFR, Estimated: 43 mL/min — ABNORMAL LOW (ref >60)
Glucose, Bld: 107 mg/dL — ABNORMAL HIGH (ref 70–99)
Potassium: 4.2 mmol/L (ref 3.5–5.1)
Sodium: 136 mmol/L (ref 135–145)

## 2022-11-09 MED ORDER — TRIAMCINOLONE ACETONIDE 0.1 % EX CREA: 1.0000 | TOPICAL_CREAM | Freq: Two times a day (BID) | CUTANEOUS | 0 refills | Status: DC

## 2022-11-09 MED ORDER — TERBINAFINE HCL 250 MG PO TABS: 250.0000 mg | ORAL_TABLET | Freq: Every day | ORAL | 0 refills | Status: DC

## 2022-11-09 MED ORDER — HYDROXYZINE HCL 50 MG PO TABS: 50.0000 mg | ORAL_TABLET | Freq: Every evening | ORAL | 0 refills | Status: DC | PRN

## 2022-11-09 NOTE — Discharge Summary (Signed)
Sandra Holloway IRC:[id number] DOB: 10/10/1976 DOA: 11/03/2022  PCP: Dairl Ponder, MD  Admit date: 11/03/2022 Discharge date: 11/09/2022  Time spent: 35 minutes  Recommendations for Outpatient Follow-up:  Pcp f/u     Discharge Diagnoses:  Principal Problem:   Acute and chr resp failure, unsp w hypoxia or hypercapnia (HCC) Active Problems:   Morbid obesity with BMI of 60.0-69.9, adult (HCC)   Acute on chronic respiratory failure with hypoxia (HCC)   H/O tracheostomy   OSA (obstructive sleep apnea)   Discharge Condition: stable  Diet recommendation: heart healthy  Filed Weights   11/05/22 0444 11/06/22 0500 11/07/22 0345  Weight: (!) 177.1 kg (!) 181.7 kg (!) 181.8 kg    History of present illness:  From admission h and p  Sandra Holloway is a 46 year old female with a past medical history significant for morbid obesity, obesity hypoventilation syndrome, obstructive sleep apnea, 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   Upon EMS arrival she was noted to be hypoxic with O2 saturations in the low 80s on 6 L nasal cannula, along with have thick secretions.  EMS was able to suction the secretions with some improvement in her respiratory status, and She did require uptitration to 10 L of supplemental oxygen   She denies chest pain, abdominal pain, nausea, vomiting, diarrhea, dysuria, fever, chills.  She does report she has not been compliant with her home Lasix as she does not like how it makes her have to urinate frequently.   Of note she was recently admitted at [facility] from 10/16/2022 through 10/21/2022 for acute hypoxic respiratory failure due to pneumonia and mucous plugging.  She was discharged home on a course of Augmentin, which she reports she completed and was compliant with.  Hospital Course:   Patient  presented with acute hypoxic/hypercarbic respiratory failure 2/2 noncompliance w/ home lasix in the setting of morbid obesity, OSA, and OHS. She has a chronic trach. She was ventilated briefly and promptly weaned off. Respiratory status stabilized on home lasix. She was evaluated by home health agency for trilogy NIV and underwent successful trial with the machine on 12/11. She will discharge with that machine. Also treated for intertrigo possibly candidal with course terbinafine and topical steroids.   Procedures: none   Consultations: pccm  Discharge Exam: Vitals:   11/09/22 0700 11/09/22 0800  BP:  (!) 142/68  Pulse: 69 66  Resp: 13 16  Temp:  (!) 97.1 F (36.2 C)  SpO2: 100% 99%    General exam: Appears calm and comfortable  Respiratory system: rales at bases, trach in place Cardiovascular system: S1 & S2 heard, RRR. No JVD, murmurs, rubs, gallops or clicks.   Gastrointestinal system: Abdomen is obese, soft and nontender. No organomegaly or masses felt. Normal bowel sounds heard. Central nervous system: Alert and oriented. No focal neurological deficits. Extremities: Symmetric 5 x 5 power. Trace LE edema Skin: erythematous rash under breasts Psychiatry: Judgement and insight appear normal. Mood & affect appropriate.   Discharge Instructions   Discharge Instructions     Diet - low sodium heart healthy   Complete by: As directed    Increase activity slowly   Complete by: As directed       Allergies as of 11/09/2022   No Known Allergies      Medication List     TAKE these medications  acetaminophen 325 MG tablet Commonly known as: TYLENOL Take 2 tablets (650 mg total) by mouth every 6 (six) hours as needed for mild pain (or Fever >/= 101).   albuterol 108 (90 Base) MCG/ACT inhaler Commonly known as: VENTOLIN HFA Inhale 2 puffs into the lungs every 6 (six) hours as needed for wheezing or shortness of breath.   cyclobenzaprine 5 MG tablet Commonly known as:  FLEXERIL Take 5 mg by mouth at bedtime.   furosemide 40 MG tablet Commonly known as: LASIX Take 40 mg by mouth daily.   hydrOXYzine 50 MG tablet Commonly known as: ATARAX Take 1 tablet (50 mg total) by mouth at bedtime as needed for anxiety (prior to bipap).   ipratropium-albuterol 0.5-2.5 (3) MG/3ML Soln Commonly known as: DUONEB Take 3 mLs by nebulization every 6 (six) hours as needed.   loratadine 10 MG tablet Commonly known as: CLARITIN Take 10 mg by mouth daily.   omeprazole 20 MG capsule Commonly known as: PRILOSEC Take 20 mg by mouth daily.   oxyCODONE-acetaminophen 7.5-325 MG tablet Commonly known as: PERCOCET Take 1 tablet by mouth every 4 (four) hours as needed for moderate pain or severe pain.   terbinafine 250 MG tablet Commonly known as: LAMISIL Take 1 tablet (250 mg total) by mouth daily. Start taking on: November 10, 2022   triamcinolone cream 0.1 % Commonly known as: KENALOG Apply 1 Application topically 2 (two) times daily.               Durable Medical Equipment  (From admission, onward)           Start     Ordered   11/08/22 1523  For home use only DME Suction  Once       Comments: Suction cath size 14 Fr  Question:  Suction  Answer:  Janina Mayo   11/08/22 1522   11/04/22 1624  For home use only DME Suction  Once       Question:  Suction  Answer:  Trach   11/04/22 1623           No Known Allergies  Follow-up Information     Ray, Pincus Large, MD Follow up.   Specialty: Internal Medicine Contact information: [address] [facility]) [place] [address] [phone]                  The results of significant diagnostics from this hospitalization (including imaging, microbiology, ancillary and laboratory) are listed below for reference.    Significant Diagnostic Studies: DG Chest Port 1 View  Result Date: 11/03/2022 CLINICAL DATA:  Shortness of breath EXAM: PORTABLE CHEST 1 VIEW  COMPARISON:  Chest x-ray dated October 16, 2022 FINDINGS: Cardiac and mediastinal contours are unchanged. Tracheostomy tube in place. Diffuse airspace opacities are unchanged when compared with the prior exam possible new trace bilateral pleural effusions, patient body habitus somewhat limits evaluation. No evidence of pneumothorax. IMPRESSION: 1. Diffuse airspace opacities are unchanged when compared with the prior exam, likely due to pulmonary edema. 2. Possible new trace bilateral pleural effusions. Electronically Signed   By: Allegra Lai M.D.   On: 11/03/2022 12:44   ECHOCARDIOGRAM COMPLETE  Result Date: 10/18/2022    ECHOCARDIOGRAM REPORT   Patient Name:   HAVAH AMMON Date of Exam: 10/18/2022 Medical Rec #:  [id number]      Height:       62.0 in Accession #:    [id number]     Weight:  407.8 lb Date of Birth:  1976/10/09      BSA:          2.588 m Patient Age:    46 years       BP:           Not listed in chart/Not listed in                                              chart mmHg Patient Gender: F              HR:           Not listed in chart bpm. Exam Location:  [facility] Procedure: 2D Echo, Cardiac Doppler and Color Doppler Indications:     CHF-acute diastolic I50.31  History:         Patient has no prior history of Echocardiogram examinations.                  Risk Factors:Hypertension.  Sonographer:     Cristela Blue Referring Phys:  [id number] HARSH CHAWLA Diagnosing Phys: Lorine Bears MD  Sonographer Comments: Technically difficult study due to poor echo windows, no apical window and no subcostal window. The only view obtainable was parasternal. IMPRESSIONS  1. Left ventricular ejection fraction, by estimation, is 55 to 60%. The left ventricle has normal function. Left ventricular endocardial border not optimally defined to evaluate regional wall motion. Left ventricular diastolic function could not be evaluated.  2. Right ventricular systolic function is normal. The right ventricular size is  normal. Tricuspid regurgitation signal is inadequate for assessing PA pressure.  3. The mitral valve is normal in structure. No evidence of mitral valve regurgitation. No evidence of mitral stenosis.  4. The aortic valve is normal in structure. Aortic valve regurgitation is not visualized. No aortic stenosis is present.  5. Technically difficult study due to poor echo windows, no apical window and no subcostal window. The only view obtainable was parasternal. FINDINGS  Left Ventricle: Left ventricular ejection fraction, by estimation, is 55 to 60%. The left ventricle has normal function. Left ventricular endocardial border not optimally defined to evaluate regional wall motion. The left ventricular internal cavity size was normal in size. There is no left ventricular hypertrophy. Left ventricular diastolic function could not be evaluated. Right Ventricle: The right ventricular size is normal. No increase in right ventricular wall thickness. Right ventricular systolic function is normal. Tricuspid regurgitation signal is inadequate for assessing PA pressure. Left Atrium: Left atrial size was normal in size. Right Atrium: Right atrial size was normal in size. Pericardium: There is no evidence of pericardial effusion. Mitral Valve: The mitral valve is normal in structure. No evidence of mitral valve regurgitation. No evidence of mitral valve stenosis. Tricuspid Valve: The tricuspid valve is normal in structure. Tricuspid valve regurgitation is not demonstrated. No evidence of tricuspid stenosis. Aortic Valve: The aortic valve is normal in structure. Aortic valve regurgitation is not visualized. No aortic stenosis is present. Pulmonic Valve: The pulmonic valve was normal in structure. Pulmonic valve regurgitation is not visualized. No evidence of pulmonic stenosis. Aorta: The aortic root is normal in size and structure. Venous: The inferior vena cava was not well visualized. IAS/Shunts: No atrial level shunt detected by  color flow Doppler.  LEFT VENTRICLE PLAX 2D LVIDd:         4.90 cm LVIDs:  3.10 cm LV PW:         1.60 cm LV IVS:        1.00 cm LVOT diam:     2.00 cm LVOT Area:     3.14 cm  LEFT ATRIUM         Index LA diam:    4.10 cm 1.58 cm/m   AORTA Ao Root diam: 3.20 cm  SHUNTS Systemic Diam: 2.00 cm Lorine Bears MD Electronically signed by Lorine Bears MD Signature Date/Time: 10/18/2022/11:10:22 AM    Final    DG Chest Port 1 View  Result Date: 10/16/2022 CLINICAL DATA:  Questionable sepsis - evaluate for abnormality EXAM: PORTABLE CHEST 1 VIEW COMPARISON:  Radiograph head CT 05/31/2022 FINDINGS: Tracheostomy tube tip at the thoracic inlet. Stable cardiomegaly peer again seen vascular congestion. There is superimposed ill-defined bilateral perihilar opacities. No large pleural effusion. No pneumothorax. Detailed assessment is limited by portable technique and soft tissue attenuation from habitus. IMPRESSION: 1. Ill-defined bilateral perihilar opacities, may represent pulmonary edema or infection. 2. Stable cardiomegaly and vascular congestion. 3. Tracheostomy tube tip at the thoracic inlet. Electronically Signed   By: Narda Rutherford M.D.   On: 10/16/2022 23:22    Microbiology: Recent Results (from the past 240 hour(s))  Resp Panel by RT-PCR (Flu A&B, Covid) Anterior Nasal Swab     Status: None   Collection Time: 11/03/22 11:11 AM   Specimen: Anterior Nasal Swab  Result Value Ref Range Status   SARS Coronavirus 2 by RT PCR NEGATIVE NEGATIVE Final    Comment: (NOTE) SARS-CoV-2 target nucleic acids are NOT DETECTED.  The SARS-CoV-2 RNA is generally detectable in upper respiratory specimens during the acute phase of infection. The lowest concentration of SARS-CoV-2 viral copies this assay can detect is 138 copies/mL. A negative result does not preclude SARS-Cov-2 infection and should not be used as the sole basis for treatment or other patient management decisions. A negative result may  occur with  improper specimen collection/handling, submission of specimen other than nasopharyngeal swab, presence of viral mutation(s) within the areas targeted by this assay, and inadequate number of viral copies(<138 copies/mL). A negative result must be combined with clinical observations, patient history, and epidemiological information. The expected result is Negative.  Fact Sheet for Patients:  BloggerCourse.com  Fact Sheet for Healthcare Providers:  SeriousBroker.it  This test is no t yet approved or cleared by the [place] FDA and  has been authorized for detection and/or diagnosis of SARS-CoV-2 by FDA under an Emergency Use Authorization (EUA). This EUA will remain  in effect (meaning this test can be used) for the duration of the COVID-19 declaration under Section 564(b)(1) of the Act, 21 U.S.C.section 360bbb-3(b)(1), unless the authorization is terminated  or revoked sooner.       Influenza A by PCR NEGATIVE NEGATIVE Final   Influenza B by PCR NEGATIVE NEGATIVE Final    Comment: (NOTE) The Xpert Xpress SARS-CoV-2/FLU/RSV plus assay is intended as an aid in the diagnosis of influenza from Nasopharyngeal swab specimens and should not be used as a sole basis for treatment. Nasal washings and aspirates are unacceptable for Xpert Xpress SARS-CoV-2/FLU/RSV testing.  Fact Sheet for Patients: BloggerCourse.com  Fact Sheet for Healthcare Providers: SeriousBroker.it  This test is not yet approved or cleared by the [place] FDA and has been authorized for detection and/or diagnosis of SARS-CoV-2 by FDA under an Emergency Use Authorization (EUA). This EUA will remain in effect (meaning this test can be used) for  the duration of the COVID-19 declaration under Section 564(b)(1) of the Act, 21 U.S.C. section 360bbb-3(b)(1), unless the authorization is terminated  or revoked.  Performed at [facility], [address] [place], [place] [address]   Culture, blood (routine x 2)     Status: None   Collection Time: 11/03/22 11:11 AM   Specimen: BLOOD  Result Value Ref Range Status   Specimen Description BLOOD RIGHT FA  Final   Special Requests   Final    BOTTLES DRAWN AEROBIC AND ANAEROBIC Blood Culture results may not be optimal due to an inadequate volume of blood received in culture bottles   Culture   Final    NO GROWTH 5 DAYS Performed at [facility], [address] [place], [place] [address]    Report Status 11/08/2022 FINAL  Final  Culture, blood (routine x 2)     Status: None   Collection Time: 11/03/22 11:11 AM   Specimen: BLOOD  Result Value Ref Range Status   Specimen Description BLOOD LEFT HAND  Final   Special Requests   Final    BOTTLES DRAWN AEROBIC AND ANAEROBIC Blood Culture results may not be optimal due to an inadequate volume of blood received in culture bottles   Culture   Final    NO GROWTH 5 DAYS Performed at [facility], [address] [place], [place] [address]    Report Status 11/08/2022 FINAL  Final     Labs: Basic Metabolic Panel: Recent Labs  Lab 11/03/22 1111 11/04/22 0442 11/05/22 0325 11/08/22 0317 11/09/22 0449  NA 138 138 136 136 136  K 4.7 3.7 3.9 3.9 4.2  CL 103 99 96* 95* 94*  CO2 27 31 31 31  33*  GLUCOSE 97 104* 119* 116* 107*  BUN 19 20 23* 26* 28*  CREATININE 1.19* 1.23* 1.41* 1.27* 1.50*  CALCIUM 8.7* 8.9 9.1 9.0 9.4  MG  --  1.8 2.1  --   --   PHOS  --  4.9* 5.1*  --   --    Liver Function Tests: Recent Labs  Lab 11/03/22 1111  AST 18  ALT 16  ALKPHOS 51  BILITOT 0.7  PROT 7.0  ALBUMIN 3.5   No results for input(s): "LIPASE", "AMYLASE" in the last 168 hours. No results for input(s): "AMMONIA" in the last 168 hours. CBC: Recent Labs  Lab 11/03/22 1111 11/04/22 0442 11/05/22 0325  WBC 9.1 6.5 7.1  NEUTROABS 6.9  --   --   HGB 10.6*  10.0* 11.1*  HCT 34.0* 31.8* 34.8*  MCV 89.5 86.9 86.8  PLT 189 197 204   Cardiac Enzymes: No results for input(s): "CKTOTAL", "CKMB", "CKMBINDEX", "TROPONINI" in the last 168 hours. BNP: BNP (last 3 results) Recent Labs    05/31/22 0034 10/17/22 0652 11/03/22 1111  BNP 19.7 55.8 40.9    ProBNP (last 3 results) No results for input(s): "PROBNP" in the last 8760 hours.  CBG: No results for input(s): "GLUCAP" in the last 168 hours.     Signed:  Silvano BilisNoah B Berk Pilot MD.  [facility] 11/09/2022, 9:36 AM

## 2022-11-09 NOTE — TOC Transition Note (Signed)
Transition of Care [facility] - CM/SW Discharge Note   Patient Details  Name: Sandra Holloway MRN: [id number] Date of Birth: [birth year]  [facility] CM/SW Contact:  Allayne Butcher, RN Phone Number: 11/09/2022, 10:43 AM   Clinical Narrative:    Overnight invasive vent trial in the hospital went well last night and patient is discharging home.  Keat with Respiratory therapist with [employer] will call the patient when she gets home and finish setting everything up.     Final next level of care: Home/Self Care Barriers to Discharge: Barriers Resolved   Patient Goals and CMS Choice   CMS Medicare.gov Compare Post Acute Care list provided to:: Patient Choice offered to / list presented to : Patient  Discharge Placement                       Discharge Plan and Services                DME Arranged: NIV, Suction DME Agency: [employer] Date DME Agency Contacted: 11/09/22 Time DME Agency Contacted: 1043 Representative spoke with at DME Agency: Elie Goody            Social Determinants of Health (SDOH) Interventions     Readmission Risk Interventions     No data to display

## 2022-11-09 NOTE — Plan of Care (Signed)
  Problem: Education: Goal: Knowledge of General Education information will improve Description: Including pain rating scale, medication(s)/side effects and non-pharmacologic comfort measures Outcome: Adequate for Discharge   Problem: Health Behavior/Discharge Planning: Goal: Ability to manage health-related needs will improve Outcome: Adequate for Discharge   Problem: Clinical Measurements: Goal: Ability to maintain clinical measurements within normal limits will improve Outcome: Adequate for Discharge Goal: Will remain free from infection Outcome: Adequate for Discharge Goal: Diagnostic test results will improve Outcome: Adequate for Discharge Goal: Respiratory complications will improve Outcome: Adequate for Discharge Goal: Cardiovascular complication will be avoided Outcome: Adequate for Discharge   Problem: Activity: Goal: Risk for activity intolerance will decrease Outcome: Adequate for Discharge   Problem: Nutrition: Goal: Adequate nutrition will be maintained Outcome: Adequate for Discharge   Problem: Coping: Goal: Level of anxiety will decrease Outcome: Adequate for Discharge   Problem: Elimination: Goal: Will not experience complications related to bowel motility Outcome: Adequate for Discharge Goal: Will not experience complications related to urinary retention Outcome: Adequate for Discharge   Problem: Pain Managment: Goal: General experience of comfort will improve Outcome: Adequate for Discharge   Problem: Safety: Goal: Ability to remain free from injury will improve Outcome: Adequate for Discharge   Problem: Skin Integrity: Goal: Risk for impaired skin integrity will decrease Outcome: Adequate for Discharge Pt has mett all criteria for discharge.

## 2022-11-09 NOTE — Discharge Instructions (Signed)
Increase activity slowly.  Low Sodium Heart Healthy diet.

## 2023-02-09 ENCOUNTER — Other Ambulatory Visit: Payer: Self-pay | Admitting: Anesthesiology

## 2023-02-09 ENCOUNTER — Ambulatory Visit
Admission: RE | Admit: 2023-02-09 | Discharge: 2023-02-09 | Disposition: A | Discharge status: Home / Self Care | Payer: Medicaid Other | Attending: Anesthesiology | Admitting: Anesthesiology

## 2023-02-09 ENCOUNTER — Ambulatory Visit
Admission: RE | Admit: 2023-02-09 | Discharge: 2023-02-09 | Disposition: A | Discharge status: Home / Self Care | Payer: Medicaid Other | Source: Ambulatory Visit | Attending: Anesthesiology | Admitting: Anesthesiology

## 2023-02-09 DIAGNOSIS — M25561 Pain in right knee: Secondary | ICD-10-CM | POA: Insufficient documentation

## 2023-02-14 ENCOUNTER — Other Ambulatory Visit: Payer: Self-pay | Admitting: Obstetrics and Gynecology

## 2023-02-14 DIAGNOSIS — I5032 Chronic diastolic (congestive) heart failure: Secondary | ICD-10-CM | POA: Diagnosis present

## 2023-03-18 IMAGING — CT CT ABD-PELV W/ CM
2 of 5 series · 16 of 46 positions shown, 18 images · IV contrast (APPLIED)
Comparison: None

CLINICAL DATA: Sepsis, shortness of breath since yesterday,
abdominal pain, history of cellulitis

EXAM:
CT ABDOMEN AND PELVIS WITH CONTRAST
TECHNIQUE: Multidetector CT imaging of the abdomen and pelvis was performed
using the standard protocol following bolus administration of
intravenous contrast.

[Series 2: abdomen 5.0 · axial · 0.85mm/px · z∈[-998,-588]mm · 13 of 96 slices shown, 15 images]
[im 7/96  soft-tissue]
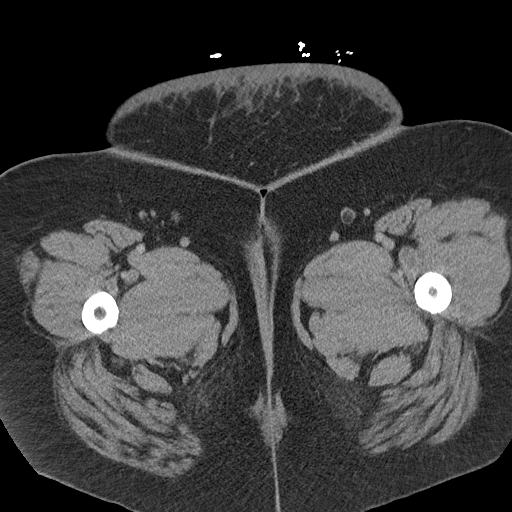
[im 7/96  bone]
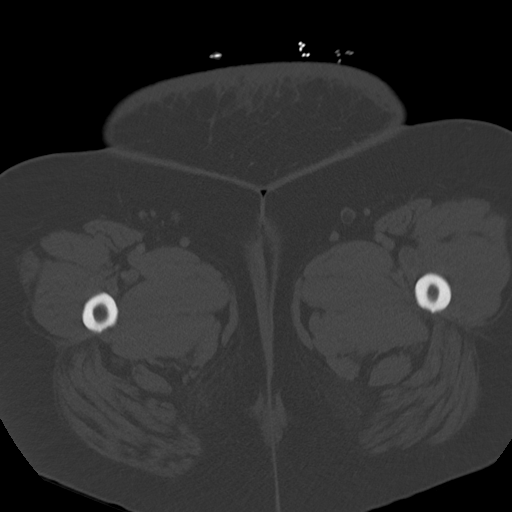
[im 13/96  soft-tissue]
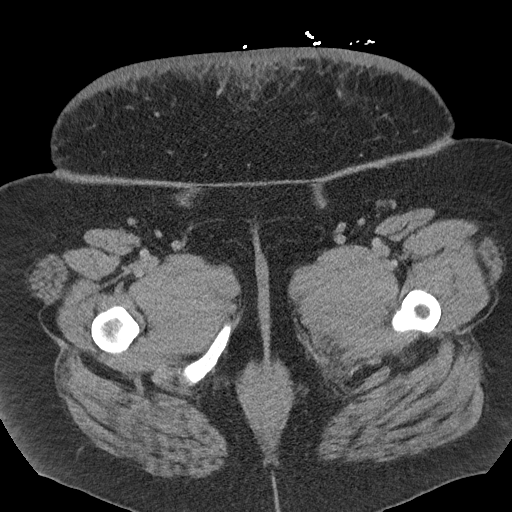
[im 20/96  soft-tissue]
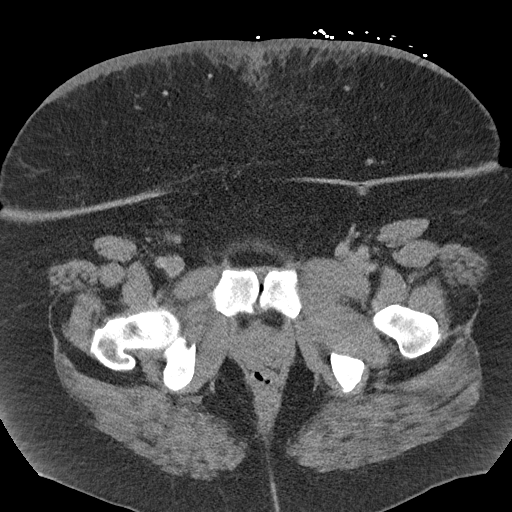
[im 26/96  soft-tissue]
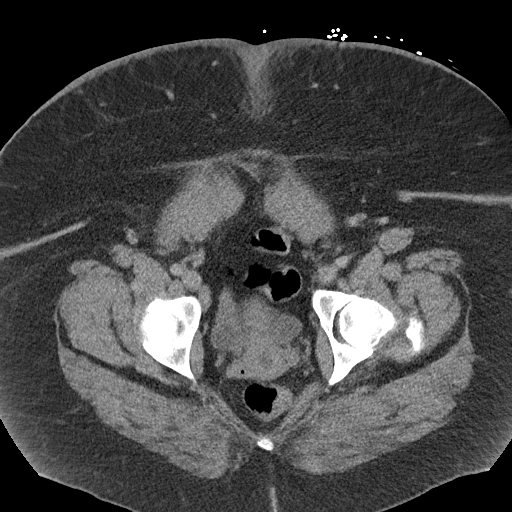
[im 32/96  soft-tissue]
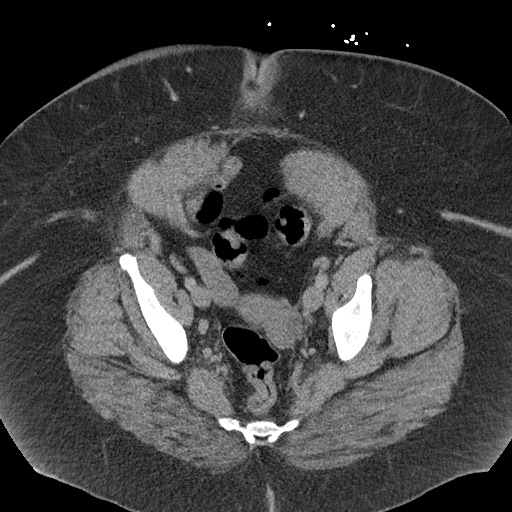
[im 39/96  soft-tissue]
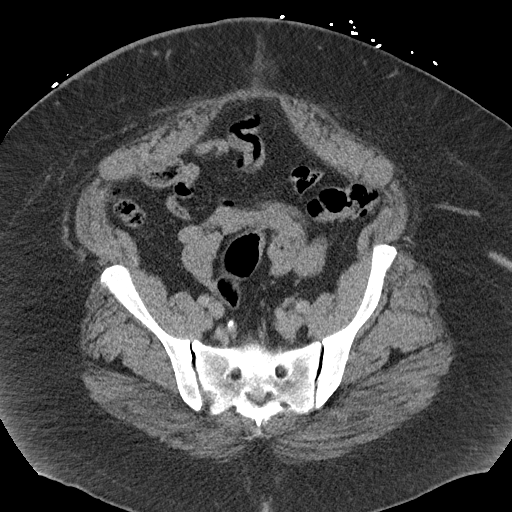
[im 51/96  soft-tissue]
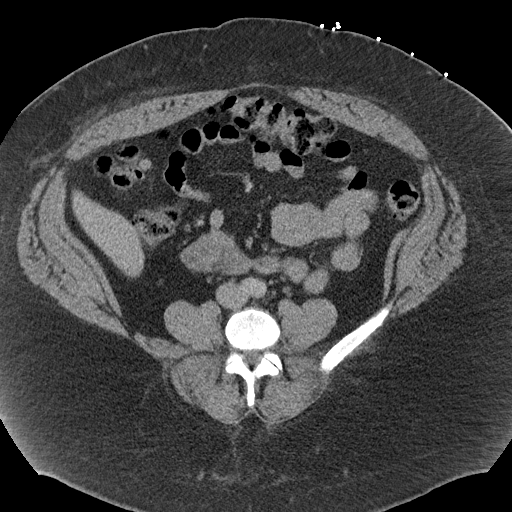
[im 58/96  soft-tissue]
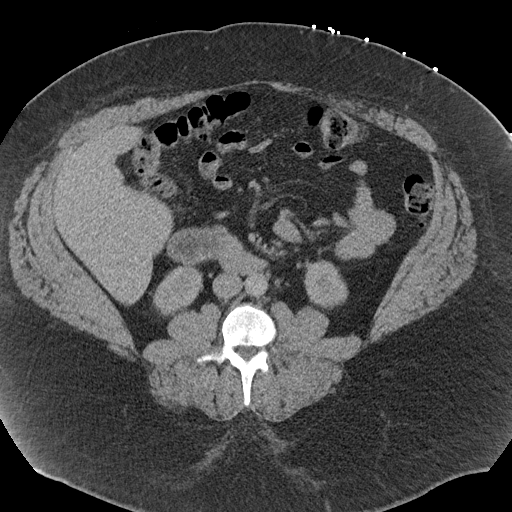
[im 64/96  soft-tissue]
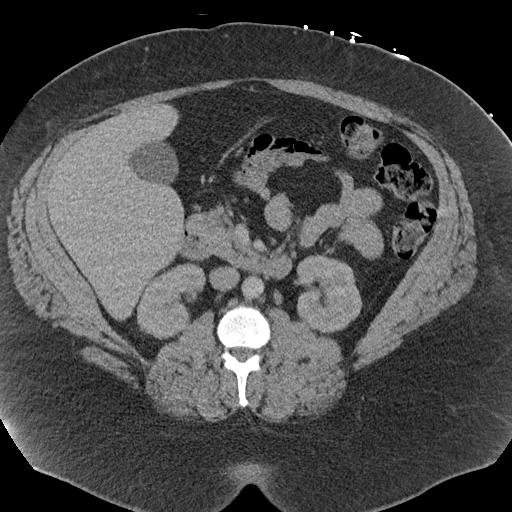
[im 64/96  bone]
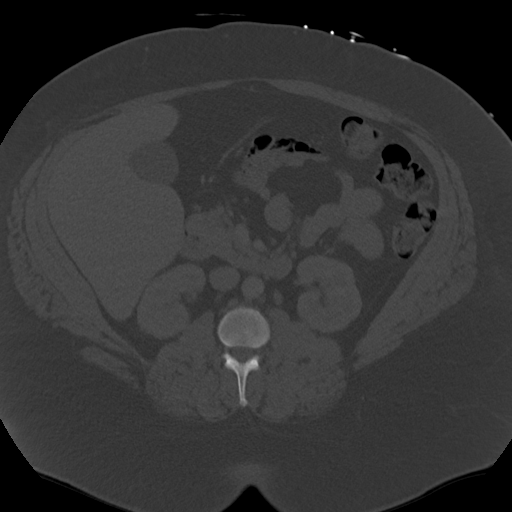
[im 70/96  soft-tissue]
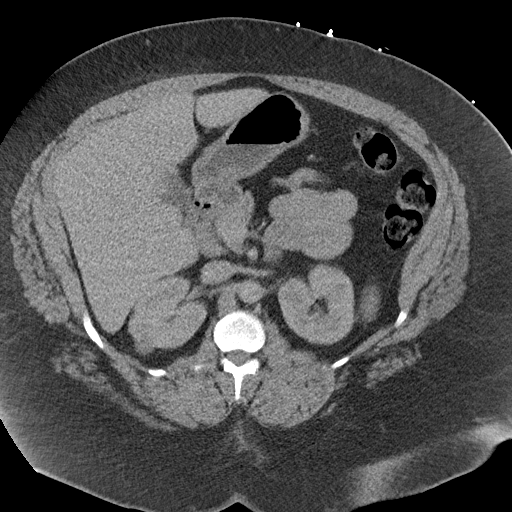
[im 77/96  soft-tissue]
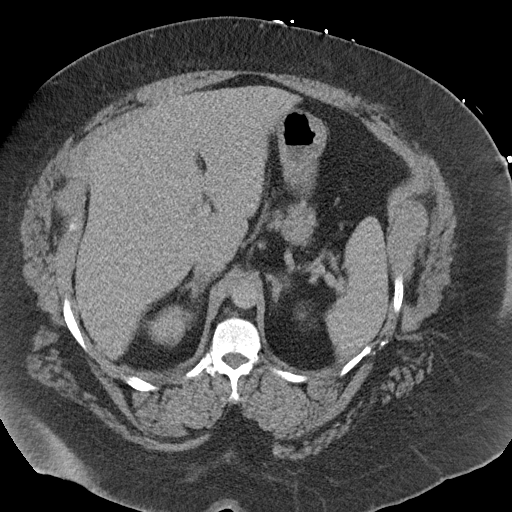
[im 83/96  soft-tissue]
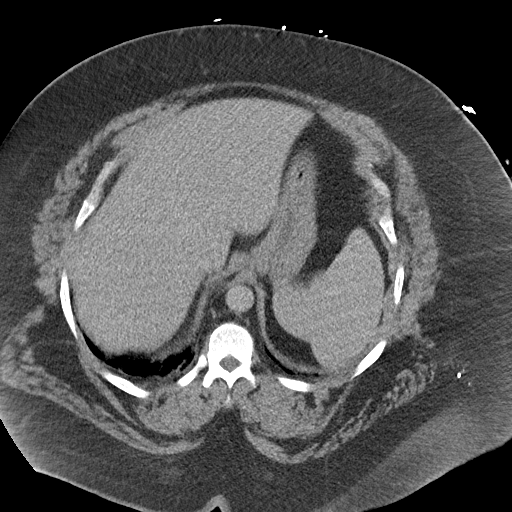
[im 89/96  soft-tissue]
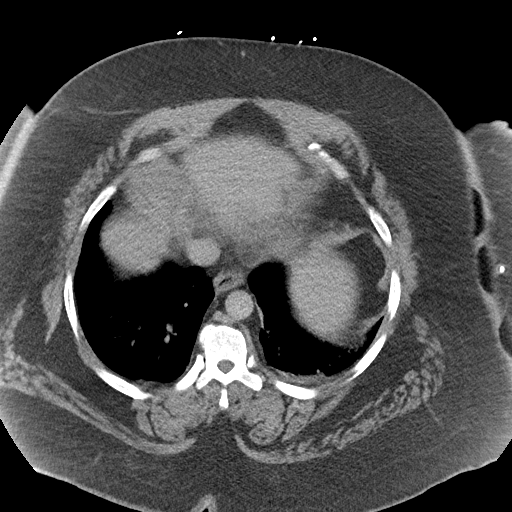

[Series 5: abdomen 3.0 mpr cor · coronal · 1.00mm/px · 3 of 148 slices shown]
[im 50/148  soft-tissue]
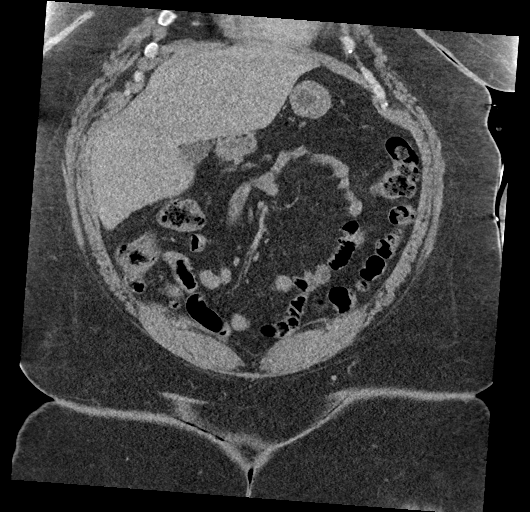
[im 66/148  soft-tissue]
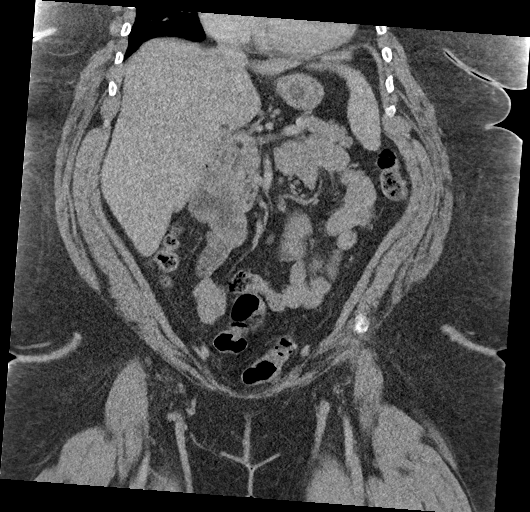
[im 82/148  soft-tissue]
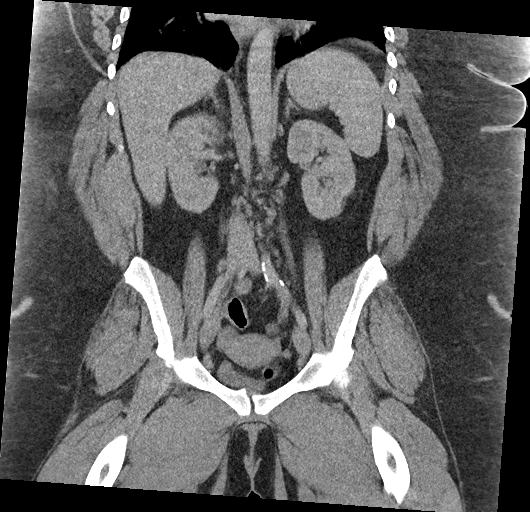

[16 of 46 positions shown; findings below may reference images not displayed]

RADIATION DOSE REDUCTION: This exam was performed according to the
departmental dose-optimization program which includes automated
exposure control, adjustment of the mA and/or kV according to
patient size and/or use of iterative reconstruction technique.

CONTRAST:  100mL OMNIPAQUE IOHEXOL 350 MG/ML SOLN IV. No oral
contrast.
FINDINGS: Lower chest: Bibasilar atelectasis

Hepatobiliary: Gallbladder and liver normal appearance

Pancreas: Normal appearance

Spleen: Normal appearance

Adrenals/Urinary Tract: Adrenal glands normal appearance. Tiny
nonobstructing calculus at inferior pole LEFT kidney. Exophytic
renal lesion 15 mm diameter, indeterminate attenuation. Kidneys,
ureters, and bladder otherwise normal appearance.

Stomach/Bowel: Normal appendix. Stomach and bowel loops normal
appearance.

Vascular/Lymphatic: Aorta normal caliber. Atherosclerotic
calcifications in iliac arteries. No adenopathy.

Reproductive: Unremarkable uterus and ovaries

Other: No free air or free fluid. No acute inflammatory process.
Small umbilical hernia containing fat.

Musculoskeletal: Osseous structures unremarkable. Skin thickening
and infiltrative changes in the subcutaneous fat at the inferior
abdominal wall pannus in the pelvis question cellulitis.
IMPRESSION: Skin thickening and infiltrative changes in the subcutaneous fat at
the inferior abdominal wall pannus in the pelvis question
cellulitis.

Small umbilical hernia containing fat.

Tiny nonobstructing LEFT renal calculus.

Exophytic 15 mm diameter renal lesion, indeterminate attenuation;
follow-up nonemergent renal ultrasound recommended to characterize.

## 2023-03-18 IMAGING — CT CT CHEST W/O CM
2 of 4 series · 15 of 36 positions shown, 18 images · non-contrast
Comparison: Chest radiograph 03/28/2022

CLINICAL DATA: Pneumonia, suspected complication



[Series 2: chest wo · axial · 0.74mm/px · z∈[-772,-488]mm · 12 of 168 slices shown, 15 images]
[im 13/168  mediastinal]
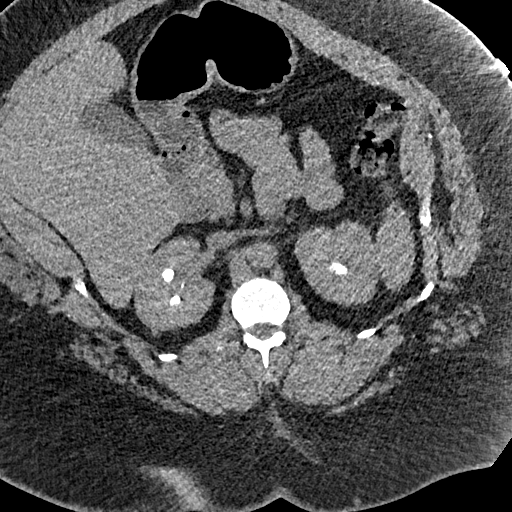
[im 13/168  lung]
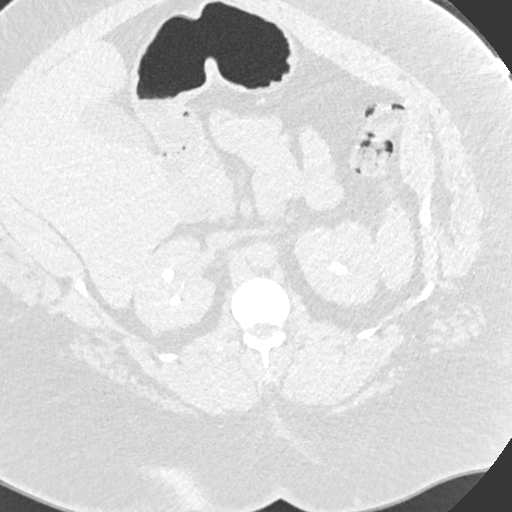
[im 26/168  lung]
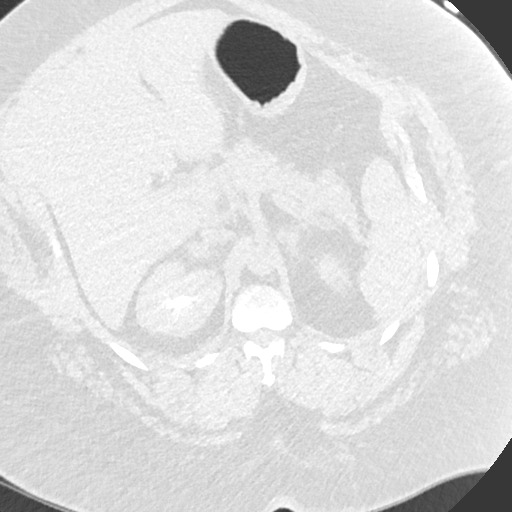
[im 39/168  lung]
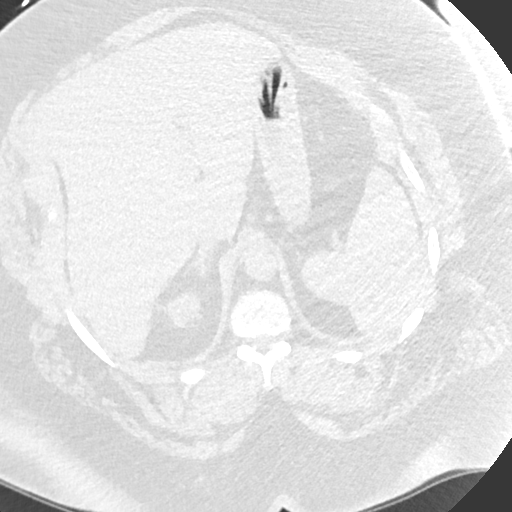
[im 52/168  lung]
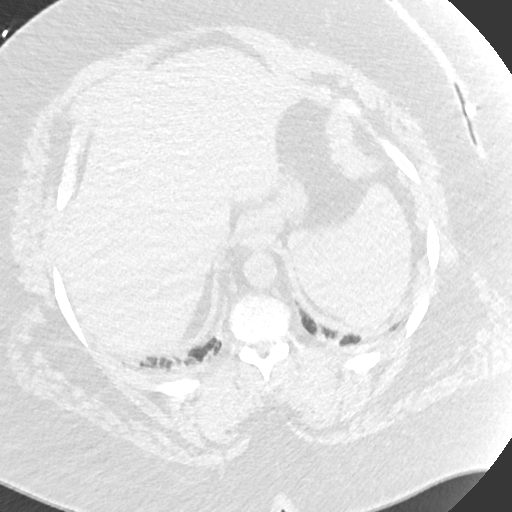
[im 65/168  mediastinal]
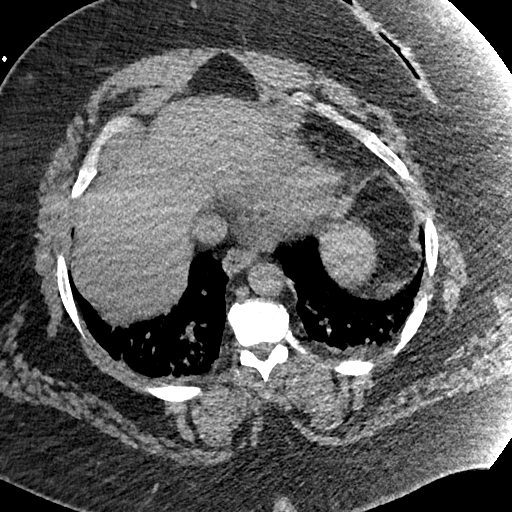
[im 65/168  lung]
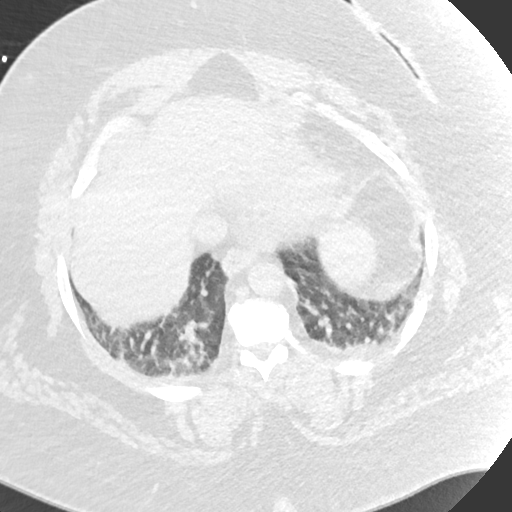
[im 78/168  lung]
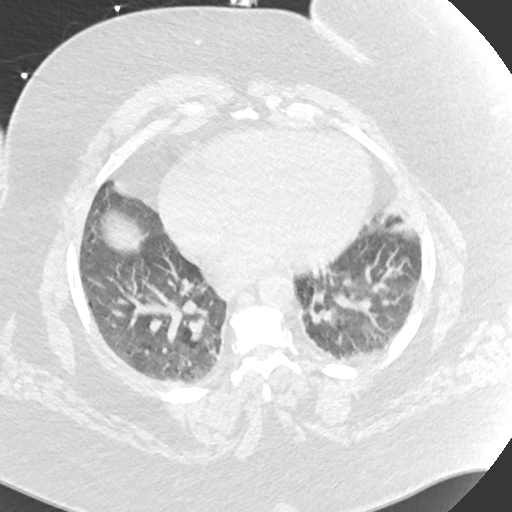
[im 90/168  lung]
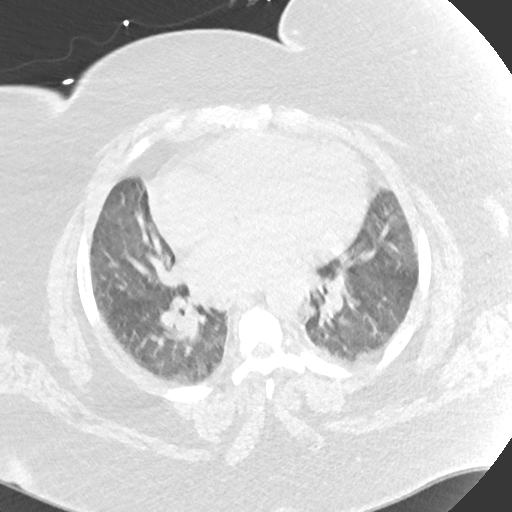
[im 103/168  lung]
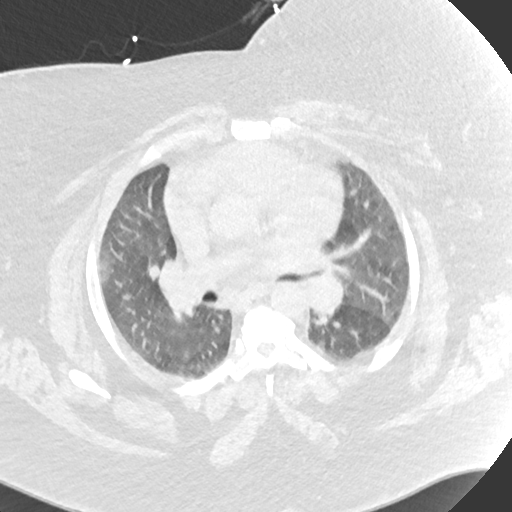
[im 116/168  mediastinal]
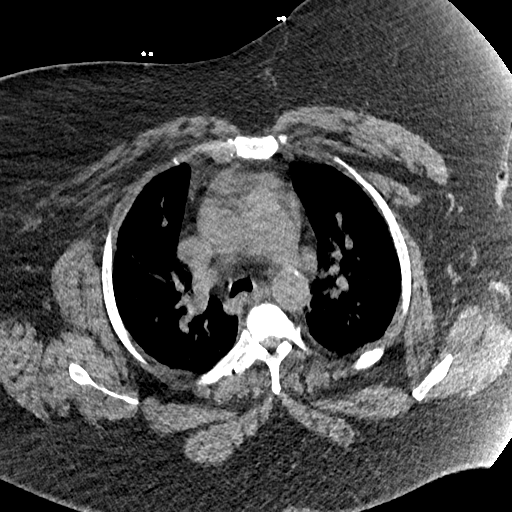
[im 116/168  lung]
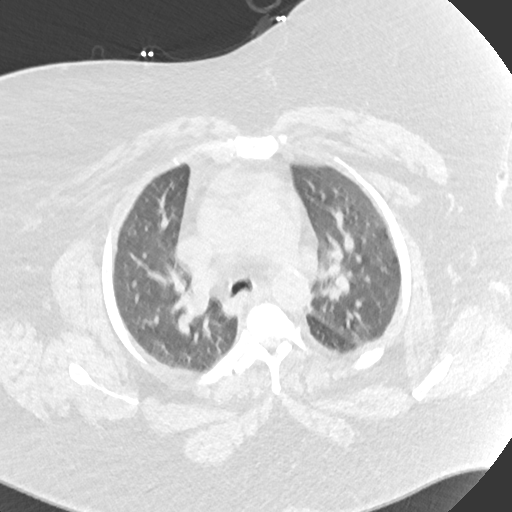
[im 129/168  lung]
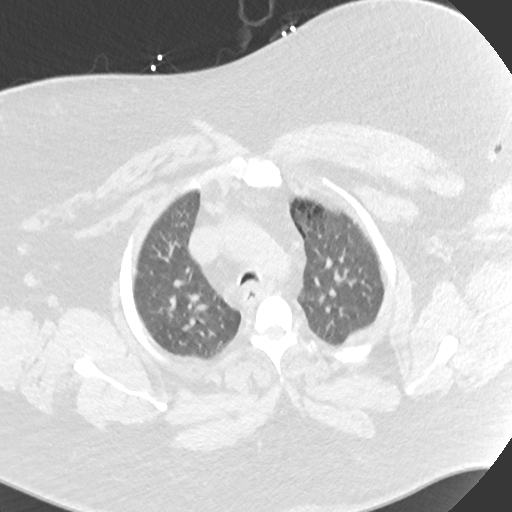
[im 142/168  lung]
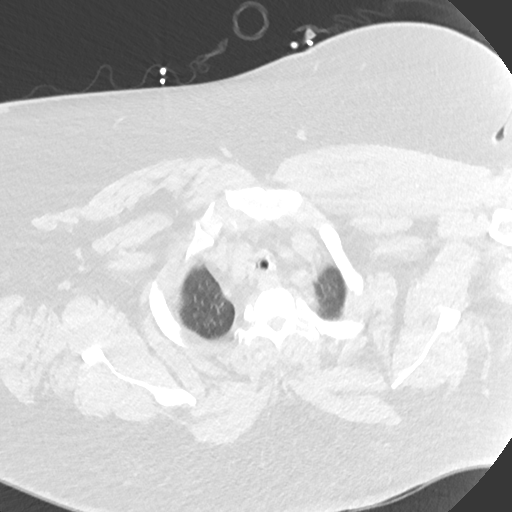
[im 155/168  lung]
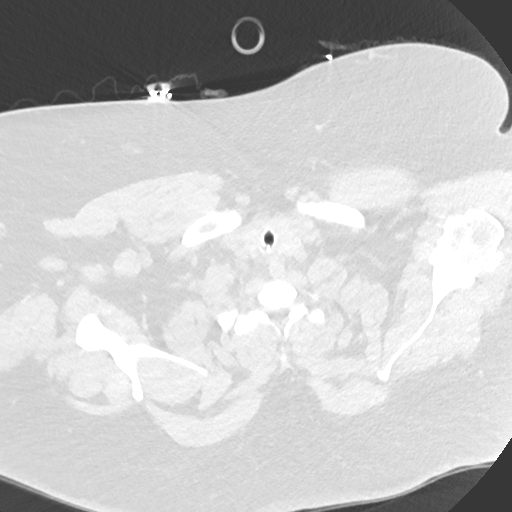

[Series 5: cor · coronal · 0.71mm/px · 3 of 227 slices shown]
[im 46/227  lung]
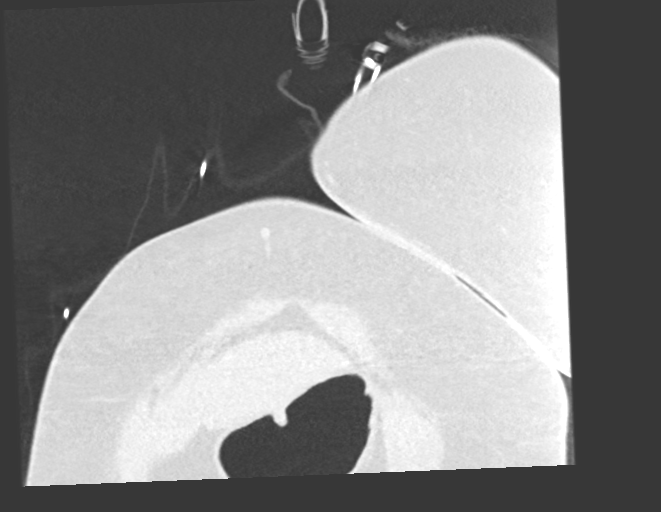
[im 91/227  lung]
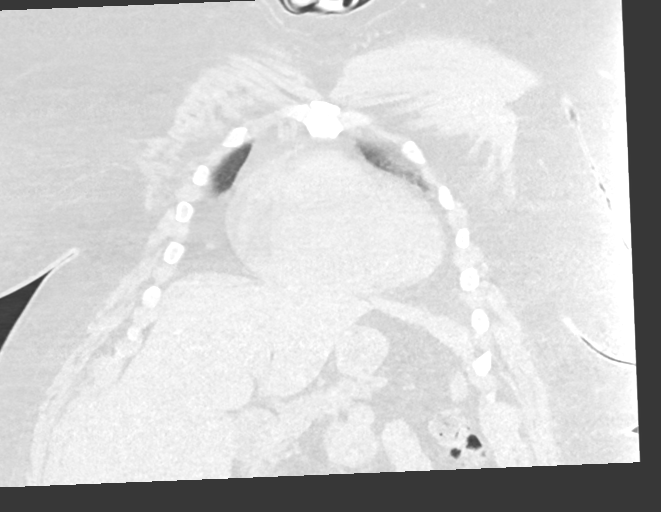
[im 136/227  lung]
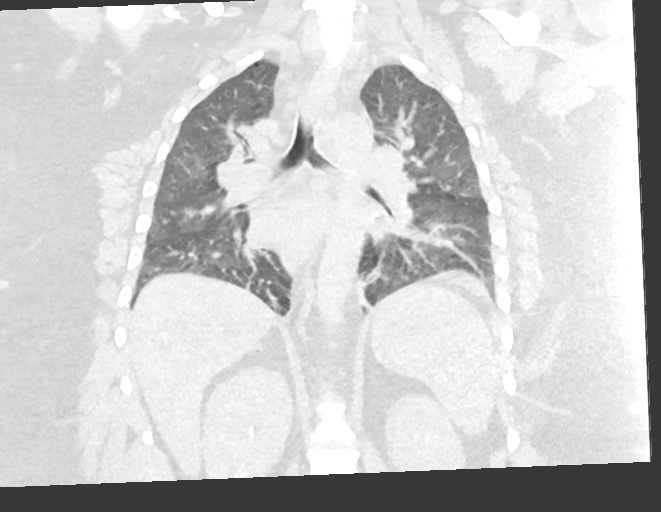

[15 of 36 positions shown; findings below may reference images not displayed]

FINDINGS: Cardiovascular: Upper normal size of cardiac chambers.
Atherosclerotic calcifications aorta. Trace pericardial fluid. Aorta
normal caliber.

Mediastinum/Nodes: Tracheostomy tube within trachea above carina.
Esophagus unremarkable. Base of cervical region normal appearance.
Scattered normal size mediastinal lymph nodes. No thoracic
adenopathy.

Lungs/Pleura: Dependent opacities in the lower lobes favoring
atelectasis. No definite infiltrate, pleural effusion, or
pneumothorax.

Upper Abdomen: Excreted contrast material within renal collecting
systems. Indeterminate exophytic lesion posterior RIGHT kidney is
noted on CT abdomen same date remaining visualized upper abdomen
unremarkable.

Musculoskeletal: No acute osseous findings.
IMPRESSION: Bibasilar atelectasis.

No other significant intrathoracic abnormalities.

## 2023-06-09 DIAGNOSIS — N1832 Chronic kidney disease, stage 3b: Secondary | ICD-10-CM | POA: Diagnosis present

## 2023-06-09 DIAGNOSIS — N183 Chronic kidney disease, stage 3 unspecified: Secondary | ICD-10-CM | POA: Diagnosis present

## 2023-06-09 DIAGNOSIS — F32A Depression, unspecified: Secondary | ICD-10-CM | POA: Insufficient documentation

## 2023-06-09 DIAGNOSIS — N1831 Chronic kidney disease, stage 3a: Secondary | ICD-10-CM | POA: Diagnosis present

## 2023-07-02 ENCOUNTER — Inpatient Hospital Stay
Admission: EM | Admit: 2023-07-02 | Discharge: 2023-07-06 | DRG: 872 | Disposition: A | Discharge status: Home Health | Payer: Medicaid Other | Attending: Internal Medicine | Admitting: Internal Medicine

## 2023-07-02 ENCOUNTER — Emergency Department: Payer: Medicaid Other

## 2023-07-02 ENCOUNTER — Other Ambulatory Visit: Payer: Self-pay

## 2023-07-02 DIAGNOSIS — Z79899 Other long term (current) drug therapy: Secondary | ICD-10-CM | POA: Diagnosis not present

## 2023-07-02 DIAGNOSIS — Z6841 Body Mass Index (BMI) 40.0 and over, adult: Secondary | ICD-10-CM

## 2023-07-02 DIAGNOSIS — E876 Hypokalemia: Secondary | ICD-10-CM | POA: Diagnosis present

## 2023-07-02 DIAGNOSIS — N183 Chronic kidney disease, stage 3 unspecified: Secondary | ICD-10-CM | POA: Diagnosis present

## 2023-07-02 DIAGNOSIS — L97429 Non-pressure chronic ulcer of left heel and midfoot with unspecified severity: Secondary | ICD-10-CM | POA: Diagnosis present

## 2023-07-02 DIAGNOSIS — I5032 Chronic diastolic (congestive) heart failure: Secondary | ICD-10-CM | POA: Diagnosis present

## 2023-07-02 DIAGNOSIS — M549 Dorsalgia, unspecified: Secondary | ICD-10-CM | POA: Diagnosis present

## 2023-07-02 DIAGNOSIS — M79605 Pain in left leg: Secondary | ICD-10-CM | POA: Diagnosis present

## 2023-07-02 DIAGNOSIS — L03116 Cellulitis of left lower limb: Secondary | ICD-10-CM | POA: Diagnosis present

## 2023-07-02 DIAGNOSIS — A419 Sepsis, unspecified organism: Principal | ICD-10-CM | POA: Diagnosis present

## 2023-07-02 DIAGNOSIS — R7303 Prediabetes: Secondary | ICD-10-CM | POA: Diagnosis present

## 2023-07-02 DIAGNOSIS — J9611 Chronic respiratory failure with hypoxia: Secondary | ICD-10-CM | POA: Diagnosis present

## 2023-07-02 DIAGNOSIS — N1831 Chronic kidney disease, stage 3a: Secondary | ICD-10-CM | POA: Diagnosis present

## 2023-07-02 DIAGNOSIS — Z1152 Encounter for screening for COVID-19: Secondary | ICD-10-CM

## 2023-07-02 DIAGNOSIS — N1832 Chronic kidney disease, stage 3b: Secondary | ICD-10-CM | POA: Diagnosis present

## 2023-07-02 DIAGNOSIS — F419 Anxiety disorder, unspecified: Secondary | ICD-10-CM | POA: Diagnosis present

## 2023-07-02 DIAGNOSIS — Z9981 Dependence on supplemental oxygen: Secondary | ICD-10-CM

## 2023-07-02 DIAGNOSIS — F319 Bipolar disorder, unspecified: Secondary | ICD-10-CM | POA: Diagnosis present

## 2023-07-02 DIAGNOSIS — Z930 Tracheostomy status: Secondary | ICD-10-CM

## 2023-07-02 DIAGNOSIS — E662 Morbid (severe) obesity with alveolar hypoventilation: Secondary | ICD-10-CM | POA: Diagnosis present

## 2023-07-02 DIAGNOSIS — I10 Essential (primary) hypertension: Secondary | ICD-10-CM | POA: Diagnosis present

## 2023-07-02 DIAGNOSIS — I130 Hypertensive heart and chronic kidney disease with heart failure and stage 1 through stage 4 chronic kidney disease, or unspecified chronic kidney disease: Secondary | ICD-10-CM | POA: Diagnosis present

## 2023-07-02 DIAGNOSIS — R531 Weakness: Secondary | ICD-10-CM | POA: Diagnosis present

## 2023-07-02 DIAGNOSIS — Z9911 Dependence on respirator [ventilator] status: Secondary | ICD-10-CM | POA: Diagnosis not present

## 2023-07-02 DIAGNOSIS — G8929 Other chronic pain: Secondary | ICD-10-CM | POA: Diagnosis present

## 2023-07-02 LAB — COMPREHENSIVE METABOLIC PANEL
ALT: 14 U/L (ref 0–44)
AST: 21 U/L (ref 15–41)
Albumin: 3.9 g/dL (ref 3.5–5.0)
Alkaline Phosphatase: 64 U/L (ref 38–126)
Anion gap: 13 (ref 5–15)
BUN: 20 mg/dL (ref 6–20)
CO2: 24 mmol/L (ref 22–32)
Calcium: 9.3 mg/dL (ref 8.9–10.3)
Chloride: 98 mmol/L (ref 98–111)
Creatinine, Ser: 1.31 mg/dL — ABNORMAL HIGH (ref 0.44–1.00)
GFR, Estimated: 51 mL/min — ABNORMAL LOW (ref >60)
Glucose, Bld: 109 mg/dL — ABNORMAL HIGH (ref 70–99)
Potassium: 4.4 mmol/L (ref 3.5–5.1)
Sodium: 135 mmol/L (ref 135–145)
Total Bilirubin: 0.9 mg/dL (ref 0.3–1.2)
Total Protein: 8 g/dL (ref 6.5–8.1)

## 2023-07-02 LAB — CBC WITH DIFFERENTIAL/PLATELET
Abs Immature Granulocytes: 0.11 10*3/uL — ABNORMAL HIGH (ref 0.00–0.07)
Basophils Absolute: 0 10*3/uL (ref 0.0–0.1)
Basophils Relative: 0 %
Eosinophils Absolute: 0 10*3/uL (ref 0.0–0.5)
Eosinophils Relative: 0 %
HCT: 33.6 % — ABNORMAL LOW (ref 36.0–46.0)
Hemoglobin: 11.1 g/dL — ABNORMAL LOW (ref 12.0–15.0)
Immature Granulocytes: 1 %
Lymphocytes Relative: 8 %
Lymphs Abs: 1.3 10*3/uL (ref 0.7–4.0)
MCH: 26.7 pg (ref 26.0–34.0)
MCHC: 33 g/dL (ref 30.0–36.0)
MCV: 80.8 fL (ref 80.0–100.0)
Monocytes Absolute: 0.6 10*3/uL (ref 0.1–1.0)
Monocytes Relative: 4 %
Neutro Abs: 14 10*3/uL — ABNORMAL HIGH (ref 1.7–7.7)
Neutrophils Relative %: 87 %
Platelets: 180 10*3/uL (ref 150–400)
RBC: 4.16 MIL/uL (ref 3.87–5.11)
RDW: 16.8 % — ABNORMAL HIGH (ref 11.5–15.5)
WBC: 16 10*3/uL — ABNORMAL HIGH (ref 4.0–10.5)
nRBC: 0 % (ref 0.0–0.2)

## 2023-07-02 LAB — PROTIME-INR
INR: 1.1 (ref 0.8–1.2)
Prothrombin Time: 14.8 seconds (ref 11.4–15.2)

## 2023-07-02 LAB — APTT: aPTT: 24 seconds (ref 24–36)

## 2023-07-02 LAB — RESP PANEL BY RT-PCR (FLU A&B, COVID) ARPGX2
Influenza A by PCR: NEGATIVE
Influenza B by PCR: NEGATIVE
SARS Coronavirus 2 by RT PCR: NEGATIVE

## 2023-07-02 LAB — MRSA NEXT GEN BY PCR, NASAL: MRSA by PCR Next Gen: NOT DETECTED

## 2023-07-02 LAB — LACTIC ACID, PLASMA
Lactic Acid, Venous: 1.2 mmol/L (ref 0.5–1.9)
Lactic Acid, Venous: 0.7 mmol/L (ref 0.5–1.9)

## 2023-07-02 LAB — BRAIN NATRIURETIC PEPTIDE: B Natriuretic Peptide: 46.6 pg/mL (ref 0.0–100.0)

## 2023-07-02 MED ORDER — HYDROXYZINE HCL 25 MG PO TABS
50.0000 mg | ORAL_TABLET | Freq: Every evening | ORAL | Status: DC | PRN
  Administered 2023-07-05: 50 mg via ORAL
  Filled 2023-07-02: qty 2

## 2023-07-02 MED ORDER — ENOXAPARIN SODIUM 100 MG/ML IJ SOSY
0.5000 mg/kg | PREFILLED_SYRINGE | INTRAMUSCULAR | Status: DC
  Administered 2023-07-03 – 2023-07-05 (×3): 90 mg via SUBCUTANEOUS
  Filled 2023-07-02 (×6): qty 1

## 2023-07-02 MED ORDER — VANCOMYCIN HCL IN DEXTROSE 1-5 GM/200ML-% IV SOLN: 1000.0000 mg | Freq: Once | INTRAVENOUS | Status: DC

## 2023-07-02 MED ORDER — ACETAMINOPHEN 650 MG RE SUPP: 650.0000 mg | Freq: Four times a day (QID) | RECTAL | Status: DC | PRN

## 2023-07-02 MED ORDER — SODIUM CHLORIDE 0.9% FLUSH
3.0000 mL | Freq: Two times a day (BID) | INTRAVENOUS | Status: DC
  Administered 2023-07-02 – 2023-07-05 (×7): 3 mL via INTRAVENOUS

## 2023-07-02 MED ORDER — VANCOMYCIN HCL 2000 MG/400ML IV SOLN
2000.0000 mg | Freq: Once | INTRAVENOUS | Status: AC
  Administered 2023-07-02: 2000 mg via INTRAVENOUS
  Filled 2023-07-02: qty 400

## 2023-07-02 MED ORDER — ONDANSETRON HCL 4 MG/2ML IJ SOLN: 4.0000 mg | Freq: Four times a day (QID) | INTRAMUSCULAR | Status: DC | PRN

## 2023-07-02 MED ORDER — HYDROMORPHONE HCL 1 MG/ML IJ SOLN
0.5000 mg | INTRAMUSCULAR | Status: DC | PRN
  Administered 2023-07-03 (×2): 0.5 mg via INTRAVENOUS
  Administered 2023-07-04 – 2023-07-06 (×8): 1 mg via INTRAVENOUS
  Filled 2023-07-02 (×10): qty 1

## 2023-07-02 MED ORDER — HYDROMORPHONE HCL 1 MG/ML IJ SOLN: 0.5000 mg | INTRAMUSCULAR | Status: DC | PRN

## 2023-07-02 MED ORDER — METRONIDAZOLE 500 MG/100ML IV SOLN
500.0000 mg | Freq: Once | INTRAVENOUS | Status: AC
  Administered 2023-07-02: 500 mg via INTRAVENOUS
  Filled 2023-07-02: qty 100

## 2023-07-02 MED ORDER — POLYETHYLENE GLYCOL 3350 17 G PO PACK: 17.0000 g | PACK | Freq: Every day | ORAL | Status: DC | PRN

## 2023-07-02 MED ORDER — ACETAMINOPHEN 500 MG PO TABS
1000.0000 mg | ORAL_TABLET | ORAL | Status: AC
  Administered 2023-07-02: 1000 mg via ORAL
  Filled 2023-07-02: qty 2

## 2023-07-02 MED ORDER — ONDANSETRON HCL 4 MG PO TABS: 4.0000 mg | ORAL_TABLET | Freq: Four times a day (QID) | ORAL | Status: DC | PRN

## 2023-07-02 MED ORDER — OXYCODONE-ACETAMINOPHEN 7.5-325 MG PO TABS
1.0000 | ORAL_TABLET | Freq: Four times a day (QID) | ORAL | Status: DC | PRN
  Administered 2023-07-02 – 2023-07-04 (×5): 1 via ORAL
  Filled 2023-07-02 (×5): qty 1

## 2023-07-02 MED ORDER — ACETAMINOPHEN 325 MG PO TABS
650.0000 mg | ORAL_TABLET | Freq: Four times a day (QID) | ORAL | Status: DC | PRN
  Administered 2023-07-02: 650 mg via ORAL
  Filled 2023-07-02: qty 2

## 2023-07-02 MED ORDER — ENOXAPARIN SODIUM 40 MG/0.4ML IJ SOSY: 40.0000 mg | PREFILLED_SYRINGE | INTRAMUSCULAR | Status: DC

## 2023-07-02 MED ORDER — LACTATED RINGERS IV SOLN: INTRAVENOUS | Status: AC

## 2023-07-02 MED ORDER — CYCLOBENZAPRINE HCL 5 MG PO TABS
5.0000 mg | ORAL_TABLET | Freq: Every day | ORAL | Status: DC
  Administered 2023-07-02 – 2023-07-05 (×4): 5 mg via ORAL
  Filled 2023-07-02 (×4): qty 1

## 2023-07-02 MED ORDER — SODIUM CHLORIDE 0.9 % IV SOLN
2.0000 g | INTRAVENOUS | Status: DC
  Administered 2023-07-02 – 2023-07-03 (×2): 2 g via INTRAVENOUS
  Filled 2023-07-02 (×2): qty 20

## 2023-07-02 MED ORDER — IPRATROPIUM-ALBUTEROL 0.5-2.5 (3) MG/3ML IN SOLN: 3.0000 mL | Freq: Four times a day (QID) | RESPIRATORY_TRACT | Status: DC | PRN

## 2023-07-02 MED ORDER — SODIUM CHLORIDE 0.9 % IV SOLN
2.0000 g | Freq: Once | INTRAVENOUS | Status: AC
  Administered 2023-07-02: 2 g via INTRAVENOUS
  Filled 2023-07-02: qty 12.5

## 2023-07-02 MED ORDER — LACTATED RINGERS IV SOLN: INTRAVENOUS | Status: DC

## 2023-07-02 NOTE — ED Triage Notes (Addendum)
First nurse note: Pt to ED [facility] from home for left foot pain, fever 102.9. pt has trach on RA.  Pt refused most of EMS care and would not remove blankets.

## 2023-07-02 NOTE — Progress Notes (Signed)
   07/02/23 2347  Oxygen Therapy/Pulse Ox  O2 Device Tracheostomy Collar  O2 Therapy Oxygen humidified  O2 Flow Rate (L/min) 5 L/min  FiO2 (%) 28 %   Patient requested to come off ventilator at this time, placed on Trach Collar 5L 28%. Will call when ready to go back on vent. Family members at bedside but brought the wrong equipment, will bring home vent tomorrow.

## 2023-07-02 NOTE — Assessment & Plan Note (Signed)
Renal function is stable.  Will continue to monitor while admitted.

## 2023-07-02 NOTE — ED Notes (Signed)
Pt was turned down to 3L oxygen by provider.

## 2023-07-02 NOTE — Assessment & Plan Note (Addendum)
Per chart review, patient has a history of HFpEF with last EF of 55-60% in November 2023.  She is on chronic Lasix.  On chest x-ray, there was concern for possible edema; this could be due to body habitus though.  Historically her BNP has always been within normal limits due to morbid obesity.  Given high fevers with high risk for dehydration, will hold off on IV Lasix at this time.  - Strict in and out - Daily weights - Lasix IV if increasing oxygen requirements

## 2023-07-02 NOTE — ED Provider Notes (Addendum)
[facility] Provider Note    Event Date/Time   First MD Initiated Contact with Patient 07/02/23 1556     (approximate)   History   Foot Pain   HPI  Sandra Holloway is a 47 y.o. female on review of [facility] notes from July 15 has a history of obesity hypoventilation, tobacco use and tracheostomy   Patient reports she has had some issues with her left foot chronically, but over the last day suddenly started having a fever pain and swelling in the left foot and left lower leg.  It came on rather abruptly  No open wounds or injuries.  No difficulty breathing.  Has tracheostomy.  Physical Exam   Triage Vital Signs: ED Triage Vitals  Encounter Vitals Group     BP 07/02/23 1507 (!) 177/81     Systolic BP Percentile --      Diastolic BP Percentile --      Pulse Rate 07/02/23 1507 96     Resp 07/02/23 1507 (!) 24     Temp 07/02/23 1507 (!) 103.2 F (39.6 C)     Temp Source 07/02/23 1507 Oral     SpO2 07/02/23 1507 97 %     Weight 07/02/23 1509 (!) 400 lb (181.4 kg)     Height 07/02/23 1509 5\' 2"  (1.575 m)     Head Circumference --      Peak Flow --      Pain Score 07/02/23 1508 10     Pain Loc --      Pain Education --      Exclude from Growth Chart --     Most recent vital signs: Vitals:   07/02/23 1815 07/02/23 1830  BP:    Pulse: (!) 107 (!) 102  Resp: (!) 29 (!) 27  Temp:    SpO2: 92% 99%     General: Awake, no distress.  CV:  Good peripheral perfusion.  Normal sounds Resp:  Normal effort.  Slightly central rhonchorous sounds, no difficulty respiration.  Tracheostomy site clean and intact.  Patient respirating with tachypnea but no noted cute respiratory abnormality other than increased rate.  No wheezing Abd:  No distention.  Soft nontender Other:  Right lower extremity appears normal.  Left lower extremity tenderness with warmth and mild erythema across the majority of the foot and approximately to the level of the left mid shin.  No  obvious crepitance no skin breakdown necrosis or purpura.  There is warm to touch though.  No obvious breaks or lesions   ED Results / Procedures / Treatments   Labs (all labs ordered are listed, but only abnormal results are displayed) Labs Reviewed  CBC WITH DIFFERENTIAL/PLATELET - Abnormal; Notable for the following components:      Result Value   WBC 16.0 (*)    Hemoglobin 11.1 (*)    HCT 33.6 (*)    RDW 16.8 (*)    Neutro Abs 14.0 (*)    Abs Immature Granulocytes 0.11 (*)    All other components within normal limits  COMPREHENSIVE METABOLIC PANEL - Abnormal; Notable for the following components:   Glucose, Bld 109 (*)    Creatinine, Ser 1.31 (*)    GFR, Estimated 51 (*)    All other components within normal limits  RESP PANEL BY RT-PCR (FLU A&B, COVID) ARPGX2  CULTURE, BLOOD (ROUTINE X 2)  CULTURE, BLOOD (ROUTINE X 2)  LACTIC ACID, PLASMA  PROTIME-INR  APTT  LACTIC ACID, PLASMA  URINALYSIS,  W/ REFLEX TO CULTURE (INFECTION SUSPECTED)  POC URINE PREG, ED     EKG And interpreted by me at 1725 heart rate 100 QRS 90 QTc 440 Sinus tachycardia, mild nonspecific T wave abnormality  RADIOLOGY  Imaging of the left foot and left tib-fib interpreted by me as negative for acute gas or obvious lytic lesion  US Venous Img Lower Unilateral Left  Result Date: 07/02/2023 CLINICAL DATA:  Edema.  Cellulitis.  Rule out DVT. EXAM: Left LOWER EXTREMITY VENOUS DOPPLER ULTRASOUND TECHNIQUE: Gray-scale sonography with compression, as well as color and duplex ultrasound, were performed to evaluate the deep venous system(s) from the level of the common femoral vein through the popliteal and proximal calf veins. COMPARISON:  None Available. FINDINGS: VENOUS Normal compressibility of the common femoral, superficial femoral, and popliteal veins, as well as the visualized calf veins. Visualized portions of profunda femoral vein and great saphenous vein unremarkable. No filling defects to suggest DVT  on grayscale or color Doppler imaging. Doppler waveforms show normal direction of venous flow, normal respiratory plasticity and response to augmentation. Limited views of the contralateral common femoral vein are unremarkable. OTHER None. Limitations: Exam detail is diminished secondary to large fatty habitus and poor patient mobility. IMPRESSION: No femoropopliteal DVT nor evidence of DVT within the visualized calf veins. Electronically Signed   By: Signa Kell M.D.   On: 07/02/2023 17:34   DG Tibia/Fibula Left  Result Date: 07/02/2023 CLINICAL DATA:  Pain and swelling EXAM: LEFT TIBIA AND FIBULA - 2 VIEW COMPARISON:  None Available. FINDINGS: No recent fracture or dislocation is seen. There are no focal lytic lesions. Degenerative changes are noted with bony spurs in medial, lateral and patellofemoral compartments in the left knee. There is narrowing of joint space in the medial compartment. Plantar spur is seen in calcaneus. There is edema in subcutaneous plane. IMPRESSION: No fracture or dislocation is seen in left tibia and fibula. No focal lytic lesions are seen. Degenerative changes are noted in left knee. Electronically Signed   By: Ernie Avena M.D.   On: 07/02/2023 16:54   DG Chest Port 1 View  Result Date: 07/02/2023 CLINICAL DATA:  Edema in the lower extremities EXAM: PORTABLE CHEST 1 VIEW COMPARISON:  11/03/2022 FINDINGS: Transverse diameter of heart is increased. Central pulmonary vessels are prominent suggesting CHF. There are no focal pulmonary infiltrates. Lateral CP angles are indistinct. There is no pneumothorax. Tip of the tracheostomy is 5 cm above the carina. IMPRESSION: Technically limited study. Cardiomegaly. Central pulmonary vessels are prominent suggesting CHF. Possible small bilateral pleural effusions. Electronically Signed   By: Ernie Avena M.D.   On: 07/02/2023 16:52   DG Foot Complete Left  Result Date: 07/02/2023 CLINICAL DATA:  Pain and swelling EXAM:  LEFT FOOT - COMPLETE 3+ VIEW COMPARISON:  None Available. FINDINGS: Examination is technically limited due to less than optimal positioning and marked soft tissue swelling. As far as seen, no displaced fracture or dislocation is seen. There is marked soft tissue swelling over the dorsum. Plantar spur is seen in calcaneus. Small bony spurs are noted in the intertarsal and tarsometatarsal joints in the lateral view. IMPRESSION: Technically limited study. No displaced fracture or dislocation is seen. Degenerative changes are noted in multiple joints. There is marked soft tissue swelling over the dorsum. No opaque foreign bodies are seen. Electronically Signed   By: Ernie Avena M.D.   On: 07/02/2023 16:50      PROCEDURES:  Critical Care performed: Yes, see critical care  procedure note(s)  CRITICAL CARE Performed by: Sharyn Creamer   Total critical care time: 30 minutes  Critical care time was exclusive of separately billable procedures and treating other patients.  Critical care was necessary to treat or prevent imminent or life-threatening deterioration.  Critical care was time spent personally by me on the following activities: development of treatment plan with patient and/or surrogate as well as nursing, discussions with consultants, evaluation of patient's response to treatment, examination of patient, obtaining history from patient or surrogate, ordering and performing treatments and interventions, ordering and review of laboratory studies, ordering and review of radiographic studies, pulse oximetry and re-evaluation of patient's condition.  Procedures   MEDICATIONS ORDERED IN ED: Medications  vancomycin (VANCOREADY) IVPB 2000 mg/400 mL (2,000 mg Intravenous New Bag/Given 07/02/23 1817)  lactated ringers infusion ( Intravenous New Bag/Given 07/02/23 1826)  ceFEPIme (MAXIPIME) 2 g in sodium chloride 0.9 % 100 mL IVPB (0 g Intravenous Stopped 07/02/23 1709)  metroNIDAZOLE (FLAGYL) IVPB 500  mg (0 mg Intravenous Stopped 07/02/23 1814)  acetaminophen (TYLENOL) tablet 1,000 mg (1,000 mg Oral Given 07/02/23 1643)     IMPRESSION / MDM / ASSESSMENT AND PLAN / ED COURSE  I reviewed the triage vital signs and the nursing notes.                              Differential diagnosis includes, but is not limited to, cellulitis, sepsis, other considerations include phlegmasia cerulea, DVT, necrotizing process, etc.  She has focal obvious infection involving the left lower extremity.  She does not on clinical exam have clear findings to suggest an acute necrotizing process but await imaging as well as DVT exclusion study.  Will evaluate for additional sources of infection including pulmonary given chronic tracheostomy use, but clinical history she provides as well as examination point towards the left lower leg and cellulitis to be most obvious source.  There is no evidence to suggest an obvious abscess by clinical exam or acute surgical emergency.  Labs interpreted as leukocytosis.  Awaiting lactic acid  Code sepsis initiated.  Patient with tachypnea, fever,  Patient's presentation is most consistent with acute presentation with potential threat to life or bodily function.    The patient is on the cardiac monitor to evaluate for evidence of arrhythmia and/or significant heart rate changes.   Clinical Course as of 07/02/23 1842  Sat Jul 02, 2023  1717 Dr. Aundria Rud advises patient may be admitted to stepdown on hospitalist service for home vent needs. CCM available to consult prn [MQ]    Clinical Course User Index [MQ] Sharyn Creamer, MD   ----------------------------------------- 6:41 PM on 07/02/2023 ----------------------------------------- Consulted with and patient accepted to hospital service by Dr. Huel Cote  Patient understanding agreeable with plan for admission  FINAL CLINICAL IMPRESSION(S) / ED DIAGNOSES   Final diagnoses:  Sepsis, due to unspecified organism, unspecified  whether acute organ dysfunction present (HCC)  Cellulitis of left leg     Rx / DC Orders   ED Discharge Orders     None        Note:  This document was prepared using Dragon voice recognition software and may include unintentional dictation errors.   Sharyn Creamer, MD 07/02/23 Nobie Putnam    Sharyn Creamer, MD 07/02/23 [phone]

## 2023-07-02 NOTE — Consult Note (Signed)
CODE SEPSIS - PHARMACY COMMUNICATION  **Broad Spectrum Antibiotics should be administered within 1 hour of Sepsis diagnosis**  Time Code Sepsis Called/Page Received: 1615  Antibiotics Ordered: cefepime, vancomycin, and Flagyl  Time of 1st antibiotic administration: 1638  Additional action taken by pharmacy: N/A  Barrie Folk ,PharmD Clinical Pharmacist  07/02/2023  4:19 PM

## 2023-07-02 NOTE — Assessment & Plan Note (Addendum)
Patient is presenting with high fever, tachycardia, tachypnea and evidence of cellulitis on the left foot.    She does meet SIRS criteria, however there is no evidence of end-organ damage to suggest sepsis.  Renal and pulmonary function are at baseline, no LFT abnormalities etc.  She is certainly at high risk for developing sepsis though.  - Telemetry monitoring - Tylenol as needed for fever - Discontinue vancomycin, cefepime and Flagyl - Start Rocephin 2 g daily - Blood cultures pending - Gentle IV fluids

## 2023-07-02 NOTE — ED Notes (Signed)
RT at bedside to evaluate pt. Ultrasound at bedside.

## 2023-07-02 NOTE — Progress Notes (Signed)
PHARMACIST - PHYSICIAN COMMUNICATION  CONCERNING:  Enoxaparin (Lovenox) for DVT Prophylaxis   RECOMMENDATION: Patient was prescribed enoxaprin 40mg  q24 hours for VTE prophylaxis.   Filed Weights   07/02/23 1509  Weight: (!) 181.4 kg (400 lb)    Body mass index is 73.16 kg/m.  Estimated Creatinine Clearance: 86 mL/min (A) (by C-G formula based on SCr of 1.31 mg/dL (H)).   Based on [facility] policy, patient is candidate for enoxaparin 0.5mg /kg TBW SQ every 24 hours based on BMI being >30.  DESCRIPTION: Pharmacy has adjusted enoxaparin dose per [facility] policy.  Patient is now receiving enoxaparin 90 mg every 24 hours   Thank you for involving pharmacy in this patient's care.   Rockwell Alexandria, PharmD Clinical Pharmacist 07/02/2023 6:52 PM

## 2023-07-02 NOTE — ED Triage Notes (Signed)
Patient states left foot pain that started 2 weeks ago, denies injury and/or trauma.

## 2023-07-02 NOTE — Progress Notes (Signed)
Patient declined trach care with the exception of changing her inner cannula. Extra trach supplies located at bedside. Patient states her home ventilator is unable to be brought to hospital tonight, possibly tomorrow. Patient states she would like to wear vent tonight but does not know her home settings. Hospital vent provided and patient placed on PS 10/5+ 28%. Patient tolerating well at this time. Patient has a #6 XLT trach - cuffless and states she does not wear a cuffed trach with her home vent.

## 2023-07-02 NOTE — Assessment & Plan Note (Signed)
Hypertensive at this time.  - Continue home regimen

## 2023-07-02 NOTE — Assessment & Plan Note (Signed)
Patient has a history of morbid obesity with current BMI of 73.16, complicating her care

## 2023-07-02 NOTE — Sepsis Progress Note (Signed)
Sepsis protocol is being followed by [facility]. 

## 2023-07-02 NOTE — H&P (Signed)
History and Physical    Patient: Sandra Holloway VVO:[id number] DOB: 05-Jan-1976 DOA: 07/02/2023 DOS: the patient was seen and examined on 07/02/2023 PCP: Dairl Ponder, MD  Patient coming from: Home  Chief Complaint:  Chief Complaint  Patient presents with   Foot Pain   HPI: Sandra Holloway is a 47 y.o. female with medical history significant of obesity hypoventilation syndrome with tracheostomy and nighttime ventilator dependence, CKD stage III, hypertension, prediabetes, bipolar disorder, anxiety/depression, who presents to the ED due to foot pain.  [name] states that for the last 1 to 2 days, she has been experiencing increasing left foot pain and fevers at home.  In addition, she has been experiencing fevers.  She denies any trauma to her leg.  Otherwise, she denies any other complaints including shortness of breath, chest pain, palpitations, nausea, vomiting, diarrhea, abdominal pain.  She states that otherwise, she feels well.  She feels that her current respiratory status is at her baseline.  ED course: On arrival to the ED, patient was febrile at 103.2 with blood pressure 177/81 and heart rate of 96.  She was saturating at 97% on a trach collar with 8 L of supplemental oxygen. Initial workup notable for WBC of 16.0, hemoglobin 11.1, glucose of 109, creatinine 1.  3 1, GFR 51.  Lactic acid within normal limits.  COVID-19 and influenza are negative.  Chest x-ray with cardiomegaly and prominent central pulmonary vessels.  Left foot x-ray with no osseous involvement.  Left tib-fib x-ray with no osseous involvement.  Left DVT study negative.  Patient started on vancomycin and cefepime; [facility] contacted for admission.  Review of Systems: As mentioned in the history of present illness. All other systems reviewed and are negative.  Past Medical History:  Diagnosis Date   Dyspnea    Hypertension    Past Surgical History:  Procedure Laterality Date   TRACHEOSTOMY     Social History:   reports that she does not currently use alcohol. She reports that she does not currently use drugs. No history on file for tobacco use.  No Known Allergies  History reviewed. No pertinent family history.  Prior to Admission medications   Medication Sig Start Date End Date Taking? Authorizing Provider  acetaminophen (TYLENOL) 325 MG tablet Take 2 tablets (650 mg total) by mouth every 6 (six) hours as needed for mild pain (or Fever >/= 101). 03/30/22   Lurene Shadow, MD  albuterol (VENTOLIN HFA) 108 (90 Base) MCG/ACT inhaler Inhale 2 puffs into the lungs every 6 (six) hours as needed for wheezing or shortness of breath. Patient not taking: Reported on 11/03/2022 10/21/22   Tresa Moore, MD  cyclobenzaprine (FLEXERIL) 5 MG tablet Take 5 mg by mouth at bedtime.    [provider]  furosemide (LASIX) 40 MG tablet Take 40 mg by mouth daily.    [provider]  hydrOXYzine (ATARAX) 50 MG tablet Take 1 tablet (50 mg total) by mouth at bedtime as needed for anxiety (prior to bipap). 11/09/22   Wouk, Wilfred Curtis, MD  ipratropium-albuterol (DUONEB) 0.5-2.5 (3) MG/3ML SOLN Take 3 mLs by nebulization every 6 (six) hours as needed. 10/21/22   Tresa Moore, MD  loratadine (CLARITIN) 10 MG tablet Take 10 mg by mouth daily.    [provider]  omeprazole (PRILOSEC) 20 MG capsule Take 20 mg by mouth daily.    [provider]  oxyCODONE-acetaminophen (PERCOCET) 7.5-325 MG tablet Take 1 tablet by mouth every 4 (four) hours as  needed for moderate pain or severe pain. 10/01/22   [provider]  terbinafine (LAMISIL) 250 MG tablet Take 1 tablet (250 mg total) by mouth daily. 11/10/22   Wouk, Wilfred Curtis, MD  triamcinolone cream (KENALOG) 0.1 % Apply 1 Application topically 2 (two) times daily. 11/09/22   Kathrynn Running, MD    Physical Exam: Vitals:   07/02/23 1800 07/02/23 1809 07/02/23 1815 07/02/23 1830  BP: (!) 142/69 (!) 142/69    Pulse: (!) 106  (!) 107 (!) 107 (!) 102  Resp: (!) 26 (!) 27 (!) 29 (!) 27  Temp:  (!) 102.6 F (39.2 C)    TempSrc:  Oral    SpO2: 93% 92% 92% 99%  Weight:      Height:       Physical Exam Vitals and nursing note reviewed.  Constitutional:      General: She is not in acute distress.    Appearance: She is morbidly obese. She is ill-appearing.  HENT:     Head: Normocephalic and atraumatic.  Eyes:     Conjunctiva/sclera: Conjunctivae normal.     Pupils: Pupils are equal, round, and reactive to light.  Cardiovascular:     Rate and Rhythm: Regular rhythm. Tachycardia present.     Heart sounds: No murmur heard. Pulmonary:     Effort: Pulmonary effort is normal. No respiratory distress.     Breath sounds: Normal breath sounds.  Abdominal:     General: Bowel sounds are normal.     Palpations: Abdomen is soft.  Musculoskeletal:     Comments: No lower extremity pitting edema  Skin:    General: Skin is warm and dry.     Comments: On the lateral and dorsal aspect of the left foot, there is notable swelling that extends to the superior aspect of the ankle circumferentially.  The skin is quite warm to the touch compared to the other.  There is very subtle erythema.  No streaking noted.  No ulcers or purulent drainage  Neurological:     Mental Status: She is alert and oriented to person, place, and time. Mental status is at baseline.  Psychiatric:        Mood and Affect: Mood normal.        Behavior: Behavior normal.    Data Reviewed: CBC with WBC of 16.0, hemoglobin 11.1, and platelets of 180 CMP with sodium of 135, potassium 4.4, bicarb 24, glucose 109, BUN 20, creatinine 1.31, AST 21, ALT 14, GFR 51 Lactic acid 1.1 COVID-19 and influenza negative INR PTT within normal limits  EKG personally reviewed.  Sinus rhythm with a rate of 102.  Nonspecific T wave inversions in inferior and lateral leads.  No ST or T wave changes concerning for acute ischemia.  US Venous Img Lower Unilateral  Left  Result Date: 07/02/2023 CLINICAL DATA:  Edema.  Cellulitis.  Rule out DVT. EXAM: Left LOWER EXTREMITY VENOUS DOPPLER ULTRASOUND TECHNIQUE: Gray-scale sonography with compression, as well as color and duplex ultrasound, were performed to evaluate the deep venous system(s) from the level of the common femoral vein through the popliteal and proximal calf veins. COMPARISON:  None Available. FINDINGS: VENOUS Normal compressibility of the common femoral, superficial femoral, and popliteal veins, as well as the visualized calf veins. Visualized portions of profunda femoral vein and great saphenous vein unremarkable. No filling defects to suggest DVT on grayscale or color Doppler imaging. Doppler waveforms show normal direction of venous flow, normal respiratory plasticity and response to augmentation.  Limited views of the contralateral common femoral vein are unremarkable. OTHER None. Limitations: Exam detail is diminished secondary to large fatty habitus and poor patient mobility. IMPRESSION: No femoropopliteal DVT nor evidence of DVT within the visualized calf veins. Electronically Signed   By: Signa Kell M.D.   On: 07/02/2023 17:34   DG Tibia/Fibula Left  Result Date: 07/02/2023 CLINICAL DATA:  Pain and swelling EXAM: LEFT TIBIA AND FIBULA - 2 VIEW COMPARISON:  None Available. FINDINGS: No recent fracture or dislocation is seen. There are no focal lytic lesions. Degenerative changes are noted with bony spurs in medial, lateral and patellofemoral compartments in the left knee. There is narrowing of joint space in the medial compartment. Plantar spur is seen in calcaneus. There is edema in subcutaneous plane. IMPRESSION: No fracture or dislocation is seen in left tibia and fibula. No focal lytic lesions are seen. Degenerative changes are noted in left knee. Electronically Signed   By: Ernie Avena M.D.   On: 07/02/2023 16:54   DG Chest Port 1 View  Result Date: 07/02/2023 CLINICAL DATA:  Edema in  the lower extremities EXAM: PORTABLE CHEST 1 VIEW COMPARISON:  11/03/2022 FINDINGS: Transverse diameter of heart is increased. Central pulmonary vessels are prominent suggesting CHF. There are no focal pulmonary infiltrates. Lateral CP angles are indistinct. There is no pneumothorax. Tip of the tracheostomy is 5 cm above the carina. IMPRESSION: Technically limited study. Cardiomegaly. Central pulmonary vessels are prominent suggesting CHF. Possible small bilateral pleural effusions. Electronically Signed   By: Ernie Avena M.D.   On: 07/02/2023 16:52   DG Foot Complete Left  Result Date: 07/02/2023 CLINICAL DATA:  Pain and swelling EXAM: LEFT FOOT - COMPLETE 3+ VIEW COMPARISON:  None Available. FINDINGS: Examination is technically limited due to less than optimal positioning and marked soft tissue swelling. As far as seen, no displaced fracture or dislocation is seen. There is marked soft tissue swelling over the dorsum. Plantar spur is seen in calcaneus. Small bony spurs are noted in the intertarsal and tarsometatarsal joints in the lateral view. IMPRESSION: Technically limited study. No displaced fracture or dislocation is seen. Degenerative changes are noted in multiple joints. There is marked soft tissue swelling over the dorsum. No opaque foreign bodies are seen. Electronically Signed   By: Ernie Avena M.D.   On: 07/02/2023 16:50    Results are pending, will review when available.  Assessment and Plan:  Cellulitis of left foot Patient is presenting with high fever, tachycardia, tachypnea and evidence of cellulitis on the left foot.    She does meet SIRS criteria, however there is no evidence of end-organ damage to suggest sepsis.  Renal and pulmonary function are at baseline, no LFT abnormalities etc.  She is certainly at high risk for developing sepsis though.  - Telemetry monitoring - Tylenol as needed for fever - Discontinue vancomycin, cefepime and Flagyl - Start Rocephin 2 g  daily - Blood cultures pending - Gentle IV fluids  Obesity hypoventilation syndrome (HCC) Patient has a history of complicated obesity hypoventilation syndrome, requiring ventilator support at nighttime.  Patient states she wears oxygen during daytime as needed.  Per chart review, there have been conflicting reports on how much oxygen patient actually wears during the daytime.  It seems that on her last admission, she was consistently between 5 to 8 L, which is what she is on now.  - Continue ventilator support at bedtime - Continue daytime supplemental oxygen between 5 to 8 L - Continue tracheostomy  care with frequent suctioning  Chronic heart failure with preserved ejection fraction [facility]) Per chart review, patient has a history of HFpEF with last EF of 55-60% in November 2023.  She is on chronic Lasix.  On chest x-ray, there was concern for possible edema; this could be due to body habitus though.  Historically her BNP has always been within normal limits due to morbid obesity.  Given high fevers with high risk for dehydration, will hold off on IV Lasix at this time.  - Strict in and out - Daily weights - Lasix IV if increasing oxygen requirements  Essential hypertension Hypertensive at this time.  - Continue home regimen  Morbid obesity with BMI of 70 and over, adult [facility]) Patient has a history of morbid obesity with current BMI of 73.16, complicating her care  Stage 3b chronic kidney disease (HCC) Renal function is stable.  Will continue to monitor while admitted.  Advance Care Planning:   Code Status: Full Code   Consults: None  Family Communication: No family at bedside  Severity of Illness: The appropriate patient status for this patient is OBSERVATION. Observation status is judged to be reasonable and necessary in order to provide the required intensity of service to ensure the patient's safety. The patient's presenting symptoms, physical exam findings, and initial  radiographic and laboratory data in the context of their medical condition is felt to place them at decreased risk for further clinical deterioration. Furthermore, it is anticipated that the patient will be medically stable for discharge from the hospital within 2 midnights of admission.   Author: Verdene Lennert, MD 07/02/2023 6:46 PM  For on call review [email or web address].

## 2023-07-02 NOTE — Consult Note (Signed)
PHARMACY -  BRIEF ANTIBIOTIC NOTE   Pharmacy has received consult(s) for vancomycin and cefepime dosing from an ED provider.  The patient's profile has been reviewed for ht/wt/allergies/indication/available labs.    One time order(s) placed for vancomycin 2 grams IV x 1 and cefepime 2 grams IV x 1.  Further antibiotics/pharmacy consults should be ordered by admitting physician if indicated.                       Thank you, Barrie Folk, PharmD 07/02/2023  4:17 PM

## 2023-07-02 NOTE — Assessment & Plan Note (Signed)
Patient has a history of complicated obesity hypoventilation syndrome, requiring ventilator support at nighttime.  Patient states she wears oxygen during daytime as needed.  Per chart review, there have been conflicting reports on how much oxygen patient actually wears during the daytime.  It seems that on her last admission, she was consistently between 5 to 8 L, which is what she is on now.  - Continue ventilator support at bedtime - Continue daytime supplemental oxygen between 5 to 8 L - Continue tracheostomy care with frequent suctioning

## 2023-07-03 DIAGNOSIS — A419 Sepsis, unspecified organism: Secondary | ICD-10-CM | POA: Diagnosis present

## 2023-07-03 DIAGNOSIS — L03116 Cellulitis of left lower limb: Secondary | ICD-10-CM | POA: Diagnosis not present

## 2023-07-03 LAB — BASIC METABOLIC PANEL WITH GFR
Anion gap: 10 (ref 5–15)
BUN: 17 mg/dL (ref 6–20)
CO2: 28 mmol/L (ref 22–32)
Calcium: 8.6 mg/dL — ABNORMAL LOW (ref 8.9–10.3)
Chloride: 100 mmol/L (ref 98–111)
Creatinine, Ser: 1.1 mg/dL — ABNORMAL HIGH (ref 0.44–1.00)
GFR, Estimated: >60 mL/min (ref >60)
Glucose, Bld: 119 mg/dL — ABNORMAL HIGH (ref 70–99)
Potassium: 3.3 mmol/L — ABNORMAL LOW (ref 3.5–5.1)
Sodium: 138 mmol/L (ref 135–145)

## 2023-07-03 LAB — CBC
HCT: 30.4 % — ABNORMAL LOW (ref 36.0–46.0)
Hemoglobin: 9.9 g/dL — ABNORMAL LOW (ref 12.0–15.0)
MCH: 26.7 pg (ref 26.0–34.0)
MCHC: 32.6 g/dL (ref 30.0–36.0)
MCV: 81.9 fL (ref 80.0–100.0)
Platelets: 187 10*3/uL (ref 150–400)
RBC: 3.71 MIL/uL — ABNORMAL LOW (ref 3.87–5.11)
RDW: 17.1 % — ABNORMAL HIGH (ref 11.5–15.5)
WBC: 11.5 10*3/uL — ABNORMAL HIGH (ref 4.0–10.5)
nRBC: 0 % (ref 0.0–0.2)

## 2023-07-03 MED ORDER — CHLORHEXIDINE GLUCONATE CLOTH 2 % EX PADS
6.0000 | MEDICATED_PAD | Freq: Every day | CUTANEOUS | Status: DC
  Administered 2023-07-03 – 2023-07-05 (×3): 6 via TOPICAL

## 2023-07-03 MED ORDER — MUPIROCIN 2 % EX OINT
1.0000 | TOPICAL_OINTMENT | Freq: Two times a day (BID) | CUTANEOUS | Status: DC
  Administered 2023-07-03 – 2023-07-06 (×8): 1 via NASAL
  Filled 2023-07-03: qty 22

## 2023-07-03 MED ORDER — POTASSIUM CHLORIDE CRYS ER 20 MEQ PO TBCR
40.0000 meq | EXTENDED_RELEASE_TABLET | Freq: Once | ORAL | Status: AC
  Administered 2023-07-03: 40 meq via ORAL
  Filled 2023-07-03: qty 2

## 2023-07-03 NOTE — Plan of Care (Signed)
  Problem: Education: Goal: Knowledge of General Education information will improve Description: Including pain rating scale, medication(s)/side effects and non-pharmacologic comfort measures 07/03/2023 0503 by Gaye Alken, RN Outcome: Progressing 07/03/2023 0501 by Gaye Alken, RN Outcome: Progressing   Problem: Health Behavior/Discharge Planning: Goal: Ability to manage health-related needs will improve 07/03/2023 0503 by Gaye Alken, RN Outcome: Progressing 07/03/2023 0501 by Gaye Alken, RN Outcome: Progressing   Problem: Clinical Measurements: Goal: Ability to maintain clinical measurements within normal limits will improve 07/03/2023 0503 by Gaye Alken, RN Outcome: Progressing 07/03/2023 0501 by Gaye Alken, RN Outcome: Progressing Goal: Will remain free from infection Outcome: Progressing Goal: Respiratory complications will improve 07/03/2023 0503 by Gaye Alken, RN Outcome: Progressing 07/03/2023 0501 by Gaye Alken, RN Outcome: Progressing Goal: Cardiovascular complication will be avoided Outcome: Progressing   Problem: Activity: Goal: Risk for activity intolerance will decrease 07/03/2023 0503 by Gaye Alken, RN Outcome: Progressing 07/03/2023 0501 by Gaye Alken, RN Outcome: Progressing   Problem: Coping: Goal: Level of anxiety will decrease 07/03/2023 0503 by Gaye Alken, RN Outcome: Progressing 07/03/2023 0501 by Gaye Alken, RN Outcome: Progressing   Problem: Pain Managment: Goal: General experience of comfort will improve Outcome: Progressing   Problem: Safety: Goal: Ability to remain free from injury will improve 07/03/2023 0503 by Gaye Alken, RN Outcome: Progressing 07/03/2023 0501 by Gaye Alken, RN Outcome: Progressing   Problem: Skin Integrity: Goal: Risk for impaired skin  integrity will decrease 07/03/2023 0503 by Gaye Alken, RN Outcome: Progressing 07/03/2023 0501 by Gaye Alken, RN Outcome: Progressing

## 2023-07-03 NOTE — Progress Notes (Addendum)
Progress Note    Sandra Holloway  ZOX:[id number] DOB: 01-Mar-1976  DOA: 07/02/2023 PCP: Dairl Ponder, MD      Brief Narrative:    Medical records reviewed and are as summarized below:  Sandra Holloway is a 47 y.o. female with medical history significant of obesity hypoventilation syndrome with tracheostomy and nighttime ventilator dependence, CKD stage IIIa, hypertension, prediabetes, bipolar disorder, anxiety/depression, who presented to the hospital because of left foot pain and fever. She was tachypneic, tachycardic and febrile in the ED with Tmax of 103.2 F.  She also had leukocytosis with WBC of 16,000.  She was admitted to the hospital for sepsis secondary to left leg and foot cellulitis.       Assessment/Plan:   Principal Problem:   Sepsis (HCC) Active Problems:   Obesity hypoventilation syndrome (HCC)   Cellulitis of left foot   Chronic heart failure with preserved ejection fraction (HCC)   Essential hypertension   Morbid obesity with BMI of 70 and over, adult (HCC)   Stage III chronic kidney disease (HCC)    Body mass index is 70.16 kg/m.  (Morbid obesity)    Sepsis secondary to left lower extremity nonpurulent cellulitis: Continue empiric IV ceftriaxone.  Follow-up blood cultures.   Hypokalemia: Replete potassium and monitor levels   Chronic diastolic CHF: Compensated.  Lasix on hold.  EF 55 to 60% on 2D echo in November 2023.   Chronic hypoxic respiratory failure, obese hypoventilation syndrome: She uses oxygen via trach collar during the daytime and uses a ventilator at night.   Other comorbidities include CKD stage IIIa, hypertension, morbid obesity      Diet Order             Diet Heart Room service appropriate? Yes; Fluid consistency: Thin  Diet effective now                            Consultants: None  Procedures: None    Medications:    Chlorhexidine Gluconate Cloth  6 each Topical Q0600    cyclobenzaprine  5 mg Oral QHS   enoxaparin (LOVENOX) injection  0.5 mg/kg Subcutaneous Q24H   mupirocin ointment  1 Application Nasal BID   sodium chloride flush  3 mL Intravenous Q12H   Continuous Infusions:  cefTRIAXone (ROCEPHIN)  IV Stopped (07/02/23 2247)     Anti-infectives (From admission, onward)    Start     Dose/Rate Route Frequency Ordered Stop   07/02/23 2300  cefTRIAXone (ROCEPHIN) 2 g in sodium chloride 0.9 % 100 mL IVPB        2 g 200 mL/hr over 30 Minutes Intravenous Every 24 hours 07/02/23 1842 07/09/23 2259   07/02/23 1630  ceFEPIme (MAXIPIME) 2 g in sodium chloride 0.9 % 100 mL IVPB        2 g 200 mL/hr over 30 Minutes Intravenous  Once 07/02/23 1615 07/02/23 1709   07/02/23 1630  metroNIDAZOLE (FLAGYL) IVPB 500 mg        500 mg 100 mL/hr over 60 Minutes Intravenous  Once 07/02/23 1615 07/02/23 1814   07/02/23 1630  vancomycin (VANCOCIN) IVPB 1000 mg/200 mL premix  Status:  Discontinued        1,000 mg 200 mL/hr over 60 Minutes Intravenous  Once 07/02/23 1615 07/02/23 1616   07/02/23 1630  vancomycin (VANCOREADY) IVPB 2000 mg/400 mL        2,000 mg 200 mL/hr over 120 Minutes Intravenous  Once 07/02/23 1616 07/02/23 2034              Family Communication/Anticipated D/C date and plan/Code Status   DVT prophylaxis:      Code Status: Full Code  Family Communication: None Disposition Plan: Plan to discharge home when medically stable   Status is: Inpatient Remains inpatient appropriate because: Left lower extremity cellulitis on IV antibiotics       Subjective:   Interval events noted.  She complains of pain in the left leg.  No shortness of breath or chest pain.  Objective:    Vitals:   07/03/23 0400 07/03/23 0700 07/03/23 0800 07/03/23 0900  BP: 118/62 114/71 (!) 107/55 119/80  Pulse: 76 67 63 69  Resp: 17 15 15 15   Temp: 99.8 F (37.7 C)  97.9 F (36.6 C)   TempSrc: Oral  Oral   SpO2: 100% 99% 100% 100%  Weight:   (!) 174  kg   Height:       No data found.   Intake/Output Summary (Last 24 hours) at 07/03/2023 1217 Last data filed at 07/03/2023 0900 Gross per 24 hour  Intake 1803.08 ml  Output 1300 ml  Net 503.08 ml   Filed Weights   07/02/23 1509 07/02/23 2100 07/03/23 0800  Weight: (!) 181.4 kg 74 kg (!) 174 kg    Exam:  GEN: NAD SKIN: Warm and dry EYES: No pallor or icterus ENT: MMM, + tracheostomy CV: RRR PULM: CTA B ABD: soft, obese, NT, +BS CNS: AAO x 3, non focal EXT: Left leg and foot tenderness with mild swelling and erythema        Data Reviewed:   I have personally reviewed following labs and imaging studies:  Labs: Labs show the following:   Basic Metabolic Panel: Recent Labs  Lab 07/02/23 1536 07/03/23 0603  NA 135 138  K 4.4 3.3*  CL 98 100  CO2 24 28  GLUCOSE 109* 119*  BUN 20 17  CREATININE 1.31* 1.10*  CALCIUM 9.3 8.6*   GFR Estimated Creatinine Clearance: 99.5 mL/min (A) (by C-G formula based on SCr of 1.1 mg/dL (H)). Liver Function Tests: Recent Labs  Lab 07/02/23 1536  AST 21  ALT 14  ALKPHOS 64  BILITOT 0.9  PROT 8.0  ALBUMIN 3.9   No results for input(s): "LIPASE", "AMYLASE" in the last 168 hours. No results for input(s): "AMMONIA" in the last 168 hours. Coagulation profile Recent Labs  Lab 07/02/23 1602  INR 1.1    CBC: Recent Labs  Lab 07/02/23 1536 07/03/23 0603  WBC 16.0* 11.5*  NEUTROABS 14.0*  --   HGB 11.1* 9.9*  HCT 33.6* 30.4*  MCV 80.8 81.9  PLT 180 187   Cardiac Enzymes: No results for input(s): "CKTOTAL", "CKMB", "CKMBINDEX", "TROPONINI" in the last 168 hours. BNP (last 3 results) No results for input(s): "PROBNP" in the last 8760 hours. CBG: No results for input(s): "GLUCAP" in the last 168 hours. D-Dimer: No results for input(s): "DDIMER" in the last 72 hours. Hgb A1c: No results for input(s): "HGBA1C" in the last 72 hours. Lipid Profile: No results for input(s): "CHOL", "HDL", "LDLCALC", "TRIG",  "CHOLHDL", "LDLDIRECT" in the last 72 hours. Thyroid function studies: No results for input(s): "TSH", "T4TOTAL", "T3FREE", "THYROIDAB" in the last 72 hours.  Invalid input(s): "FREET3" Anemia work up: No results for input(s): "VITAMINB12", "FOLATE", "FERRITIN", "TIBC", "IRON", "RETICCTPCT" in the last 72 hours. Sepsis Labs: Recent Labs  Lab 07/02/23 1536 07/02/23 1602 07/02/23 2049 07/03/23 4696  WBC 16.0*  --   --  11.5*  LATICACIDVEN  --  1.2 0.7  --     Microbiology Recent Results (from the past 240 hour(s))  Blood Culture (routine x 2)     Status: None (Preliminary result)   Collection Time: 07/02/23  4:02 PM   Specimen: BLOOD  Result Value Ref Range Status   Specimen Description BLOOD L SHOULDER  Final   Special Requests   Final    BOTTLES DRAWN AEROBIC AND ANAEROBIC Blood Culture results may not be optimal due to an inadequate volume of blood received in culture bottles   Culture   Final    NO GROWTH < 24 HOURS Performed at [facility], [address], [place], [place] [address]    Report Status PENDING  Incomplete  Blood Culture (routine x 2)     Status: None (Preliminary result)   Collection Time: 07/02/23  4:07 PM   Specimen: BLOOD  Result Value Ref Range Status   Specimen Description BLOOD LEFT ANTECUBITAL  Final   Special Requests   Final    BOTTLES DRAWN AEROBIC AND ANAEROBIC Blood Culture results may not be optimal due to an inadequate volume of blood received in culture bottles   Culture   Final    NO GROWTH < 24 HOURS Performed at [facility], [address] [place], [place] [address]    Report Status PENDING  Incomplete  Resp Panel by RT-PCR (Flu A&B, Covid) Anterior Nasal Swab     Status: None   Collection Time: 07/02/23  4:25 PM   Specimen: Anterior Nasal Swab  Result Value Ref Range Status   SARS Coronavirus 2 by RT PCR NEGATIVE NEGATIVE Final    Comment: (NOTE) SARS-CoV-2 target nucleic acids are NOT DETECTED.  The  SARS-CoV-2 RNA is generally detectable in upper respiratory specimens during the acute phase of infection. The lowest concentration of SARS-CoV-2 viral copies this assay can detect is 138 copies/mL. A negative result does not preclude SARS-Cov-2 infection and should not be used as the sole basis for treatment or other patient management decisions. A negative result may occur with  improper specimen collection/handling, submission of specimen other than nasopharyngeal swab, presence of viral mutation(s) within the areas targeted by this assay, and inadequate number of viral copies(<138 copies/mL). A negative result must be combined with clinical observations, patient history, and epidemiological information. The expected result is Negative.  Fact Sheet for Patients:  BloggerCourse.com  Fact Sheet for Healthcare Providers:  SeriousBroker.it  This test is no t yet approved or cleared by the [place] FDA and  has been authorized for detection and/or diagnosis of SARS-CoV-2 by FDA under an Emergency Use Authorization (EUA). This EUA will remain  in effect (meaning this test can be used) for the duration of the COVID-19 declaration under Section 564(b)(1) of the Act, 21 U.S.C.section 360bbb-3(b)(1), unless the authorization is terminated  or revoked sooner.       Influenza A by PCR NEGATIVE NEGATIVE Final   Influenza B by PCR NEGATIVE NEGATIVE Final    Comment: (NOTE) The Xpert Xpress SARS-CoV-2/FLU/RSV plus assay is intended as an aid in the diagnosis of influenza from Nasopharyngeal swab specimens and should not be used as a sole basis for treatment. Nasal washings and aspirates are unacceptable for Xpert Xpress SARS-CoV-2/FLU/RSV testing.  Fact Sheet for Patients: BloggerCourse.com  Fact Sheet for Healthcare Providers: SeriousBroker.it  This test is not yet approved or  cleared by the [place] FDA  and has been authorized for detection and/or diagnosis of SARS-CoV-2 by FDA under an Emergency Use Authorization (EUA). This EUA will remain in effect (meaning this test can be used) for the duration of the COVID-19 declaration under Section 564(b)(1) of the Act, 21 U.S.C. section 360bbb-3(b)(1), unless the authorization is terminated or revoked.  Performed at [facility], [address] [place], [place] [address]   MRSA Next Gen by PCR, Nasal     Status: None   Collection Time: 07/02/23  8:29 PM   Specimen: Nasal Mucosa; Nasal Swab  Result Value Ref Range Status   MRSA by PCR Next Gen NOT DETECTED NOT DETECTED Final    Comment: (NOTE) The GeneXpert MRSA Assay (FDA approved for NASAL specimens only), is one component of a comprehensive MRSA colonization surveillance program. It is not intended to diagnose MRSA infection nor to guide or monitor treatment for MRSA infections. Test performance is not FDA approved in patients less than 30 years old. Performed at [facility], [address] [place], [place] [address]     Procedures and diagnostic studies:  US Venous Img Lower Unilateral Left  Result Date: 07/02/2023 CLINICAL DATA:  Edema.  Cellulitis.  Rule out DVT. EXAM: Left LOWER EXTREMITY VENOUS DOPPLER ULTRASOUND TECHNIQUE: Gray-scale sonography with compression, as well as color and duplex ultrasound, were performed to evaluate the deep venous system(s) from the level of the common femoral vein through the popliteal and proximal calf veins. COMPARISON:  None Available. FINDINGS: VENOUS Normal compressibility of the common femoral, superficial femoral, and popliteal veins, as well as the visualized calf veins. Visualized portions of profunda femoral vein and great saphenous vein unremarkable. No filling defects to suggest DVT on grayscale or color Doppler imaging. Doppler waveforms show normal direction of venous flow, normal  respiratory plasticity and response to augmentation. Limited views of the contralateral common femoral vein are unremarkable. OTHER None. Limitations: Exam detail is diminished secondary to large fatty habitus and poor patient mobility. IMPRESSION: No femoropopliteal DVT nor evidence of DVT within the visualized calf veins. Electronically Signed   By: Signa Kell M.D.   On: 07/02/2023 17:34   DG Tibia/Fibula Left  Result Date: 07/02/2023 CLINICAL DATA:  Pain and swelling EXAM: LEFT TIBIA AND FIBULA - 2 VIEW COMPARISON:  None Available. FINDINGS: No recent fracture or dislocation is seen. There are no focal lytic lesions. Degenerative changes are noted with bony spurs in medial, lateral and patellofemoral compartments in the left knee. There is narrowing of joint space in the medial compartment. Plantar spur is seen in calcaneus. There is edema in subcutaneous plane. IMPRESSION: No fracture or dislocation is seen in left tibia and fibula. No focal lytic lesions are seen. Degenerative changes are noted in left knee. Electronically Signed   By: Ernie Avena M.D.   On: 07/02/2023 16:54   DG Chest Port 1 View  Result Date: 07/02/2023 CLINICAL DATA:  Edema in the lower extremities EXAM: PORTABLE CHEST 1 VIEW COMPARISON:  11/03/2022 FINDINGS: Transverse diameter of heart is increased. Central pulmonary vessels are prominent suggesting CHF. There are no focal pulmonary infiltrates. Lateral CP angles are indistinct. There is no pneumothorax. Tip of the tracheostomy is 5 cm above the carina. IMPRESSION: Technically limited study. Cardiomegaly. Central pulmonary vessels are prominent suggesting CHF. Possible small bilateral pleural effusions. Electronically Signed   By: Ernie Avena M.D.   On: 07/02/2023 16:52   DG Foot Complete Left  Result Date: 07/02/2023 CLINICAL DATA:  Pain and swelling  EXAM: LEFT FOOT - COMPLETE 3+ VIEW COMPARISON:  None Available. FINDINGS: Examination is technically limited  due to less than optimal positioning and marked soft tissue swelling. As far as seen, no displaced fracture or dislocation is seen. There is marked soft tissue swelling over the dorsum. Plantar spur is seen in calcaneus. Small bony spurs are noted in the intertarsal and tarsometatarsal joints in the lateral view. IMPRESSION: Technically limited study. No displaced fracture or dislocation is seen. Degenerative changes are noted in multiple joints. There is marked soft tissue swelling over the dorsum. No opaque foreign bodies are seen. Electronically Signed   By: Ernie Avena M.D.   On: 07/02/2023 16:50               LOS: 1 day      [facility]   Pager on [email or web address]. If 7PM-7AM, please contact night-coverage at [email or web address]     07/03/2023, 12:17 PM

## 2023-07-03 NOTE — Plan of Care (Deleted)
  Problem: Education: Goal: Knowledge of General Education information will improve Description: Including pain rating scale, medication(s)/side effects and non-pharmacologic comfort measures Outcome: Progressing   Problem: Health Behavior/Discharge Planning: Goal: Ability to manage health-related needs will improve Outcome: Progressing   Problem: Clinical Measurements: Goal: Ability to maintain clinical measurements within normal limits will improve Outcome: Progressing Goal: Will remain free from infection Outcome: Progressing Goal: Respiratory complications will improve Outcome: Progressing Goal: Cardiovascular complication will be avoided Outcome: Progressing   Problem: Activity: Goal: Risk for activity intolerance will decrease Outcome: Progressing   Problem: Coping: Goal: Level of anxiety will decrease Outcome: Progressing   Problem: Pain Managment: Goal: General experience of comfort will improve Outcome: Progressing   Problem: Safety: Goal: Ability to remain free from injury will improve Outcome: Progressing   Problem: Skin Integrity: Goal: Risk for impaired skin integrity will decrease Outcome: Progressing

## 2023-07-03 NOTE — Progress Notes (Signed)
IV consult placed for IV start around 7pm today. Spoke with primary RN which states pt has an IV and is only receiving IV antibiotics at this time. Pt has adequate IV access for meds ordered. If any new meds/ivfs are needed, RN to place a new consult.

## 2023-07-03 NOTE — Progress Notes (Signed)
Patient alert and oriented x4, stable pressures, afebrile, on trach collar. Patient ambulated bed to chair, 1-2 assist. Patient given medications PRN per orders. Patient stand/pivot to BSC/Chair from bed. 1-2 assist. Tolerates well. Patient educated on hospital food policy, patient made aware outside food is not allowed in ICU. Patient acknowledged understanding of policy.Will continue to monitor.

## 2023-07-04 DIAGNOSIS — L03116 Cellulitis of left lower limb: Secondary | ICD-10-CM | POA: Diagnosis not present

## 2023-07-04 DIAGNOSIS — A419 Sepsis, unspecified organism: Secondary | ICD-10-CM | POA: Diagnosis not present

## 2023-07-04 LAB — GLUCOSE, CAPILLARY: Glucose-Capillary: 131 mg/dL — ABNORMAL HIGH (ref 70–99)

## 2023-07-04 MED ORDER — FUROSEMIDE 20 MG PO TABS
40.0000 mg | ORAL_TABLET | Freq: Every day | ORAL | Status: DC
  Administered 2023-07-04 – 2023-07-06 (×3): 40 mg via ORAL
  Filled 2023-07-04 (×3): qty 2

## 2023-07-04 MED ORDER — LIDOCAINE 5 % EX PTCH
1.0000 | MEDICATED_PATCH | CUTANEOUS | Status: DC
  Administered 2023-07-04 – 2023-07-06 (×3): 1 via TRANSDERMAL
  Filled 2023-07-04 (×3): qty 1

## 2023-07-04 MED ORDER — CEFAZOLIN SODIUM-DEXTROSE 2-4 GM/100ML-% IV SOLN
2.0000 g | Freq: Three times a day (TID) | INTRAVENOUS | Status: DC
  Administered 2023-07-04 – 2023-07-06 (×5): 2 g via INTRAVENOUS
  Filled 2023-07-04 (×6): qty 100

## 2023-07-04 MED ORDER — OXYCODONE-ACETAMINOPHEN 5-325 MG PO TABS
2.0000 | ORAL_TABLET | ORAL | Status: DC | PRN
  Administered 2023-07-04 – 2023-07-06 (×11): 2 via ORAL
  Filled 2023-07-04 (×11): qty 2

## 2023-07-04 MED ORDER — IBUPROFEN 400 MG PO TABS
600.0000 mg | ORAL_TABLET | Freq: Four times a day (QID) | ORAL | Status: DC | PRN
  Administered 2023-07-04: 600 mg via ORAL
  Filled 2023-07-04: qty 2

## 2023-07-04 NOTE — Plan of Care (Signed)
  Problem: Education: Goal: Knowledge of General Education information will improve Description Including pain rating scale, medication(s)/side effects and non-pharmacologic comfort measures Outcome: Progressing   Problem: Health Behavior/Discharge Planning: Goal: Ability to manage health-related needs will improve Outcome: Progressing   

## 2023-07-04 NOTE — Progress Notes (Signed)
Progress Note    Quintara Juergensen  ZOX:[id number] DOB: [birth year]  DOA: 07/02/2023 PCP: Dairl Ponder, MD      Brief Narrative:    Medical records reviewed and are as summarized below:  Unborn Carreon is a 47 y.o. female with medical history significant of obesity hypoventilation syndrome with tracheostomy and nighttime ventilator dependence, CKD stage IIIa, hypertension, prediabetes, bipolar disorder, anxiety/depression, who presented to the hospital because of left foot pain and fever. She was tachypneic, tachycardic and febrile in the ED with Tmax of 103.2 F.  She also had leukocytosis with WBC of 16,000.  She was admitted to the hospital for sepsis secondary to left leg and foot cellulitis.       Assessment/Plan:   Principal Problem:   Sepsis (HCC) Active Problems:   Obesity hypoventilation syndrome (HCC)   Cellulitis of left foot   Chronic heart failure with preserved ejection fraction (HCC)   Essential hypertension   Morbid obesity with BMI of 70 and over, adult (HCC)   Stage III chronic kidney disease (HCC)    Body mass index is 70.16 kg/m.  (Morbid obesity)    Sepsis secondary to left lower extremity nonpurulent cellulitis: Leukocytosis has improved.  Change IV ceftriaxone to IV cefazolin.  No growth on blood cultures thus far. Add ibuprofen for pain control.  Increase Percocet from 7.5 to 10 mg every 4 hours as needed.  Continue IV Dilaudid as needed for pain.   Hypokalemia: Improved   Chronic diastolic CHF: Compensated.  Restart Lasix.  EF 55 to 60% on 2D echo in November 2023.   Chronic hypoxic respiratory failure, obese hypoventilation syndrome: She uses oxygen via trach collar during the daytime and uses a ventilator at night.   General weakness: PT evaluation   Other comorbidities include CKD stage IIIa, hypertension, morbid obesity, chronic back pain      Diet Order             Diet Heart Room service appropriate? Yes; Fluid  consistency: Thin  Diet effective now                            Consultants: None  Procedures: None    Medications:    Chlorhexidine Gluconate Cloth  6 each Topical Q0600   cyclobenzaprine  5 mg Oral QHS   enoxaparin (LOVENOX) injection  0.5 mg/kg Subcutaneous Q24H   furosemide  40 mg Oral Daily   lidocaine  1 patch Transdermal Q24H   mupirocin ointment  1 Application Nasal BID   sodium chloride flush  3 mL Intravenous Q12H   Continuous Infusions:   ceFAZolin (ANCEF) IV       Anti-infectives (From admission, onward)    Start     Dose/Rate Route Frequency Ordered Stop   07/04/23 2200  ceFAZolin (ANCEF) IVPB 2g/100 mL premix        2 g 200 mL/hr over 30 Minutes Intravenous Every 8 hours 07/04/23 1149 07/08/23 2159   07/02/23 2300  cefTRIAXone (ROCEPHIN) 2 g in sodium chloride 0.9 % 100 mL IVPB  Status:  Discontinued        2 g 200 mL/hr over 30 Minutes Intravenous Every 24 hours 07/02/23 1842 07/04/23 1148   07/02/23 1630  ceFEPIme (MAXIPIME) 2 g in sodium chloride 0.9 % 100 mL IVPB        2 g 200 mL/hr over 30 Minutes Intravenous  Once 07/02/23 1615 07/02/23 1709  07/02/23 1630  metroNIDAZOLE (FLAGYL) IVPB 500 mg        500 mg 100 mL/hr over 60 Minutes Intravenous  Once 07/02/23 1615 07/02/23 1814   07/02/23 1630  vancomycin (VANCOCIN) IVPB 1000 mg/200 mL premix  Status:  Discontinued        1,000 mg 200 mL/hr over 60 Minutes Intravenous  Once 07/02/23 1615 07/02/23 1616   07/02/23 1630  vancomycin (VANCOREADY) IVPB 2000 mg/400 mL        2,000 mg 200 mL/hr over 120 Minutes Intravenous  Once 07/02/23 1616 07/02/23 2034              Family Communication/Anticipated D/C date and plan/Code Status   DVT prophylaxis:      Code Status: Full Code  Family Communication: None Disposition Plan: Plan to discharge home when medically stable   Status is: Inpatient Remains inpatient appropriate because: Left lower extremity cellulitis on IV  antibiotics       Subjective:   She complains of pain in the left foot.  She said the pain medicine is not adequate.  He also states she takes 10 mg of Percocet at home and not 7.5 mg.  Objective:    Vitals:   07/04/23 0100 07/04/23 0200 07/04/23 0800 07/04/23 1000  BP: 118/67 (!) 92/48 116/71 (!) 101/52  Pulse: 65 70 66   Resp: 16 15 18    Temp:      TempSrc:      SpO2: 98% 97% 95%   Weight:      Height:       No data found.   Intake/Output Summary (Last 24 hours) at 07/04/2023 1302 Last data filed at 07/04/2023 0800 Gross per 24 hour  Intake 1400 ml  Output 500 ml  Net 900 ml   Filed Weights   07/02/23 1509 07/02/23 2100 07/03/23 0800  Weight: (!) 181.4 kg 74 kg (!) 174 kg    Exam:   GEN: NAD SKIN: Warm and dry EYES: EOMI ENT: MMM, tracheostomy CV: RRR PULM: CTA B ABD: soft, obese, NT, +BS CNS: AAO x 3, non focal EXT: Left leg and foot tenderness with mild swelling   Data Reviewed:   I have personally reviewed following labs and imaging studies:  Labs: Labs show the following:   Basic Metabolic Panel: Recent Labs  Lab 07/02/23 1536 07/03/23 0603 07/04/23 0325  NA 135 138 138  K 4.4 3.3* 3.7  CL 98 100 103  CO2 24 28 27   GLUCOSE 109* 119* 106*  BUN 20 17 19   CREATININE 1.31* 1.10* 1.26*  CALCIUM 9.3 8.6* 8.6*   GFR Estimated Creatinine Clearance: 86.9 mL/min (A) (by C-G formula based on SCr of 1.26 mg/dL (H)). Liver Function Tests: Recent Labs  Lab 07/02/23 1536  AST 21  ALT 14  ALKPHOS 64  BILITOT 0.9  PROT 8.0  ALBUMIN 3.9   No results for input(s): "LIPASE", "AMYLASE" in the last 168 hours. No results for input(s): "AMMONIA" in the last 168 hours. Coagulation profile Recent Labs  Lab 07/02/23 1602  INR 1.1    CBC: Recent Labs  Lab 07/02/23 1536 07/03/23 0603 07/04/23 0325  WBC 16.0* 11.5* 9.8  NEUTROABS 14.0*  --  6.6  HGB 11.1* 9.9* 9.3*  HCT 33.6* 30.4* 29.8*  MCV 80.8 81.9 85.6  PLT 180 187 192   Cardiac  Enzymes: No results for input(s): "CKTOTAL", "CKMB", "CKMBINDEX", "TROPONINI" in the last 168 hours. BNP (last 3 results) No results for input(s): "PROBNP" in  the last 8760 hours. CBG: Recent Labs  Lab 07/02/23 1957  GLUCAP 131*   D-Dimer: No results for input(s): "DDIMER" in the last 72 hours. Hgb A1c: No results for input(s): "HGBA1C" in the last 72 hours. Lipid Profile: No results for input(s): "CHOL", "HDL", "LDLCALC", "TRIG", "CHOLHDL", "LDLDIRECT" in the last 72 hours. Thyroid function studies: No results for input(s): "TSH", "T4TOTAL", "T3FREE", "THYROIDAB" in the last 72 hours.  Invalid input(s): "FREET3" Anemia work up: No results for input(s): "VITAMINB12", "FOLATE", "FERRITIN", "TIBC", "IRON", "RETICCTPCT" in the last 72 hours. Sepsis Labs: Recent Labs  Lab 07/02/23 1536 07/02/23 1602 07/02/23 2049 07/03/23 0603 07/04/23 0325  WBC 16.0*  --   --  11.5* 9.8  LATICACIDVEN  --  1.2 0.7  --   --     Microbiology Recent Results (from the past 240 hour(s))  Blood Culture (routine x 2)     Status: None (Preliminary result)   Collection Time: 07/02/23  4:02 PM   Specimen: BLOOD  Result Value Ref Range Status   Specimen Description BLOOD L SHOULDER  Final   Special Requests   Final    BOTTLES DRAWN AEROBIC AND ANAEROBIC Blood Culture results may not be optimal due to an inadequate volume of blood received in culture bottles   Culture   Final    NO GROWTH 2 DAYS Performed at [facility], [address], [place], [place] [address]    Report Status PENDING  Incomplete  Blood Culture (routine x 2)     Status: None (Preliminary result)   Collection Time: 07/02/23  4:07 PM   Specimen: BLOOD  Result Value Ref Range Status   Specimen Description BLOOD LEFT ANTECUBITAL  Final   Special Requests   Final    BOTTLES DRAWN AEROBIC AND ANAEROBIC Blood Culture results may not be optimal due to an inadequate volume of blood received in culture bottles   Culture    Final    NO GROWTH 2 DAYS Performed at [facility], [address], [place], [place] [address]    Report Status PENDING  Incomplete  Resp Panel by RT-PCR (Flu A&B, Covid) Anterior Nasal Swab     Status: None   Collection Time: 07/02/23  4:25 PM   Specimen: Anterior Nasal Swab  Result Value Ref Range Status   SARS Coronavirus 2 by RT PCR NEGATIVE NEGATIVE Final    Comment: (NOTE) SARS-CoV-2 target nucleic acids are NOT DETECTED.  The SARS-CoV-2 RNA is generally detectable in upper respiratory specimens during the acute phase of infection. The lowest concentration of SARS-CoV-2 viral copies this assay can detect is 138 copies/mL. A negative result does not preclude SARS-Cov-2 infection and should not be used as the sole basis for treatment or other patient management decisions. A negative result may occur with  improper specimen collection/handling, submission of specimen other than nasopharyngeal swab, presence of viral mutation(s) within the areas targeted by this assay, and inadequate number of viral copies(<138 copies/mL). A negative result must be combined with clinical observations, patient history, and epidemiological information. The expected result is Negative.  Fact Sheet for Patients:  BloggerCourse.com  Fact Sheet for Healthcare Providers:  SeriousBroker.it  This test is no t yet approved or cleared by the [place] FDA and  has been authorized for detection and/or diagnosis of SARS-CoV-2 by FDA under an Emergency Use Authorization (EUA). This EUA will remain  in effect (meaning this test can be used) for the duration of the COVID-19 declaration under Section  564(b)(1) of the Act, 21 U.S.C.section 360bbb-3(b)(1), unless the authorization is terminated  or revoked sooner.       Influenza A by PCR NEGATIVE NEGATIVE Final   Influenza B by PCR NEGATIVE NEGATIVE Final    Comment: (NOTE) The Xpert Xpress  SARS-CoV-2/FLU/RSV plus assay is intended as an aid in the diagnosis of influenza from Nasopharyngeal swab specimens and should not be used as a sole basis for treatment. Nasal washings and aspirates are unacceptable for Xpert Xpress SARS-CoV-2/FLU/RSV testing.  Fact Sheet for Patients: BloggerCourse.com  Fact Sheet for Healthcare Providers: SeriousBroker.it  This test is not yet approved or cleared by the [place] FDA and has been authorized for detection and/or diagnosis of SARS-CoV-2 by FDA under an Emergency Use Authorization (EUA). This EUA will remain in effect (meaning this test can be used) for the duration of the COVID-19 declaration under Section 564(b)(1) of the Act, 21 U.S.C. section 360bbb-3(b)(1), unless the authorization is terminated or revoked.  Performed at [facility], [address] [place], [place] [address]   MRSA Next Gen by PCR, Nasal     Status: None   Collection Time: 07/02/23  8:29 PM   Specimen: Nasal Mucosa; Nasal Swab  Result Value Ref Range Status   MRSA by PCR Next Gen NOT DETECTED NOT DETECTED Final    Comment: (NOTE) The GeneXpert MRSA Assay (FDA approved for NASAL specimens only), is one component of a comprehensive MRSA colonization surveillance program. It is not intended to diagnose MRSA infection nor to guide or monitor treatment for MRSA infections. Test performance is not FDA approved in patients less than 28 years old. Performed at [facility], [address] [place], [place] [address]     Procedures and diagnostic studies:  US Venous Img Lower Unilateral Left  Result Date: 07/02/2023 CLINICAL DATA:  Edema.  Cellulitis.  Rule out DVT. EXAM: Left LOWER EXTREMITY VENOUS DOPPLER ULTRASOUND TECHNIQUE: Gray-scale sonography with compression, as well as color and duplex ultrasound, were performed to evaluate the deep venous system(s) from the level of the common  femoral vein through the popliteal and proximal calf veins. COMPARISON:  None Available. FINDINGS: VENOUS Normal compressibility of the common femoral, superficial femoral, and popliteal veins, as well as the visualized calf veins. Visualized portions of profunda femoral vein and great saphenous vein unremarkable. No filling defects to suggest DVT on grayscale or color Doppler imaging. Doppler waveforms show normal direction of venous flow, normal respiratory plasticity and response to augmentation. Limited views of the contralateral common femoral vein are unremarkable. OTHER None. Limitations: Exam detail is diminished secondary to large fatty habitus and poor patient mobility. IMPRESSION: No femoropopliteal DVT nor evidence of DVT within the visualized calf veins. Electronically Signed   By: Signa Kell M.D.   On: 07/02/2023 17:34   DG Tibia/Fibula Left  Result Date: 07/02/2023 CLINICAL DATA:  Pain and swelling EXAM: LEFT TIBIA AND FIBULA - 2 VIEW COMPARISON:  None Available. FINDINGS: No recent fracture or dislocation is seen. There are no focal lytic lesions. Degenerative changes are noted with bony spurs in medial, lateral and patellofemoral compartments in the left knee. There is narrowing of joint space in the medial compartment. Plantar spur is seen in calcaneus. There is edema in subcutaneous plane. IMPRESSION: No fracture or dislocation is seen in left tibia and fibula. No focal lytic lesions are seen. Degenerative changes are noted in left knee. Electronically Signed   By: Ernie Avena M.D.   On: 07/02/2023 16:54  DG Chest Port 1 View  Result Date: 07/02/2023 CLINICAL DATA:  Edema in the lower extremities EXAM: PORTABLE CHEST 1 VIEW COMPARISON:  11/03/2022 FINDINGS: Transverse diameter of heart is increased. Central pulmonary vessels are prominent suggesting CHF. There are no focal pulmonary infiltrates. Lateral CP angles are indistinct. There is no pneumothorax. Tip of the tracheostomy  is 5 cm above the carina. IMPRESSION: Technically limited study. Cardiomegaly. Central pulmonary vessels are prominent suggesting CHF. Possible small bilateral pleural effusions. Electronically Signed   By: Ernie Avena M.D.   On: 07/02/2023 16:52   DG Foot Complete Left  Result Date: 07/02/2023 CLINICAL DATA:  Pain and swelling EXAM: LEFT FOOT - COMPLETE 3+ VIEW COMPARISON:  None Available. FINDINGS: Examination is technically limited due to less than optimal positioning and marked soft tissue swelling. As far as seen, no displaced fracture or dislocation is seen. There is marked soft tissue swelling over the dorsum. Plantar spur is seen in calcaneus. Small bony spurs are noted in the intertarsal and tarsometatarsal joints in the lateral view. IMPRESSION: Technically limited study. No displaced fracture or dislocation is seen. Degenerative changes are noted in multiple joints. There is marked soft tissue swelling over the dorsum. No opaque foreign bodies are seen. Electronically Signed   By: Ernie Avena M.D.   On: 07/02/2023 16:50               LOS: 2 days      [facility]   Pager on [email or web address]. If 7PM-7AM, please contact night-coverage at [email or web address]     07/04/2023, 1:02 PM

## 2023-07-04 NOTE — Progress Notes (Signed)
Patient A/O x4, NSR, stable pressures, on trach collar. PRN medications given per orders.   Patient education provided on fall risk and patient safety. Patient reminded not to ambulate without RN present. Patient states understanding.   Patient refused bath, states daughter will come bathe her.   Patient on [facility], refused to transfer to chair/bed. Education provided regarding safety and skin injuries.  Patient transferred with NT to chair to eat dinner.

## 2023-07-05 ENCOUNTER — Encounter: Payer: Self-pay | Admitting: Internal Medicine

## 2023-07-05 DIAGNOSIS — A419 Sepsis, unspecified organism: Secondary | ICD-10-CM | POA: Diagnosis not present

## 2023-07-05 LAB — CBC WITH DIFFERENTIAL/PLATELET
Abs Immature Granulocytes: 0.02 10*3/uL (ref 0.00–0.07)
Basophils Absolute: 0 10*3/uL (ref 0.0–0.1)
Basophils Relative: 1 %
Eosinophils Absolute: 0.1 10*3/uL (ref 0.0–0.5)
Eosinophils Relative: 1 %
HCT: 28.5 % — ABNORMAL LOW (ref 36.0–46.0)
Hemoglobin: 9 g/dL — ABNORMAL LOW (ref 12.0–15.0)
Immature Granulocytes: 0 %
Lymphocytes Relative: 24 %
Lymphs Abs: 2 10*3/uL (ref 0.7–4.0)
MCH: 26.3 pg (ref 26.0–34.0)
MCHC: 31.6 g/dL (ref 30.0–36.0)
MCV: 83.3 fL (ref 80.0–100.0)
Monocytes Absolute: 0.7 10*3/uL (ref 0.1–1.0)
Monocytes Relative: 8 %
Neutro Abs: 5.4 10*3/uL (ref 1.7–7.7)
Neutrophils Relative %: 66 %
Platelets: 199 10*3/uL (ref 150–400)
RBC: 3.42 MIL/uL — ABNORMAL LOW (ref 3.87–5.11)
RDW: 16.9 % — ABNORMAL HIGH (ref 11.5–15.5)
WBC: 8.2 10*3/uL (ref 4.0–10.5)
nRBC: 0 % (ref 0.0–0.2)

## 2023-07-05 LAB — BASIC METABOLIC PANEL
Anion gap: 10 (ref 5–15)
BUN: 17 mg/dL (ref 6–20)
CO2: 28 mmol/L (ref 22–32)
Calcium: 8.6 mg/dL — ABNORMAL LOW (ref 8.9–10.3)
Chloride: 98 mmol/L (ref 98–111)
Creatinine, Ser: 1.13 mg/dL — ABNORMAL HIGH (ref 0.44–1.00)
GFR, Estimated: >60 mL/min (ref >60)
Glucose, Bld: 104 mg/dL — ABNORMAL HIGH (ref 70–99)
Potassium: 3.8 mmol/L (ref 3.5–5.1)
Sodium: 136 mmol/L (ref 135–145)

## 2023-07-05 MED ORDER — DULOXETINE HCL 30 MG PO CPEP
30.0000 mg | ORAL_CAPSULE | Freq: Every day | ORAL | Status: DC
  Administered 2023-07-05: 30 mg via ORAL
  Filled 2023-07-05: qty 1

## 2023-07-05 MED ORDER — PREGABALIN 25 MG PO CAPS
25.0000 mg | ORAL_CAPSULE | Freq: Two times a day (BID) | ORAL | Status: DC
  Administered 2023-07-05 – 2023-07-06 (×2): 25 mg via ORAL
  Filled 2023-07-05 (×2): qty 1

## 2023-07-05 MED ORDER — KETOROLAC TROMETHAMINE 15 MG/ML IJ SOLN
15.0000 mg | Freq: Four times a day (QID) | INTRAMUSCULAR | Status: DC
  Administered 2023-07-05 – 2023-07-06 (×4): 15 mg via INTRAVENOUS
  Filled 2023-07-05 (×4): qty 1

## 2023-07-05 NOTE — Plan of Care (Signed)

## 2023-07-05 NOTE — Plan of Care (Signed)

## 2023-07-05 NOTE — Evaluation (Addendum)
Physical Therapy Evaluation Patient Details Name: Sandra Holloway MRN: [id number] DOB: [birth year] Today's Date: 07/05/2023  History of Present Illness  Patient is a 47 year old female admitted with sepsis secondary to left leg and foot cellulitis.  History of history significant for obesity hypoventilation syndrome with tracheostomy and nighttime ventilator dependence, CKD stage IIIa, hypertension, prediabetes, bipolar disorder, anxiety/depression.    Clinical Impression  Patient is agreeable to PT evaluation. She was seated on the bed side commode. She reports she ambulates very short distance using a rollator at baseline. She has 24 hour aides and family that can assist her at home. She lives in an apartment.  The patient was able to reposition independently on the bed side commode. She reports L foot pain has improved significantly since arrival to the hospital. She declined to stand or ambulate at this time. She is interested in weight loss and would benefit from bed side commode at home. PT will continue to follow to maximize independence and facilitate return to prior level of function.       If plan is discharge home, recommend the following: A little help with walking and/or transfers;A little help with bathing/dressing/bathroom;Assist for transportation;Assistance with cooking/housework   Can travel by private vehicle        Equipment Recommendations BSC/3in1  Recommendations for Other Services       Functional Status Assessment Patient has had a recent decline in their functional status and demonstrates the ability to make significant improvements in function in a reasonable and predictable amount of time.     Precautions / Restrictions Precautions Precautions: Fall Restrictions Weight Bearing Restrictions: No      Mobility  Bed Mobility               General bed mobility comments: not addressed as patient sitting up on bed side commode on arrival to room     Transfers                   General transfer comment: patient can reposition independently on bed side commode for comfort and pressure relief. she refused to stand due to fatigue and just getting to bed side commode (she requests to sit up for prolonged periods on the bed side commode due to frequency of urination)    Ambulation/Gait                  Stairs            Wheelchair Mobility     Tilt Bed    Modified Rankin (Stroke Patients Only)       Balance Overall balance assessment: Needs assistance Sitting-balance support: Feet unsupported, No upper extremity supported Sitting balance-Leahy Scale: Good                                       Pertinent Vitals/Pain Pain Assessment Pain Assessment: Faces Pain Score: 2  Pain Location: low back and L foot Pain Descriptors / Indicators: Discomfort Pain Intervention(s): Limited activity within patient's tolerance    Home Living Family/patient expects to be discharged to:: Private residence Living Arrangements: Children Available Help at Discharge: Family;Home health;Available 24 hours/day (24 hours of nursing care per patient report) Type of Home: Apartment Home Access: Level entry       Home Layout: One level Home Equipment: Rollator (4 wheels) Additional Comments: 24 hour aides at home    Prior Function  Prior Level of Function : Needs assist             Mobility Comments: short distance ambulation with rollator. she reports R knee gives out with standing and she is unable to stand for long periods of time ADLs Comments: performs bathing while seated on edge of tub. has 24 hour aide that can assist with ADLs and household chores as needed. family also helps     Hand Dominance        Extremity/Trunk Assessment   Upper Extremity Assessment Upper Extremity Assessment: Overall WFL for tasks assessed    Lower Extremity Assessment Lower Extremity Assessment: Generalized  weakness (edema noted in L foot. chronic bilateral foot numbness reported. endurance impaired for sustained activity in standing)       Communication   Communication: Tracheostomy (mouthing words)  Cognition Arousal/Alertness: Awake/alert Behavior During Therapy: WFL for tasks assessed/performed Overall Cognitive Status: Within Functional Limits for tasks assessed                                          General Comments General comments (skin integrity, edema, etc.): encouraged patient to elevated LE for edema management. she was able to prop the left foot up on a chair while seated on bed side commode    Exercises     Assessment/Plan    PT Assessment Patient needs continued PT services  PT Problem List Decreased strength;Decreased range of motion;Decreased activity tolerance;Decreased mobility;Obesity       PT Treatment Interventions DME instruction;Gait training;Functional mobility training;Therapeutic activities;Therapeutic exercise;Patient/family education    PT Goals (Current goals can be found in the Care Plan section)  Acute Rehab PT Goals Patient Stated Goal: to lose weight, get PT at discharge, get a bed side commode PT Goal Formulation: With patient Time For Goal Achievement: 07/19/23 Potential to Achieve Goals: Fair    Frequency Min 1X/week     Co-evaluation               AM-PAC PT "6 Clicks" Mobility  Outcome Measure Help needed turning from your back to your side while in a flat bed without using bedrails?: None Help needed moving from lying on your back to sitting on the side of a flat bed without using bedrails?: A Little Help needed moving to and from a bed to a chair (including a wheelchair)?: A Little Help needed standing up from a chair using your arms (e.g., wheelchair or bedside chair)?: A Little Help needed to walk in hospital room?: A Little Help needed climbing 3-5 steps with a railing? : A Little 6 Click Score: 19     End of Session Equipment Utilized During Treatment:  (trach oxygen) Activity Tolerance: Patient limited by fatigue Patient left:  (seated on bed side commode) Nurse Communication: Mobility status PT Visit Diagnosis: Difficulty in walking, not elsewhere classified (R26.2);Muscle weakness (generalized) (M62.81)    Time: 1015-1030 PT Time Calculation (min) (ACUTE ONLY): 15 min   Charges:   PT Evaluation $PT Eval Low Complexity: 1 Low   PT General Charges $$ ACUTE PT VISIT: 1 Visit        Donna Bernard, PT, MPT   Ina Homes 07/05/2023, 12:55 PM

## 2023-07-05 NOTE — Progress Notes (Signed)
PROGRESS NOTE    Sandra Holloway  WUJ:[id number] DOB: [birth year] DOA: 07/02/2023 PCP: Dairl Ponder, MD    Brief Narrative:   Sandra Holloway is a 47 y.o. female with medical history significant of obesity hypoventilation syndrome with tracheostomy and nighttime ventilator dependence, CKD stage IIIa, hypertension, prediabetes, bipolar disorder, anxiety/depression, who presented to the hospital because of left foot pain and fever. She was tachypneic, tachycardic and febrile in the ED with Tmax of 103.2 F.  She also had leukocytosis with WBC of 16,000.  She was admitted to the hospital for sepsis secondary to left leg and foot cellulitis.  Assessment & Plan:   Principal Problem:   Sepsis (HCC) Active Problems:   Obesity hypoventilation syndrome (HCC)   Cellulitis of left foot   Chronic heart failure with preserved ejection fraction (HCC)   Essential hypertension   Morbid obesity with BMI of 70 and over, adult (HCC)   Stage III chronic kidney disease (HCC)  Sepsis secondary to left lower extremity nonpurulent cellulitis:  Leukocytosis has improved.   Cultures remain with no growth Still with uncontrolled pain Plan: Continue cefazolin Multimodal pain control Minimize use of IV narcotic Monitor vitals and fever curve     Hypokalemia: Improved     Chronic diastolic CHF:  Compensated.  Restart Lasix.   EF 55 to 60% on 2D echo in November 2023.     Chronic hypoxic respiratory failure, obese hypoventilation syndrome: She uses oxygen via trach collar during the daytime and uses a ventilator at night.  Will need to remain physically in ICU.  Vital stable     General weakness: PT evaluation     Other comorbidities include CKD stage IIIa, hypertension, morbid obesity, chronic back pain      DVT prophylaxis: SQ Lovenox Code Status: Full Family Communication: None today Disposition Plan: Status is: Inpatient Remains inpatient appropriate because: Cellulitis on IV  antibiotics   Level of care: Stepdown  Consultants:  None  Procedures:  None  Antimicrobials: Cefazolin   Subjective: Seen and.  Resting in bed.  Continues to endorse pain in left foot.  Asks when she is going home.  Objective: Vitals:   07/05/23 0700 07/05/23 0800 07/05/23 0900 07/05/23 1000  BP: 112/61 108/60 (!) 103/47 (!) 148/96  Pulse: 69 67 67 78  Resp: 17 16 14 18   Temp:  98.3 F (36.8 C)    TempSrc:  Oral    SpO2: 100% 99% 100% 100%  Weight:      Height:        Intake/Output Summary (Last 24 hours) at 07/05/2023 1157 Last data filed at 07/05/2023 0800 Gross per 24 hour  Intake 820 ml  Output 2150 ml  Net -1330 ml   Filed Weights   07/02/23 1509 07/02/23 2100 07/03/23 0800  Weight: (!) 181.4 kg 74 kg (!) 174 kg    Examination:  General exam: NAD Respiratory system: Scattered crackles.  Normal work of breathing.  Trach collar Cardiovascular system: S1-S2, RRR, no murmurs, no pedal edema Gastrointestinal system: Obese, NT/ND, normal bowel sounds Central nervous system: Alert and oriented. No focal neurological deficits. Extremities: Decreased lower symmetrically Skin: Left heel ulcer.  Tender to touch.  Foot swollen Psychiatry: Judgement and insight appear normal. Mood & affect appropriate.     Data Reviewed: I have personally reviewed following labs and imaging studies  CBC: Recent Labs  Lab 07/02/23 1536 07/03/23 0603 07/04/23 0325 07/05/23 0928  WBC 16.0* 11.5* 9.8 8.2  NEUTROABS 14.0*  --  6.6 5.4  HGB 11.1* 9.9* 9.3* 9.0*  HCT 33.6* 30.4* 29.8* 28.5*  MCV 80.8 81.9 85.6 83.3  PLT 180 187 192 199   Basic Metabolic Panel: Recent Labs  Lab 07/02/23 1536 07/03/23 0603 07/04/23 0325 07/05/23 0928  NA 135 138 138 136  K 4.4 3.3* 3.7 3.8  CL 98 100 103 98  CO2 24 28 27 28   GLUCOSE 109* 119* 106* 104*  BUN 20 17 19 17   CREATININE 1.31* 1.10* 1.26* 1.13*  CALCIUM 9.3 8.6* 8.6* 8.6*   GFR: Estimated Creatinine Clearance: 96.9  mL/min (A) (by C-G formula based on SCr of 1.13 mg/dL (H)). Liver Function Tests: Recent Labs  Lab 07/02/23 1536  AST 21  ALT 14  ALKPHOS 64  BILITOT 0.9  PROT 8.0  ALBUMIN 3.9   No results for input(s): "LIPASE", "AMYLASE" in the last 168 hours. No results for input(s): "AMMONIA" in the last 168 hours. Coagulation Profile: Recent Labs  Lab 07/02/23 1602  INR 1.1   Cardiac Enzymes: No results for input(s): "CKTOTAL", "CKMB", "CKMBINDEX", "TROPONINI" in the last 168 hours. BNP (last 3 results) No results for input(s): "PROBNP" in the last 8760 hours. HbA1C: No results for input(s): "HGBA1C" in the last 72 hours. CBG: Recent Labs  Lab 07/02/23 1957  GLUCAP 131*   Lipid Profile: No results for input(s): "CHOL", "HDL", "LDLCALC", "TRIG", "CHOLHDL", "LDLDIRECT" in the last 72 hours. Thyroid Function Tests: No results for input(s): "TSH", "T4TOTAL", "FREET4", "T3FREE", "THYROIDAB" in the last 72 hours. Anemia Panel: No results for input(s): "VITAMINB12", "FOLATE", "FERRITIN", "TIBC", "IRON", "RETICCTPCT" in the last 72 hours. Sepsis Labs: Recent Labs  Lab 07/02/23 1602 07/02/23 2049  LATICACIDVEN 1.2 0.7    Recent Results (from the past 240 hour(s))  Blood Culture (routine x 2)     Status: None (Preliminary result)   Collection Time: 07/02/23  4:02 PM   Specimen: BLOOD  Result Value Ref Range Status   Specimen Description BLOOD L SHOULDER  Final   Special Requests   Final    BOTTLES DRAWN AEROBIC AND ANAEROBIC Blood Culture results may not be optimal due to an inadequate volume of blood received in culture bottles   Culture   Final    NO GROWTH 3 DAYS Performed at [facility], [address], [place], [place] [address]    Report Status PENDING  Incomplete  Blood Culture (routine x 2)     Status: None (Preliminary result)   Collection Time: 07/02/23  4:07 PM   Specimen: BLOOD  Result Value Ref Range Status   Specimen Description BLOOD LEFT  ANTECUBITAL  Final   Special Requests   Final    BOTTLES DRAWN AEROBIC AND ANAEROBIC Blood Culture results may not be optimal due to an inadequate volume of blood received in culture bottles   Culture   Final    NO GROWTH 3 DAYS Performed at [facility], [address], [place], [place] [address]    Report Status PENDING  Incomplete  Resp Panel by RT-PCR (Flu A&B, Covid) Anterior Nasal Swab     Status: None   Collection Time: 07/02/23  4:25 PM   Specimen: Anterior Nasal Swab  Result Value Ref Range Status   SARS Coronavirus 2 by RT PCR NEGATIVE NEGATIVE Final    Comment: (NOTE) SARS-CoV-2 target nucleic acids are NOT DETECTED.  The SARS-CoV-2 RNA is generally detectable in upper respiratory specimens during the acute phase of infection. The lowest concentration of SARS-CoV-2 viral copies  this assay can detect is 138 copies/mL. A negative result does not preclude SARS-Cov-2 infection and should not be used as the sole basis for treatment or other patient management decisions. A negative result may occur with  improper specimen collection/handling, submission of specimen other than nasopharyngeal swab, presence of viral mutation(s) within the areas targeted by this assay, and inadequate number of viral copies(<138 copies/mL). A negative result must be combined with clinical observations, patient history, and epidemiological information. The expected result is Negative.  Fact Sheet for Patients:  BloggerCourse.com  Fact Sheet for Healthcare Providers:  SeriousBroker.it  This test is no t yet approved or cleared by the [place] FDA and  has been authorized for detection and/or diagnosis of SARS-CoV-2 by FDA under an Emergency Use Authorization (EUA). This EUA will remain  in effect (meaning this test can be used) for the duration of the COVID-19 declaration under Section 564(b)(1) of the Act, 21 U.S.C.section  360bbb-3(b)(1), unless the authorization is terminated  or revoked sooner.       Influenza A by PCR NEGATIVE NEGATIVE Final   Influenza B by PCR NEGATIVE NEGATIVE Final    Comment: (NOTE) The Xpert Xpress SARS-CoV-2/FLU/RSV plus assay is intended as an aid in the diagnosis of influenza from Nasopharyngeal swab specimens and should not be used as a sole basis for treatment. Nasal washings and aspirates are unacceptable for Xpert Xpress SARS-CoV-2/FLU/RSV testing.  Fact Sheet for Patients: BloggerCourse.com  Fact Sheet for Healthcare Providers: SeriousBroker.it  This test is not yet approved or cleared by the [place] FDA and has been authorized for detection and/or diagnosis of SARS-CoV-2 by FDA under an Emergency Use Authorization (EUA). This EUA will remain in effect (meaning this test can be used) for the duration of the COVID-19 declaration under Section 564(b)(1) of the Act, 21 U.S.C. section 360bbb-3(b)(1), unless the authorization is terminated or revoked.  Performed at [facility], [address] [place], [place] [address]   MRSA Next Gen by PCR, Nasal     Status: None   Collection Time: 07/02/23  8:29 PM   Specimen: Nasal Mucosa; Nasal Swab  Result Value Ref Range Status   MRSA by PCR Next Gen NOT DETECTED NOT DETECTED Final    Comment: (NOTE) The GeneXpert MRSA Assay (FDA approved for NASAL specimens only), is one component of a comprehensive MRSA colonization surveillance program. It is not intended to diagnose MRSA infection nor to guide or monitor treatment for MRSA infections. Test performance is not FDA approved in patients less than 17 years old. Performed at [facility], [address] [place], [place] [address]          Radiology Studies: No results found.      Scheduled Meds:  Chlorhexidine Gluconate Cloth  6 each Topical Q0600   cyclobenzaprine  5 mg Oral QHS    enoxaparin (LOVENOX) injection  0.5 mg/kg Subcutaneous Q24H   furosemide  40 mg Oral Daily   lidocaine  1 patch Transdermal Q24H   mupirocin ointment  1 Application Nasal BID   sodium chloride flush  3 mL Intravenous Q12H   Continuous Infusions:   ceFAZolin (ANCEF) IV 2 g (07/05/23 0516)     LOS: 3 days     Tresa Moore, MD [facility]   If 7PM-7AM, please contact night-coverage  07/05/2023, 11:57 AM

## 2023-07-05 NOTE — Plan of Care (Signed)

## 2023-07-06 DIAGNOSIS — A419 Sepsis, unspecified organism: Secondary | ICD-10-CM | POA: Diagnosis not present

## 2023-07-06 MED ORDER — CEPHALEXIN 500 MG PO CAPS: 500.0000 mg | ORAL_CAPSULE | Freq: Two times a day (BID) | ORAL | 0 refills | Status: AC

## 2023-07-06 NOTE — TOC Initial Note (Addendum)
Transition of Care [facility]) - Initial/Assessment Note    Patient Details  Name: Sandra Holloway MRN: [id number] Date of Birth: [birth year]  Transition of Care [facility]) CM/SW Contact:    Kreg Shropshire, RN Phone Number: 07/06/2023, 10:49 AM  Clinical Narrative:                 Cm assessed for TOC needs for [facility] PT/OT/RN and Aid. Pt stated she is already active with [facility] RN/aid with trach and vent care. Cm called [facility] [phone]. Rep stated that pt is already active with RN/aid. She will have to speak with her Director about PT/OT. Cm awaiting to get call from Director about PT/OT services.  BSC 3 in 1 ordered. Cm sent message to Yvone Neu of [employer] to get [facility] delivered to pt house per pt request.  Pt arrived from ED from: Home Caregiver Support: Lives with daughter DME at Home: Pt has all supplies for trach and vent provided by [facility] company Transportation: By car and ambulance First Person of Contact: Daughter PCP: Matilde Bash, MD  1141-Cm called [facility] again with no response to see if they can take pt PT/OT and if they need d/c summary for pt. Cm sent referrals for PT/OT to [facility], [facility], [facility], and [facility]. All stated that insurance is out of network. [facility] stated that they will take a look.  Cletis Athens of [employer] stated that pt orders need to be changed to bariatric. Cm updated MD.  1154-Cm called and LVM for [facility] again to confirm if they can take PT/OT along with RN and aid resumption of services.  Cm will continue to follow for toc needs and d/c planning.   Expected Discharge Plan: Home w Home Health Services Barriers to Discharge: Continued Medical Work up   Patient Goals and CMS Choice   CMS Medicare.gov Compare Post Acute Care list provided to:: Patient Choice offered to / list presented to : Patient      Expected Discharge Plan and Services     Post Acute Care Choice: Home Health Living arrangements for the  past 2 months: Single Family Home                 DME Arranged: 3-N-1 DME Agency: [facility] Date DME Agency Contacted: 07/06/23 Time DME Agency Contacted: 1048 Representative spoke with at DME Agency: Yvone Neu HH Arranged: RN, PT, OT, Nurse's Aide HH Agency:  ([facility] in [place]) Date [facility] Agency Contacted: 07/06/23 Time HH Agency Contacted: 1048 Representative spoke with at [facility] Agency: Rep at front desk  Prior Living Arrangements/Services Living arrangements for the past 2 months: Single Family Home Lives with:: Self, Adult Children Patient language and need for interpreter reviewed:: Yes        Need for Family Participation in Patient Care: Yes (Comment) Care giver support system in place?: Yes (comment) Current home services: DME, Homehealth aide, Home RN    Activities of Daily Living Home Assistive Devices/Equipment: Environmental consultant (specify type) ADL Screening (condition at time of admission) Patient's cognitive ability adequate to safely complete daily activities?: Yes Is the patient deaf or have difficulty hearing?: No Does the patient have difficulty seeing, even when wearing glasses/contacts?: No Does the patient have difficulty concentrating, remembering, or making decisions?: No Patient able to express need for assistance with ADLs?: Yes Does the patient have difficulty dressing or bathing?: No Independently performs ADLs?: Yes (appropriate for developmental age) Does the patient have difficulty walking or  climbing stairs?: Yes Weakness of Legs: Both Weakness of Arms/Hands: None  Permission Sought/Granted                  Emotional Assessment Appearance:: Appears stated age Attitude/Demeanor/Rapport: Engaged Affect (typically observed): Calm Orientation: : Oriented to Self, Oriented to Place, Oriented to  Time, Oriented to Situation      Admission diagnosis:  Cellulitis of left foot [L03.116] Cellulitis of left leg [L03.116] Sepsis, due to unspecified  organism, unspecified whether acute organ dysfunction present [facility]) [A41.9] Patient Active Problem List   Diagnosis Date Noted   Sepsis (HCC) 07/03/2023   Obesity hypoventilation syndrome (HCC) 07/02/2023   Cellulitis of left foot 07/02/2023   Anxiety and depression 06/09/2023   Stage III chronic kidney disease (HCC) 06/09/2023   Chronic heart failure with preserved ejection fraction (HCC) 02/14/2023   Acute and chr resp failure, unsp w hypoxia or hypercapnia (HCC) 11/03/2022   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   Acute respiratory failure (HCC) 10/17/2022   Pneumonia due to infectious organism 10/17/2022   Supplemental oxygen dependent 08/12/2022   Prediabetes 07/26/2022   Acute asthma exacerbation 05/31/2022   GERD without esophagitis 05/31/2022   Sepsis due to cellulitis (HCC) 03/28/2022   Morbid obesity with BMI of 70 and over, adult (HCC) 03/28/2022   Acute on chronic respiratory failure with hypoxia (HCC) 03/28/2022   H/O tracheostomy 03/28/2022   Anemia 01/18/2022   Pain syndrome, chronic 01/18/2022   Mixed incontinence 06/25/2015   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 [place] [address] Phone: [phone] Fax: [phone]  [id number] - [place], [place] - [address] [place] [address] Phone: [phone] Fax: [phone]     Social Determinants of Health (SDOH) Social History: SDOH Screenings   Food Insecurity: No Food Insecurity (07/05/2023)  Housing: Low Risk  (07/05/2023)  Transportation Needs: No Transportation Needs (07/05/2023)  Utilities: Not At Risk (07/05/2023)  Tobacco Use: Medium Risk (07/05/2023)   SDOH Interventions:     Readmission Risk Interventions     No data to display

## 2023-07-06 NOTE — Discharge Summary (Signed)
Physician Discharge Summary  [name]:[id number] DOB: January 19, 1976 DOA: 07/02/2023  PCP: Dairl Ponder, MD  Admit date: 07/02/2023 Discharge date: 07/06/2023  Admitted From: Home Disposition:  Home with home health  Recommendations for Outpatient Follow-up:  Follow up with PCP in 1-2 weeks   Home Health:Yes PT OT RN aide Equipment/Devices:3n1   Discharge Condition:Stable  CODE STATUS:FULL  Diet recommendation: Reg  Brief/Interim Summary:  Sandra Holloway is a 47 y.o. female with medical history significant of obesity hypoventilation syndrome with tracheostomy and nighttime ventilator dependence, CKD stage IIIa, hypertension, prediabetes, bipolar disorder, anxiety/depression, who presented to the hospital because of left foot pain and fever. She was tachypneic, tachycardic and febrile in the ED with Tmax of 103.2 F.  She also had leukocytosis with WBC of 16,000.  She was admitted to the hospital for sepsis secondary to left leg and foot cellulitis.  Patient admitted to stepdown unit due to need for nocturnal mechanical ventilation.  Cellulitis improved.  Sepsis physiology resolved.  Pain control improved.  Patient stable for discharge.  Will recommend short course of p.o. antibiotics to complete 1 week course.    Discharge Diagnoses:  Principal Problem:   Sepsis (HCC) Active Problems:   Obesity hypoventilation syndrome (HCC)   Cellulitis of left foot   Chronic heart failure with preserved ejection fraction (HCC)   Essential hypertension   Morbid obesity with BMI of 70 and over, adult (HCC)   Stage III chronic kidney disease (HCC)  Sepsis secondary to left lower extremity nonpurulent cellulitis:  Leukocytosis has improved.   Patient afebrile Pain controlled DC cefazolin Recommend Keflex x 4 days to complete 7-day antibiotic course Home health PT, OT, RN, aide ordered at discharge    Discharge Instructions  Discharge Instructions     Diet - low sodium heart  healthy   Complete by: As directed    Increase activity slowly   Complete by: As directed       Allergies as of 07/06/2023   No Known Allergies      Medication List     STOP taking these medications    hydrOXYzine 50 MG tablet Commonly known as: ATARAX   terbinafine 250 MG tablet Commonly known as: LAMISIL       TAKE these medications    acetaminophen 325 MG tablet Commonly known as: TYLENOL Take 2 tablets (650 mg total) by mouth every 6 (six) hours as needed for mild pain (or Fever >/= 101).   albuterol 108 (90 Base) MCG/ACT inhaler Commonly known as: VENTOLIN HFA Inhale 2 puffs into the lungs every 6 (six) hours as needed for wheezing or shortness of breath.   cephALEXin 500 MG capsule Commonly known as: Keflex Take 1 capsule (500 mg total) by mouth 2 (two) times daily for 4 days. Start taking on: July 07, 2023   cyclobenzaprine 5 MG tablet Commonly known as: FLEXERIL Take 5 mg by mouth at bedtime.   DULoxetine 30 MG capsule Commonly known as: CYMBALTA Take 30 mg by mouth at bedtime.   furosemide 40 MG tablet Commonly known as: LASIX Take 40 mg by mouth daily.   ipratropium-albuterol 0.5-2.5 (3) MG/3ML Soln Commonly known as: DUONEB Take 3 mLs by nebulization every 6 (six) hours as needed.   loratadine 10 MG tablet Commonly known as: CLARITIN Take 10 mg by mouth daily.   omeprazole 20 MG capsule Commonly known as: PRILOSEC Take 20 mg by mouth daily.   oxyCODONE-acetaminophen 10-325 MG tablet Commonly known as: PERCOCET Take 1  tablet by mouth every 4 (four) hours as needed. What changed: Another medication with the same name was removed. Continue taking this medication, and follow the directions you see here.   pregabalin 25 MG capsule Commonly known as: LYRICA Take 25 mg by mouth 2 (two) times daily.   triamcinolone cream 0.1 % Commonly known as: KENALOG Apply 1 Application topically 2 (two) times daily.   Xtampza ER 9 MG C12a Generic  drug: oxyCODONE ER Take 1 capsule by mouth at bedtime.               Durable Medical Equipment  (From admission, onward)           Start     Ordered   07/06/23 1201  For home use only DME 3 n 1  Once       Comments: Bariatric   07/06/23 1200            No Known Allergies  Consultations: None   Procedures/Studies: US Venous Img Lower Unilateral Left  Result Date: 07/02/2023 CLINICAL DATA:  Edema.  Cellulitis.  Rule out DVT. EXAM: Left LOWER EXTREMITY VENOUS DOPPLER ULTRASOUND TECHNIQUE: Gray-scale sonography with compression, as well as color and duplex ultrasound, were performed to evaluate the deep venous system(s) from the level of the common femoral vein through the popliteal and proximal calf veins. COMPARISON:  None Available. FINDINGS: VENOUS Normal compressibility of the common femoral, superficial femoral, and popliteal veins, as well as the visualized calf veins. Visualized portions of profunda femoral vein and great saphenous vein unremarkable. No filling defects to suggest DVT on grayscale or color Doppler imaging. Doppler waveforms show normal direction of venous flow, normal respiratory plasticity and response to augmentation. Limited views of the contralateral common femoral vein are unremarkable. OTHER None. Limitations: Exam detail is diminished secondary to large fatty habitus and poor patient mobility. IMPRESSION: No femoropopliteal DVT nor evidence of DVT within the visualized calf veins. Electronically Signed   By: Signa Kell M.D.   On: 07/02/2023 17:34   DG Tibia/Fibula Left  Result Date: 07/02/2023 CLINICAL DATA:  Pain and swelling EXAM: LEFT TIBIA AND FIBULA - 2 VIEW COMPARISON:  None Available. FINDINGS: No recent fracture or dislocation is seen. There are no focal lytic lesions. Degenerative changes are noted with bony spurs in medial, lateral and patellofemoral compartments in the left knee. There is narrowing of joint space in the medial  compartment. Plantar spur is seen in calcaneus. There is edema in subcutaneous plane. IMPRESSION: No fracture or dislocation is seen in left tibia and fibula. No focal lytic lesions are seen. Degenerative changes are noted in left knee. Electronically Signed   By: Ernie Avena M.D.   On: 07/02/2023 16:54   DG Chest Port 1 View  Result Date: 07/02/2023 CLINICAL DATA:  Edema in the lower extremities EXAM: PORTABLE CHEST 1 VIEW COMPARISON:  11/03/2022 FINDINGS: Transverse diameter of heart is increased. Central pulmonary vessels are prominent suggesting CHF. There are no focal pulmonary infiltrates. Lateral CP angles are indistinct. There is no pneumothorax. Tip of the tracheostomy is 5 cm above the carina. IMPRESSION: Technically limited study. Cardiomegaly. Central pulmonary vessels are prominent suggesting CHF. Possible small bilateral pleural effusions. Electronically Signed   By: Ernie Avena M.D.   On: 07/02/2023 16:52   DG Foot Complete Left  Result Date: 07/02/2023 CLINICAL DATA:  Pain and swelling EXAM: LEFT FOOT - COMPLETE 3+ VIEW COMPARISON:  None Available. FINDINGS: Examination is technically limited due to less than  optimal positioning and marked soft tissue swelling. As far as seen, no displaced fracture or dislocation is seen. There is marked soft tissue swelling over the dorsum. Plantar spur is seen in calcaneus. Small bony spurs are noted in the intertarsal and tarsometatarsal joints in the lateral view. IMPRESSION: Technically limited study. No displaced fracture or dislocation is seen. Degenerative changes are noted in multiple joints. There is marked soft tissue swelling over the dorsum. No opaque foreign bodies are seen. Electronically Signed   By: Ernie Avena M.D.   On: 07/02/2023 16:50      Subjective: Seen and examined on the day of discharge.  Stable no distress.  Appropriate for discharge home.  Discharge Exam: Vitals:   07/06/23 0800 07/06/23 0900  BP:  132/73 126/69  Pulse: 71 65  Resp: 15 11  Temp: 98 F (36.7 C)   SpO2: 99% 100%   Vitals:   07/06/23 0605 07/06/23 0700 07/06/23 0800 07/06/23 0900  BP: 132/75 120/66 132/73 126/69  Pulse: 63 62 71 65  Resp: 11 11 15 11   Temp:   98 F (36.7 C)   TempSrc:   Oral   SpO2: 100% 97% 99% 100%  Weight:      Height:        General: Pt is alert, awake, not in acute distress Cardiovascular: RRR, S1/S2 +, no rubs, no gallops Respiratory: Coarse breath sounds bilaterally.  Normal work of breathing.  Trach collar Abdominal: Obese, soft, NT/ND, normal bowel sounds Extremities: no edema, no cyanosis    The results of significant diagnostics from this hospitalization (including imaging, microbiology, ancillary and laboratory) are listed below for reference.     Microbiology: Recent Results (from the past 240 hour(s))  Blood Culture (routine x 2)     Status: None (Preliminary result)   Collection Time: 07/02/23  4:02 PM   Specimen: BLOOD  Result Value Ref Range Status   Specimen Description BLOOD L SHOULDER  Final   Special Requests   Final    BOTTLES DRAWN AEROBIC AND ANAEROBIC Blood Culture results may not be optimal due to an inadequate volume of blood received in culture bottles   Culture   Final    NO GROWTH 4 DAYS Performed at [facility], [address] [place], [place] [address]    Report Status PENDING  Incomplete  Blood Culture (routine x 2)     Status: None (Preliminary result)   Collection Time: 07/02/23  4:07 PM   Specimen: BLOOD  Result Value Ref Range Status   Specimen Description BLOOD LEFT ANTECUBITAL  Final   Special Requests   Final    BOTTLES DRAWN AEROBIC AND ANAEROBIC Blood Culture results may not be optimal due to an inadequate volume of blood received in culture bottles   Culture   Final    NO GROWTH 4 DAYS Performed at [facility], [address] [place], [place] [address]    Report Status PENDING  Incomplete  Resp Panel by  RT-PCR (Flu A&B, Covid) Anterior Nasal Swab     Status: None   Collection Time: 07/02/23  4:25 PM   Specimen: Anterior Nasal Swab  Result Value Ref Range Status   SARS Coronavirus 2 by RT PCR NEGATIVE NEGATIVE Final    Comment: (NOTE) SARS-CoV-2 target nucleic acids are NOT DETECTED.  The SARS-CoV-2 RNA is generally detectable in upper respiratory specimens during the acute phase of infection. The lowest concentration of SARS-CoV-2 viral copies this assay can detect is 138  copies/mL. A negative result does not preclude SARS-Cov-2 infection and should not be used as the sole basis for treatment or other patient management decisions. A negative result may occur with  improper specimen collection/handling, submission of specimen other than nasopharyngeal swab, presence of viral mutation(s) within the areas targeted by this assay, and inadequate number of viral copies(<138 copies/mL). A negative result must be combined with clinical observations, patient history, and epidemiological information. The expected result is Negative.  Fact Sheet for Patients:  BloggerCourse.com  Fact Sheet for Healthcare Providers:  SeriousBroker.it  This test is no t yet approved or cleared by the [place] FDA and  has been authorized for detection and/or diagnosis of SARS-CoV-2 by FDA under an Emergency Use Authorization (EUA). This EUA will remain  in effect (meaning this test can be used) for the duration of the COVID-19 declaration under Section 564(b)(1) of the Act, 21 U.S.C.section 360bbb-3(b)(1), unless the authorization is terminated  or revoked sooner.       Influenza A by PCR NEGATIVE NEGATIVE Final   Influenza B by PCR NEGATIVE NEGATIVE Final    Comment: (NOTE) The Xpert Xpress SARS-CoV-2/FLU/RSV plus assay is intended as an aid in the diagnosis of influenza from Nasopharyngeal swab specimens and should not be used as a sole basis for  treatment. Nasal washings and aspirates are unacceptable for Xpert Xpress SARS-CoV-2/FLU/RSV testing.  Fact Sheet for Patients: BloggerCourse.com  Fact Sheet for Healthcare Providers: SeriousBroker.it  This test is not yet approved or cleared by the [place] FDA and has been authorized for detection and/or diagnosis of SARS-CoV-2 by FDA under an Emergency Use Authorization (EUA). This EUA will remain in effect (meaning this test can be used) for the duration of the COVID-19 declaration under Section 564(b)(1) of the Act, 21 U.S.C. section 360bbb-3(b)(1), unless the authorization is terminated or revoked.  Performed at [facility], [address] [place], [place] [address]   MRSA Next Gen by PCR, Nasal     Status: None   Collection Time: 07/02/23  8:29 PM   Specimen: Nasal Mucosa; Nasal Swab  Result Value Ref Range Status   MRSA by PCR Next Gen NOT DETECTED NOT DETECTED Final    Comment: (NOTE) The GeneXpert MRSA Assay (FDA approved for NASAL specimens only), is one component of a comprehensive MRSA colonization surveillance program. It is not intended to diagnose MRSA infection nor to guide or monitor treatment for MRSA infections. Test performance is not FDA approved in patients less than 97 years old. Performed at [facility], [address] [place], [place] [address]      Labs: BNP (last 3 results) Recent Labs    10/17/22 0652 11/03/22 1111 07/02/23 2049  BNP 55.8 40.9 46.6   Basic Metabolic Panel: Recent Labs  Lab 07/02/23 1536 07/03/23 0603 07/04/23 0325 07/05/23 0928  NA 135 138 138 136  K 4.4 3.3* 3.7 3.8  CL 98 100 103 98  CO2 24 28 27 28   GLUCOSE 109* 119* 106* 104*  BUN 20 17 19 17   CREATININE 1.31* 1.10* 1.26* 1.13*  CALCIUM 9.3 8.6* 8.6* 8.6*   Liver Function Tests: Recent Labs  Lab 07/02/23 1536  AST 21  ALT 14  ALKPHOS 64  BILITOT 0.9  PROT 8.0  ALBUMIN  3.9   No results for input(s): "LIPASE", "AMYLASE" in the last 168 hours. No results for input(s): "AMMONIA" in the last 168 hours. CBC: Recent Labs  Lab 07/02/23 1536 07/03/23 0603 07/04/23 0325 07/05/23  0928  WBC 16.0* 11.5* 9.8 8.2  NEUTROABS 14.0*  --  6.6 5.4  HGB 11.1* 9.9* 9.3* 9.0*  HCT 33.6* 30.4* 29.8* 28.5*  MCV 80.8 81.9 85.6 83.3  PLT 180 187 192 199   Cardiac Enzymes: No results for input(s): "CKTOTAL", "CKMB", "CKMBINDEX", "TROPONINI" in the last 168 hours. BNP: Invalid input(s): "POCBNP" CBG: Recent Labs  Lab 07/02/23 1957  GLUCAP 131*   D-Dimer No results for input(s): "DDIMER" in the last 72 hours. Hgb A1c No results for input(s): "HGBA1C" in the last 72 hours. Lipid Profile No results for input(s): "CHOL", "HDL", "LDLCALC", "TRIG", "CHOLHDL", "LDLDIRECT" in the last 72 hours. Thyroid function studies No results for input(s): "TSH", "T4TOTAL", "T3FREE", "THYROIDAB" in the last 72 hours.  Invalid input(s): "FREET3" Anemia work up No results for input(s): "VITAMINB12", "FOLATE", "FERRITIN", "TIBC", "IRON", "RETICCTPCT" in the last 72 hours. Urinalysis    Component Value Date/Time   COLORURINE STRAW (A) 10/17/2022 1059   APPEARANCEUR CLEAR (A) 10/17/2022 1059   LABSPEC 1.011 10/17/2022 1059   PHURINE 5.0 10/17/2022 1059   GLUCOSEU NEGATIVE 10/17/2022 1059   HGBUR NEGATIVE 10/17/2022 1059   BILIRUBINUR NEGATIVE 10/17/2022 1059   KETONESUR NEGATIVE 10/17/2022 1059   PROTEINUR 100 (A) 10/17/2022 1059   NITRITE NEGATIVE 10/17/2022 1059   LEUKOCYTESUR NEGATIVE 10/17/2022 1059   Sepsis Labs Recent Labs  Lab 07/02/23 1536 07/03/23 0603 07/04/23 0325 07/05/23 0928  WBC 16.0* 11.5* 9.8 8.2   Microbiology Recent Results (from the past 240 hour(s))  Blood Culture (routine x 2)     Status: None (Preliminary result)   Collection Time: 07/02/23  4:02 PM   Specimen: BLOOD  Result Value Ref Range Status   Specimen Description BLOOD L SHOULDER   Final   Special Requests   Final    BOTTLES DRAWN AEROBIC AND ANAEROBIC Blood Culture results may not be optimal due to an inadequate volume of blood received in culture bottles   Culture   Final    NO GROWTH 4 DAYS Performed at [facility], [address] [place], [place] [address]    Report Status PENDING  Incomplete  Blood Culture (routine x 2)     Status: None (Preliminary result)   Collection Time: 07/02/23  4:07 PM   Specimen: BLOOD  Result Value Ref Range Status   Specimen Description BLOOD LEFT ANTECUBITAL  Final   Special Requests   Final    BOTTLES DRAWN AEROBIC AND ANAEROBIC Blood Culture results may not be optimal due to an inadequate volume of blood received in culture bottles   Culture   Final    NO GROWTH 4 DAYS Performed at [facility], [address], [place], [place] [address]    Report Status PENDING  Incomplete  Resp Panel by RT-PCR (Flu A&B, Covid) Anterior Nasal Swab     Status: None   Collection Time: 07/02/23  4:25 PM   Specimen: Anterior Nasal Swab  Result Value Ref Range Status   SARS Coronavirus 2 by RT PCR NEGATIVE NEGATIVE Final    Comment: (NOTE) SARS-CoV-2 target nucleic acids are NOT DETECTED.  The SARS-CoV-2 RNA is generally detectable in upper respiratory specimens during the acute phase of infection. The lowest concentration of SARS-CoV-2 viral copies this assay can detect is 138 copies/mL. A negative result does not preclude SARS-Cov-2 infection and should not be used as the sole basis for treatment or other patient management decisions. A negative result may occur with  improper specimen collection/handling, submission of  specimen other than nasopharyngeal swab, presence of viral mutation(s) within the areas targeted by this assay, and inadequate number of viral copies(<138 copies/mL). A negative result must be combined with clinical observations, patient history, and epidemiological information. The expected result is  Negative.  Fact Sheet for Patients:  BloggerCourse.com  Fact Sheet for Healthcare Providers:  SeriousBroker.it  This test is no t yet approved or cleared by the [place] FDA and  has been authorized for detection and/or diagnosis of SARS-CoV-2 by FDA under an Emergency Use Authorization (EUA). This EUA will remain  in effect (meaning this test can be used) for the duration of the COVID-19 declaration under Section 564(b)(1) of the Act, 21 U.S.C.section 360bbb-3(b)(1), unless the authorization is terminated  or revoked sooner.       Influenza A by PCR NEGATIVE NEGATIVE Final   Influenza B by PCR NEGATIVE NEGATIVE Final    Comment: (NOTE) The Xpert Xpress SARS-CoV-2/FLU/RSV plus assay is intended as an aid in the diagnosis of influenza from Nasopharyngeal swab specimens and should not be used as a sole basis for treatment. Nasal washings and aspirates are unacceptable for Xpert Xpress SARS-CoV-2/FLU/RSV testing.  Fact Sheet for Patients: BloggerCourse.com  Fact Sheet for Healthcare Providers: SeriousBroker.it  This test is not yet approved or cleared by the [place] FDA and has been authorized for detection and/or diagnosis of SARS-CoV-2 by FDA under an Emergency Use Authorization (EUA). This EUA will remain in effect (meaning this test can be used) for the duration of the COVID-19 declaration under Section 564(b)(1) of the Act, 21 U.S.C. section 360bbb-3(b)(1), unless the authorization is terminated or revoked.  Performed at [facility], [address] [place], [place] [address]   MRSA Next Gen by PCR, Nasal     Status: None   Collection Time: 07/02/23  8:29 PM   Specimen: Nasal Mucosa; Nasal Swab  Result Value Ref Range Status   MRSA by PCR Next Gen NOT DETECTED NOT DETECTED Final    Comment: (NOTE) The GeneXpert MRSA Assay (FDA approved for NASAL  specimens only), is one component of a comprehensive MRSA colonization surveillance program. It is not intended to diagnose MRSA infection nor to guide or monitor treatment for MRSA infections. Test performance is not FDA approved in patients less than 68 years old. Performed at [facility], [address] [place], [place] [address]      Time coordinating discharge: Over 30 minutes  SIGNED:   Tresa Moore, MD  [facility] 07/06/2023, 2:57 PM Pager   If 7PM-7AM, please contact night-coverage

## 2023-07-06 NOTE — TOC CM/SW Note (Signed)
Patient is not able to walk the distance required to go the bathroom, or he/she is unable to safely negotiate stairs required to access the bathroom.  A 3in1 BSC will alleviate this problem  

## 2023-07-06 NOTE — Progress Notes (Signed)
Sandra Holloway to be D/C'd Home with [facility] per MD order.  Discussed prescriptions and follow up appointments with the patient. Prescriptions given to patient, medication list explained in detail. Pt verbalized understanding.  Allergies as of 07/06/2023   No Known Allergies      Medication List     STOP taking these medications    hydrOXYzine 50 MG tablet Commonly known as: ATARAX   terbinafine 250 MG tablet Commonly known as: LAMISIL       TAKE these medications    acetaminophen 325 MG tablet Commonly known as: TYLENOL Take 2 tablets (650 mg total) by mouth every 6 (six) hours as needed for mild pain (or Fever >/= 101).   albuterol 108 (90 Base) MCG/ACT inhaler Commonly known as: VENTOLIN HFA Inhale 2 puffs into the lungs every 6 (six) hours as needed for wheezing or shortness of breath.   cephALEXin 500 MG capsule Commonly known as: Keflex Take 1 capsule (500 mg total) by mouth 2 (two) times daily for 4 days. Start taking on: July 07, 2023   cyclobenzaprine 5 MG tablet Commonly known as: FLEXERIL Take 5 mg by mouth at bedtime.   DULoxetine 30 MG capsule Commonly known as: CYMBALTA Take 30 mg by mouth at bedtime.   furosemide 40 MG tablet Commonly known as: LASIX Take 40 mg by mouth daily.   ipratropium-albuterol 0.5-2.5 (3) MG/3ML Soln Commonly known as: DUONEB Take 3 mLs by nebulization every 6 (six) hours as needed.   loratadine 10 MG tablet Commonly known as: CLARITIN Take 10 mg by mouth daily.   omeprazole 20 MG capsule Commonly known as: PRILOSEC Take 20 mg by mouth daily.   oxyCODONE-acetaminophen 10-325 MG tablet Commonly known as: PERCOCET Take 1 tablet by mouth every 4 (four) hours as needed. What changed: Another medication with the same name was removed. Continue taking this medication, and follow the directions you see here.   pregabalin 25 MG capsule Commonly known as: LYRICA Take 25 mg by mouth 2 (two) times daily.   triamcinolone cream  0.1 % Commonly known as: KENALOG Apply 1 Application topically 2 (two) times daily.   Xtampza ER 9 MG C12a Generic drug: oxyCODONE ER Take 1 capsule by mouth at bedtime.               Durable Medical Equipment  (From admission, onward)           Start     Ordered   07/06/23 0758  For home use only DME 3 n 1  Once        07/06/23 0757            Vitals:   07/06/23 0800 07/06/23 0900  BP: 132/73 126/69  Pulse: 71 65  Resp: 15 11  Temp: 98 F (36.7 C)   SpO2: 99% 100%    Skin clean, dry and intact without evidence of skin break down, no evidence of skin tears noted. IV catheter discontinued intact. Site without signs and symptoms of complications. Dressing and pressure applied. Pt denies pain at this time. No complaints noted.  An After Visit Summary was printed and given to the patient. Patient escorted via WC, and D/C home via private auto.  Rigoberto Noel

## 2023-07-06 NOTE — Plan of Care (Signed)

## 2023-07-07 LAB — CULTURE, BLOOD (ROUTINE X 2)
Culture: NO GROWTH
Culture: NO GROWTH

## 2023-09-09 ENCOUNTER — Emergency Department
Admission: EM | Admit: 2023-09-09 | Discharge: 2023-09-09 | Disposition: A | Discharge status: Home / Self Care | Payer: Medicaid Other | Attending: Student in an Organized Health Care Education/Training Program | Admitting: Student in an Organized Health Care Education/Training Program

## 2023-09-09 ENCOUNTER — Emergency Department: Payer: Medicaid Other

## 2023-09-09 DIAGNOSIS — R0902 Hypoxemia: Secondary | ICD-10-CM | POA: Insufficient documentation

## 2023-09-09 DIAGNOSIS — J45909 Unspecified asthma, uncomplicated: Secondary | ICD-10-CM | POA: Diagnosis not present

## 2023-09-09 DIAGNOSIS — I509 Heart failure, unspecified: Secondary | ICD-10-CM | POA: Insufficient documentation

## 2023-09-09 DIAGNOSIS — R0603 Acute respiratory distress: Secondary | ICD-10-CM | POA: Diagnosis present

## 2023-09-09 LAB — CBC WITH DIFFERENTIAL/PLATELET
Abs Immature Granulocytes: 0.09 10*3/uL — ABNORMAL HIGH (ref 0.00–0.07)
Basophils Absolute: 0 10*3/uL (ref 0.0–0.1)
Basophils Relative: 0 %
Eosinophils Absolute: 0.1 10*3/uL (ref 0.0–0.5)
Eosinophils Relative: 1 %
HCT: 32.5 % — ABNORMAL LOW (ref 36.0–46.0)
Hemoglobin: 10.1 g/dL — ABNORMAL LOW (ref 12.0–15.0)
Immature Granulocytes: 1 %
Lymphocytes Relative: 15 %
Lymphs Abs: 2 10*3/uL (ref 0.7–4.0)
MCH: 26.4 pg (ref 26.0–34.0)
MCHC: 31.1 g/dL (ref 30.0–36.0)
MCV: 84.9 fL (ref 80.0–100.0)
Monocytes Absolute: 0.4 10*3/uL (ref 0.1–1.0)
Monocytes Relative: 3 %
Neutro Abs: 10.6 10*3/uL — ABNORMAL HIGH (ref 1.7–7.7)
Neutrophils Relative %: 80 %
Platelets: 166 10*3/uL (ref 150–400)
RBC: 3.83 MIL/uL — ABNORMAL LOW (ref 3.87–5.11)
RDW: 15.6 % — ABNORMAL HIGH (ref 11.5–15.5)
Smear Review: ADEQUATE
WBC: 13.3 10*3/uL — ABNORMAL HIGH (ref 4.0–10.5)
nRBC: 0 % (ref 0.0–0.2)

## 2023-09-09 LAB — BASIC METABOLIC PANEL
Anion gap: 11 (ref 5–15)
BUN: 13 mg/dL (ref 6–20)
CO2: 24 mmol/L (ref 22–32)
Calcium: 8.6 mg/dL — ABNORMAL LOW (ref 8.9–10.3)
Chloride: 101 mmol/L (ref 98–111)
Creatinine, Ser: 1.2 mg/dL — ABNORMAL HIGH (ref 0.44–1.00)
GFR, Estimated: 56 mL/min — ABNORMAL LOW (ref >60)
Glucose, Bld: 150 mg/dL — ABNORMAL HIGH (ref 70–99)
Potassium: 3.5 mmol/L (ref 3.5–5.1)
Sodium: 136 mmol/L (ref 135–145)

## 2023-09-09 MED ORDER — DOXYCYCLINE HYCLATE 100 MG PO TABS
100.0000 mg | ORAL_TABLET | Freq: Once | ORAL | Status: AC
  Administered 2023-09-09: 100 mg via ORAL
  Filled 2023-09-09: qty 1

## 2023-09-09 MED ORDER — FUROSEMIDE 10 MG/ML IJ SOLN
40.0000 mg | Freq: Once | INTRAMUSCULAR | Status: AC
  Administered 2023-09-09: 40 mg via INTRAVENOUS
  Filled 2023-09-09: qty 4

## 2023-09-09 MED ORDER — IPRATROPIUM-ALBUTEROL 0.5-2.5 (3) MG/3ML IN SOLN
3.0000 mL | Freq: Once | RESPIRATORY_TRACT | Status: AC
  Administered 2023-09-09: 3 mL via RESPIRATORY_TRACT
  Filled 2023-09-09: qty 3

## 2023-09-09 NOTE — ED Triage Notes (Signed)
Pt to ED via EMS. Pt was on way to [facility] for tracheostomy suctioning when she had SOB. Pt was satting in 50%. Pt on 15 L NRB. Pt has temp of 99.9 F and is suctioning a moderate amount of secretions.

## 2023-09-09 NOTE — ED Notes (Signed)
PT  GOING TO  [facility]  EMERGENCY  DEPT  BY  Melburn Hake

## 2023-09-09 NOTE — ED Notes (Signed)
EMTALA reviewed by this RN, transfer consent obtained. Pt ready for transport.

## 2023-09-09 NOTE — ED Provider Notes (Signed)
 [facility] EMERGENCY DEPT  ED Provider Note History   Chief Complaint  Patient presents with  . Respiratory Distress   History of Present Illness Patient is a 47 year old female with past medical history significant for obesity hypoventilation syndrome c/b tracheostomy and nighttime ventilator dependence, HFpEF, CKD, HTN, prediabetes, bipolar disorder, anxiety, depression presenting for shortness of breath via EMS.  Patient was reportedly on her way to [facility] for trach exchange today, when she developed acute onset shortness of breath.  Patient was satting in the 56s.  Requiring 15 L of oxygen via nonrebreather on presentation to OSH at [facility] health.  Temp of 99.44F, suctioning moderate amount of secretions.  Patient transferred via EMS, on 6 L throughout transport, increased to 10 L on presentation to [facility].  Patient notes that she has been coughing more than usual, pretty much since her last trach exchange 3 months ago.  Cough has been semiproductive of brown, dried secretions.  She notes mild fevers at home up to 99.44F.  She has not had any nausea, vomiting, diarrhea, dysuria, sore throat, runny nose, congestion.  Her trach collar is a 6 XLT Shiley.  It was last exchanged 3 months ago, originally placed in March 2016.  She is able to suction her trach appropriately, no concern for obstruction.   Patient Info:    History provided by:  Patient and EMS personnel  Past Medical History:  Diagnosis Date  . Anxiety   . Arthritis   . Asthma without status asthmaticus (HHS-HCC)   . Bipolar disorder (CMS/HHS-HCC)   . Chronic pain   . Depression   . Hypertension   . Low back pain   . Morbid obesity with BMI of 60.0-69.9, adult (CMS/HHS-HCC) 05/20/2016  . Neck pain   . Sleep apnea    Past Surgical History:  Procedure Laterality Date  . REPEAT CESAREAN SECTION  2003  . TRACHEOSTOMY ADULT N/A 01/28/2015   Procedure: Open TRACHEOSTOMY, PLANNED (SEPARATE PROCEDURE);;  Surgeon: Andrez Vernell Sprang, MD;   Location: [facility];  Service: General Surgery;  Laterality: N/A;  Open  . LARYNGOSCOPY  10/10/2015   Procedure: LARYNGOSCOPY DIRECT, WITH OR WITHOUT TRACHEOSCOPY; DIAGNOSTIC, EXCEPT NEWBORN;  Surgeon: Thedora Dannielle Schneider, MD;  Location: [facility];  Service: Otolaryngology Head and Neck;;  . TRACHEOTOMY TUBE CHANGE BEFORE ESTABLISHMENT FISTULA TRACT N/A 12/19/2015   Procedure: TRACHEOTOMY TUBE CHANGE PRIOR TO ESTABLISHMENT OF FISTULA TRACT;  Surgeon: Jolena Sages, MD;  Location: [facility];  Service: General Surgery;  Laterality: N/A;  . BRONCHOSCOPY FLEXIBLE N/A 12/19/2015   Procedure: BRONCHOSCOPY, FLEXIBLE, INCLUDING FLUOROSCOPIC GUIDANCE, WHEN PERFORMED; DIAGNOSTIC, WITH CELL WASHING, WHEN PERFORMED (SEPARATE PROCEDURE);  Surgeon: Jolena Sages, MD;  Location: [facility];  Service: General Surgery;  Laterality: N/A;  . LARYNGOSCOPY  02/06/2016   Procedure: LARYNGOSCOPY DIRECT, WITH OR WITHOUT TRACHEOSCOPY; WITH DILATION, INITIAL;  Surgeon: Thedora Dannielle Schneider, MD;  Location: [facility];  Service: Otolaryngology Head and Neck;;  . LARYNGOSCOPY FLEXIBLE N/A 05/21/2016   Procedure: LARYNGOSCOPY, FLEXIBLE; DIAGNOSTIC;  Surgeon: Thedora Dannielle Schneider, MD;  [facility]: [facility];  Service: Otolaryngology Head and Neck;  Laterality: N/A;  . TRACHEOTOMY TUBE CHANGE BEFORE ESTABLISHMENT FISTULA TRACT N/A 05/21/2016   Procedure: TRACHEOTOMY TUBE CHANGE PRIOR TO ESTABLISHMENT OF FISTULA TRACT;  Surgeon: Thedora Dannielle Schneider, MD;  [facility]: [facility];  Service: Otolaryngology Head and Neck;  Laterality: N/A;  . TRACHEOTOMY TUBE CHANGE BEFORE ESTABLISHMENT FISTULA TRACT N/A 08/20/2016   Procedure: TRACHEOTOMY TUBE CHANGE PRIOR TO ESTABLISHMENT  OF FISTULA TRACT;  Surgeon: Thedora Dannielle Schneider, MD;  [facility]: [facility];  Service: Otolaryngology Head and Neck;  Laterality: N/A;  . TRACHEOTOMY TUBE CHANGE BEFORE ESTABLISHMENT FISTULA TRACT N/A 12/10/2016   Procedure: *0730am Preop Appt Today*  TRACHEOTOMY TUBE CHANGE PRIOR TO ESTABLISHMENT OF FISTULA TRACT;  Surgeon: Thedora Dannielle Schneider, MD;  [facility];  Service: Otolaryngology Head and Neck;  Laterality: N/A;  . TRACHEOTOMY TUBE CHANGE BEFORE ESTABLISHMENT FISTULA TRACT N/A 05/06/2017   Procedure: **11AM Preop Appt** TRACHEOTOMY TUBE CHANGE PRIOR TO ESTABLISHMENT OF FISTULA TRACT;  Surgeon: Thedora Dannielle Schneider, MD;  [facility];  Service: Otolaryngology Head and Neck;  Laterality: N/A;  . LARYNGOSCOPY FLEXIBLE N/A 05/06/2017   Procedure: LARYNGOSCOPY, FLEXIBLE; DIAGNOSTIC;  Surgeon: Thedora Dannielle Schneider, MD;  [facility];  Service: Otolaryngology Head and Neck;  Laterality: N/A;  . TRACHEOTOMY TUBE CHANGE BEFORE ESTABLISHMENT FISTULA TRACT N/A 09/09/2017   Procedure: TRACHEOTOMY TUBE CHANGE PRIOR TO ESTABLISHMENT OF FISTULA TRACT;  Surgeon: Schneider Thedora Dannielle, MD;  [facility]: [facility];  Service: Otolaryngology Head and Neck;  Laterality: N/A;  . LARYNGOSCOPY FLEXIBLE N/A 09/09/2017   Procedure: LARYNGOSCOPY, FLEXIBLE; DIAGNOSTIC;  Surgeon: Schneider Thedora Dannielle, MD;  [facility];  Service: Otolaryngology Head and Neck;  Laterality: N/A;  . TRACHEOTOMY TUBE CHANGE BEFORE ESTABLISHMENT FISTULA TRACT N/A 12/09/2017   Procedure: TRACHEOTOMY TUBE CHANGE PRIOR TO ESTABLISHMENT OF FISTULA TRACT;  Surgeon: Schneider Thedora Dannielle, MD;  [facility]: [facility];  Service: Otolaryngology Head and Neck;  Laterality: N/A;  . LARYNGOSCOPY FLEXIBLE Bilateral 12/09/2017   Procedure: LARYNGOSCOPY, FLEXIBLE; DIAGNOSTIC;  Surgeon: Schneider Thedora Dannielle, MD;  [facility]: [facility];  Service: Otolaryngology Head and Neck;  Laterality: Bilateral;  . TRACHEOTOMY TUBE CHANGE BEFORE ESTABLISHMENT FISTULA TRACT N/A 03/24/2018   Procedure: TRACHEOTOMY TUBE CHANGE PRIOR TO ESTABLISHMENT OF FISTULA TRACT;  Surgeon: Schneider Thedora Dannielle, MD;  [facility]: [facility];  Service: Otolaryngology Head and Neck;  Laterality: N/A;  .  LARYNGOSCOPY FLEXIBLE N/A 03/24/2018   Procedure: LARYNGOSCOPY, FLEXIBLE; DIAGNOSTIC;  Surgeon: Schneider Thedora Dannielle, MD;  [facility]: [facility];  Service: Otolaryngology Head and Neck;  Laterality: N/A;  . TRACHEOTOMY TUBE CHANGE BEFORE ESTABLISHMENT FISTULA TRACT N/A 06/09/2018   Procedure: TRACHEOTOMY TUBE CHANGE PRIOR TO ESTABLISHMENT OF FISTULA TRACT;  Surgeon: Schneider Thedora Dannielle, MD;  [facility];  Service: Otolaryngology Head and Neck;  Laterality: N/A;  . TRACHEOBRONCHOSCOPY THROUGH ESTABLISHED TRACHEOSTOMY N/A 06/09/2018   Procedure: TRACHEOBRONCHOSCOPY THROUGH ESTABLISHED TRACHEOSTOMY INCISION;  Surgeon: Schneider Thedora Dannielle, MD;  [facility];  Service: Otolaryngology Head and Neck;  Laterality: N/A;  . TRACHEOTOMY TUBE CHANGE BEFORE ESTABLISHMENT FISTULA TRACT N/A 09/08/2018   Procedure: TRACHEOTOMY TUBE CHANGE PRIOR TO ESTABLISHMENT OF FISTULA TRACT;  Surgeon: Schneider Thedora Dannielle, MD;  [facility];  Service: Otolaryngology Head and Neck;  Laterality: N/A;  . LARYNGOSCOPY FLEXIBLE N/A 09/08/2018   Procedure: LARYNGOSCOPY, FLEXIBLE; DIAGNOSTIC;  Surgeon: Schneider Thedora Dannielle, MD;  [facility];  Service: Otolaryngology Head and Neck;  Laterality: N/A;  . TRACHEOTOMY TUBE CHANGE BEFORE ESTABLISHMENT FISTULA TRACT N/A 12/08/2018   Procedure: TRACHEOTOMY TUBE CHANGE PRIOR TO ESTABLISHMENT OF FISTULA TRACT;  Surgeon: Schneider Thedora Dannielle, MD;  [facility];  Service: Otolaryngology Head and Neck;  Laterality: N/A;  . TRACHEOBRONCHOSCOPY THROUGH ESTABLISHED TRACHEOSTOMY N/A 12/08/2018   Procedure: TRACHEOBRONCHOSCOPY THROUGH ESTABLISHED TRACHEOSTOMY INCISION;  Surgeon: Schneider Thedora Dannielle, MD;  [facility]: [facility];  Service: Otolaryngology Head and Neck;  Laterality: N/A;  . TRACHEOBRONCHOSCOPY THROUGH ESTABLISHED TRACHEOSTOMY N/A 04/06/2019   Procedure: TRACHEOBRONCHOSCOPY THROUGH ESTABLISHED TRACHEOSTOMY INCISION;  Surgeon: Gust Thedora Blumenthal, MD;  [facility]: [facility];  Service: Otolaryngology Head and Neck;  Laterality: N/A;  . LARYNGOSCOPY FLEXIBLE N/A 04/06/2019   Procedure: LARYNGOSCOPY, FLEXIBLE; DIAGNOSTIC;  Surgeon: Gust Thedora Blumenthal, MD;  [facility];  Service: Otolaryngology Head and Neck;  Laterality: N/A;  . TRACHEOBRONCHOSCOPY THROUGH ESTABLISHED TRACHEOSTOMY N/A 07/27/2019   Procedure: TRACHEOBRONCHOSCOPY THROUGH ESTABLISHED TRACHEOSTOMY INCISION;  Surgeon: Gust Thedora Blumenthal, MD;  [facility];  Service: Otolaryngology Head and Neck;  Laterality: N/A;  . TRACHEOTOMY TUBE CHANGE BEFORE ESTABLISHMENT FISTULA TRACT N/A 07/27/2019   Procedure: TRACHEOTOMY TUBE CHANGE PRIOR TO ESTABLISHMENT OF FISTULA TRACT;  Surgeon: Gust Thedora Blumenthal, MD;  [facility];  Service: Otolaryngology Head and Neck;  Laterality: N/A;  . TRACHEOTOMY TUBE CHANGE BEFORE ESTABLISHMENT FISTULA TRACT N/A 11/09/2019   Procedure: TRACHEOTOMY TUBE CHANGE PRIOR TO ESTABLISHMENT OF FISTULA TRACT;  Surgeon: Gust Thedora Blumenthal, MD;  [facility];  Service: Otolaryngology Head and Neck;  Laterality: N/A;  . TRACHEOBRONCHOSCOPY THROUGH ESTABLISHED TRACHEOSTOMY N/A 11/09/2019   Procedure: TRACHEOBRONCHOSCOPY THROUGH ESTABLISHED TRACHEOSTOMY INCISION;  Surgeon: Gust Thedora Blumenthal, MD;  [facility];  Service: Otolaryngology Head and Neck;  Laterality: N/A;  . TRACHEOTOMY TUBE CHANGE BEFORE ESTABLISHMENT FISTULA TRACT N/A 02/22/2020   Procedure: TRACHEOTOMY TUBE CHANGE PRIOR TO ESTABLISHMENT OF FISTULA TRACT;  Surgeon: Gust Thedora Blumenthal, MD;  [facility];  Service: Otolaryngology Head and Neck;  Laterality: N/A;  . LARYNGOSCOPY FLEXIBLE N/A 02/22/2020   Procedure: LARYNGOSCOPY, FLEXIBLE; DIAGNOSTIC;  Surgeon: Gust Thedora Blumenthal, MD;  [facility];  Service: Otolaryngology Head and Neck;  Laterality: N/A;  . TRACHEOTOMY TUBE CHANGE BEFORE ESTABLISHMENT FISTULA TRACT N/A  05/09/2020   Procedure: TRACHEOTOMY TUBE CHANGE PRIOR TO ESTABLISHMENT OF FISTULA TRACT;  Surgeon: Gust Thedora Blumenthal, MD;  [facility];  Service: Otolaryngology Head and Neck;  Laterality: N/A;  . LARYNGOSCOPY FLEXIBLE N/A 05/09/2020   Procedure: LARYNGOSCOPY, FLEXIBLE; DIAGNOSTIC;  Surgeon: Gust Thedora Blumenthal, MD;  [facility];  Service: Otolaryngology Head and Neck;  Laterality: N/A;  . TRACHEOTOMY TUBE CHANGE BEFORE ESTABLISHMENT FISTULA TRACT N/A 08/22/2020   Procedure: TRACHEOTOMY TUBE CHANGE PRIOR TO ESTABLISHMENT OF FISTULA TRACT;  Surgeon: Gust Thedora Blumenthal, MD;  [facility];  Service: Otolaryngology Head and Neck;  Laterality: N/A;  . LARYNGOSCOPY FLEXIBLE N/A 08/22/2020   Procedure: LARYNGOSCOPY, FLEXIBLE; DIAGNOSTIC;  Surgeon: Gust Thedora Blumenthal, MD;  [facility];  Service: Otolaryngology Head and Neck;  Laterality: N/A;  . TRACHEOTOMY TUBE CHANGE BEFORE ESTABLISHMENT FISTULA TRACT N/A 12/12/2020   Procedure: TRACHEOTOMY TUBE CHANGE PRIOR TO ESTABLISHMENT OF FISTULA TRACT;  Surgeon: Gust Thedora Blumenthal, MD;  [facility];  Service: Otolaryngology Head and Neck;  Laterality: N/A;  . LARYNGOSCOPY FLEXIBLE N/A 12/12/2020   Procedure: LARYNGOSCOPY, FLEXIBLE; DIAGNOSTIC;  Surgeon: Gust Thedora Blumenthal, MD;  [facility];  Service: Otolaryngology Head and Neck;  Laterality: N/A;  . TRACHEOTOMY TUBE CHANGE BEFORE ESTABLISHMENT FISTULA TRACT N/A 04/10/2021   Procedure: TRACHEOTOMY TUBE CHANGE PRIOR TO ESTABLISHMENT OF FISTULA TRACT;  Surgeon: Gust Thedora Blumenthal, MD;  [facility];  Service: Otolaryngology Head and Neck;  Laterality: N/A;  . LARYNGOSCOPY FLEXIBLE N/A 04/10/2021   Procedure: LARYNGOSCOPY, FLEXIBLE; DIAGNOSTIC;  Surgeon: Gust Thedora Blumenthal, MD;  [facility];  Service: Otolaryngology Head and Neck;  Laterality: N/A;  . TRACHEOTOMY TUBE CHANGE BEFORE ESTABLISHMENT FISTULA TRACT N/A 09/11/2021    Procedure: TRACHEOTOMY TUBE CHANGE PRIOR TO ESTABLISHMENT OF FISTULA TRACT;  Surgeon: Gust Thedora Blumenthal, MD;  [facility];  Service: Otolaryngology Head and Neck;  Laterality: N/A;  . LARYNGOSCOPY FLEXIBLE Bilateral 09/11/2021   Procedure: LARYNGOSCOPY, FLEXIBLE; DIAGNOSTIC;  Surgeon: Gust Thedora Blumenthal, MD;  [facility];  Service: Otolaryngology Head and Neck;  Laterality: Bilateral;  . TRACHEOTOMY TUBE CHANGE BEFORE ESTABLISHMENT FISTULA TRACT N/A 01/22/2022   Procedure: TRACHEOTOMY TUBE CHANGE PRIOR TO ESTABLISHMENT OF FISTULA TRACT;  Surgeon: Gust Thedora Blumenthal, MD;  [facility];  Service: Otolaryngology Head and Neck;  Laterality: N/A;  . LARYNGOSCOPY FLEXIBLE N/A 01/22/2022   Procedure: LARYNGOSCOPY, FLEXIBLE; DIAGNOSTIC;  Surgeon: Gust Thedora Blumenthal, MD;  [facility];  Service: Otolaryngology Head and Neck;  Laterality: N/A;  . LARYNGOSCOPY FLEXIBLE N/A 05/07/2022   Procedure: LARYNGOSCOPY, FLEXIBLE; DIAGNOSTIC;  Surgeon: Gust Thedora Blumenthal, MD;  [facility];  Service: Otolaryngology Head and Neck;  Laterality: N/A;  . TRACHEOTOMY TUBE CHANGE BEFORE ESTABLISHMENT FISTULA TRACT N/A 05/07/2022   Procedure: TRACHEOTOMY TUBE CHANGE PRIOR TO ESTABLISHMENT OF FISTULA TRACT;  Surgeon: Gust Thedora Blumenthal, MD;  [facility];  Service: Otolaryngology Head and Neck;  Laterality: N/A;  . TRACHEOTOMY TUBE CHANGE BEFORE ESTABLISHMENT FISTULA TRACT N/A 08/20/2022   Procedure: TRACHEOTOMY TUBE CHANGE PRIOR TO ESTABLISHMENT OF FISTULA TRACT;  Surgeon: Gust Thedora Blumenthal, MD;  [facility];  Service: Otolaryngology Head and Neck;  Laterality: N/A;  . LARYNGOSCOPY FLEXIBLE N/A 08/20/2022   Procedure: LARYNGOSCOPY, FLEXIBLE; DIAGNOSTIC;  Surgeon: Gust Thedora Blumenthal, MD;  [facility];  Service: Otolaryngology Head and Neck;  Laterality: N/A;  . TRACHEOTOMY TUBE CHANGE BEFORE ESTABLISHMENT FISTULA TRACT N/A 01/21/2023    Procedure: TRACHEOTOMY TUBE CHANGE PRIOR TO ESTABLISHMENT OF FISTULA TRACT;  Surgeon: Gust Thedora Blumenthal, MD;  [facility];  Service: Otolaryngology Head and Neck;  Laterality: N/A;  . LARYNGOSCOPY FLEXIBLE N/A 01/21/2023   Procedure: LARYNGOSCOPY, FLEXIBLE; DIAGNOSTIC;  Surgeon: Gust Thedora Blumenthal, MD;  [facility];  Service: Otolaryngology Head and Neck;  Laterality: N/A;  . LARYNGOSCOPY FLEXIBLE N/A 06/10/2023   Procedure: LARYNGOSCOPY, FLEXIBLE; DIAGNOSTIC;  Surgeon: Gust Thedora Blumenthal, MD;  [facility];  Service: Otolaryngology Head and Neck;  Laterality: N/A;  . TRACHEOTOMY TUBE CHANGE BEFORE ESTABLISHMENT FISTULA TRACT N/A 06/10/2023   Procedure: TRACHEOTOMY TUBE CHANGE PRIOR TO ESTABLISHMENT OF FISTULA TRACT;  Surgeon: Gust Thedora Blumenthal, MD;  [facility];  Service: Otolaryngology Head and Neck;  Laterality: N/A;  . TONSILLECTOMY & ADENOIDECTOMY     CHILDHOOD   Family History  Problem Relation Age of Onset  . Lung cancer Mother   . Kidney failure Father   . Heart disease Father   . No Known Problems Sister   . High blood pressure (Hypertension) Brother   . No Known Problems Maternal Grandmother   . No Known Problems Maternal Grandfather   . High blood pressure (Hypertension) Paternal Grandmother   . No Known Problems Paternal Grandfather   . No Known Problems Maternal Aunt   . No Known Problems Maternal Uncle   . No Known Problems Paternal Aunt   . No Known Problems Paternal Uncle   . Allergies Neg Hx   . Anesthesia problems Neg Hx   . Asthma Neg Hx   .  Cancer Neg Hx   . Clotting disorder Neg Hx   . Diabetes Neg Hx   . Hearing loss Neg Hx   . Neurological disorder Neg Hx   . Thyroid disease Neg Hx   . Malignant hyperthermia Neg Hx   . Malignant hypertension Neg Hx   . PONV Neg Hx   . Pseudochol deficiency Neg Hx    Social History   Socioeconomic History  . Marital status: Life Partner    Spouse name: Oneil Pacini   Occupational History  . Occupation: disabled  Tobacco Use  . Smoking status: Former    Current packs/day: 0.00    Average packs/day: 0.5 packs/day for 25.0 years (12.5 ttl pk-yrs)    Types: Cigarettes    Start date: 01/22/1990    Quit date: 01/22/2015    Years since quitting: 8.6  . Smokeless tobacco: Never  Vaping Use  . Vaping status: Never Used  Substance and Sexual Activity  . Alcohol use: No  . Drug use: Not Currently    Comment: Last used cocaine 01/22/2015  . Sexual activity: Defer   Social Determinants of Health   Food Insecurity: No Food Insecurity (07/05/2023)   Received from [facility]   Hunger Vital Sign   . Worried About Programme researcher, broadcasting/film/video in the Last Year: Never true   . Ran Out of Food in the Last Year: Never true  Transportation Needs: No Transportation Needs (07/05/2023)   Received from [facility] - Transportation   . Lack of Transportation (Medical): No   . Lack of Transportation (Non-Medical): No   Review of Systems  Constitutional:  Positive for fever. Negative for activity change and chills.  HENT:  Negative for congestion, rhinorrhea and sore throat.   Respiratory:  Positive for cough and shortness of breath. Negative for chest tightness.   Cardiovascular:  Negative for chest pain.  Gastrointestinal:  Positive for constipation. Negative for abdominal pain, diarrhea, nausea and vomiting.  Endocrine: Negative for polyuria.  Genitourinary:  Negative for difficulty urinating, dysuria, frequency and vaginal discharge.  Musculoskeletal:        Pain and swelling in left foot.  Skin:  Negative for rash.  Neurological:  Negative for dizziness and headaches.  All other systems reviewed and are negative.   Physical Exam  BP (!) 164/78   Pulse 82   Resp 18   SpO2 99%  Physical Exam Vitals and nursing note reviewed.  Constitutional:      General: She is not in acute distress.    Appearance: Normal appearance. She is ill-appearing. She is not  diaphoretic.  HENT:     Head: Normocephalic and atraumatic.     Right Ear: External ear normal.     Left Ear: External ear normal.     Nose: Nose normal. No congestion.     Mouth/Throat:     Pharynx: Oropharynx is clear.  Eyes:     Extraocular Movements: Extraocular movements intact.     Pupils: Pupils are equal, round, and reactive to light.  Cardiovascular:     Rate and Rhythm: Normal rate and regular rhythm.     Pulses: Normal pulses.     Heart sounds: Normal heart sounds. No murmur heard.    No friction rub. No gallop.  Pulmonary:     Effort: No respiratory distress.     Breath sounds: No stridor. No wheezing or rales.     Comments: Tachypneic to 20s. Trach collar in place with 6Fr  Shiley XLT. On 15L O2 to resus. Decreased to 6L O2 and satting 99%. Decreased breath sounds bilaterally. Abdominal:     Palpations: Abdomen is soft.  Musculoskeletal:     Cervical back: Normal range of motion.     Right lower leg: No edema.     Left lower leg: Edema present.  Skin:    General: Skin is warm and dry.  Neurological:     Mental Status: She is alert. Mental status is at baseline.     Procedures  Procedures   Medical Decision Making   Medical Complexity:    New and requires workup.     Pertinent labs & imaging results that were available during my care of the patient were reviewed by me and considered in my medical decision making.     I obtained history from someone other than the patient.         EMS   I reviewed previous medical records.         Last discharge summary    I independently visualized image(s), tracing(s), and/or specimen(s).         CXR   I discussed the patient with another provider.         ENT  Patient is a 47yo female who is trach dependent who developed acute onset SOB prior to presentation with new O2 requirements. Patient stable on arrival, satting 99% on 15L O2, BP 160/70s-80s, HR 80s, temp 99.4F. No wheezing, unlikely asthma exacerbation. Also no  rales or rhonchi suggestive of CHF exacerbation.   pH 7.35, pCO2 66, pO2 39, lactate 1.0 on shock. Suspect chronic CO2 retention iso hypoventilation syndrome. Initial trop elevated to 250, but normal lactate 1.0 and stable EKGs from prior with lower c/f ischemia at this time. WBC 12.3, could be c/w inflammation vs infection, could also reflect chronic inflammatory processes vs acute process. Patient has frequent urination, could reflect DM vs new UTI. UA ordered for further eval. Crt 1.4, up from 1.1-1.2 baseline. Unclear etiology of AI at this time. COVID neg.   CT PE ordered to evaluate for PE. Patient not on home [facility], not very active at baseline, and sudden onset makes this more likely. CXR showing small BL pleural effusions, likely reflective of CHF exacerbation. Lower c/f volume overload at this time, but could consider diuresis if symptoms persist. No evidence of pneumoniae or other respiratory tract infections at this time, but could be brewing given elevated WBC and new O2 requirements.   Dispo pending additional workup, but given patient's ongoing higher O2 requirements, patient will likely need admission for further management until back to baseline. Unfortunately, patient left AMA prior to further evaluation and workup.  She underwent CT PE, but did not wait until study resulted.  A trial without oxygen was performed prior to her leaving, with resulting drop in O2 to upper 80s.  A thorough AMA discussion was had including risk and benefits with the patient and her fianc at bedside.  Patient understood potential risks of leaving AMA.  Patient was discharged AMA with instructions to increase Lasix  for 3 days, follow-up with primary care provider for electrolyte check, and to return to the emergency department if anything worsens.  Differential: ACS, CHF exacerbation, pneumoniae vs LRI or other URI, UTI, PE, tracheostomy obstruction vs dislodgement, asthma exacerbation   ED Course as of 09/09/23  1824  Fri Sep 09, 2023  1501 X-ray chest single view portable Impression: 1. Stable enlarged cardiac silhouette. Mediastinal contours likely stable  allowing for differences in patient rotation. 2. No significant change in mild diffuse pulmonary opacities, most likely edema. 3. Probable small bilateral pleural effusions. 4. Tracheostomy tube remaining in place.  [NA]  1501 pH, Venous: 7.35 [NA]  1501 PCO2, Venous(!): 66 [NA]  1501 PO2, Venous: 39 [NA]  1501 Lactate, Venous: 1.0 [NA]  1505 ENT evaluated the patient, will reach back out with plan for exchange [NA]  1519 Coronavirus (COVID-19) SARS-CoV-2 PCR: Not Detected [NA]  1536 Creatinine(!): 1.4 [VD]  1557 hsTnI Baseline: 250 [VD]  1557 ENT defers trach exchange at this time, they will work on rescheduling her. DO NOT move or adjust trach.  [VD]  1607 WBC(!): 12.3 [VD]  1607 Hemoglobin(!): 10.3 Baseline 10-11s [VD]  1634 WBC(!): 11.6 [VD]  1634 Hemoglobin(!): 9.6 [VD]  1644 Prothrombin INR: 1.1 [VD]  1708 hsTnI 1 hr: 248 [VD]  1710 Delta from baseline: 2 [VD]  1823 Urinalysis Chemical with Reflex to Microscopic Review(!) Non-infectious [VD]    ED Course User Index [NA] Angeles, Brad Ip, MD [VD] Dronzek, Richerd Pica, MD        ED Clinical Impression  1. Shortness of breath                 Angeles, Brad Ip, MD Resident 09/10/23 [phone]

## 2023-09-09 NOTE — ED Provider Notes (Signed)
[facility] Provider Note    Event Date/Time   First MD Initiated Contact with Patient 09/09/23 [phone]     (approximate)   History   Respiratory Distress   HPI  Sandra Holloway is a 47 y.o. 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  On the way they are started having worsening shortness of breath respiratory distress feeling like her trach was clogged.  EMS was called found the patient to be 50% some suction was performed with improvement in oxygenation.  She was placed on nonrebreather and brought to the ER.     Physical Exam   Triage Vital Signs: ED Triage Vitals  Encounter Vitals Group     BP --      Systolic BP Percentile --      Diastolic BP Percentile --      Pulse Rate 09/09/23 0845 (!) 108     Resp 09/09/23 0845 (!) 24     Temp --      Temp src --      SpO2 09/09/23 0844 95 %     Weight --      Height --      Head Circumference --      Peak Flow --      Pain Score --      Pain Loc --      Pain Education --      Exclude from Growth Chart --     Most recent vital signs: Vitals:   09/09/23 0930 09/09/23 1011  BP: 130/77   Pulse: 85 78  Resp:    Temp:    SpO2: 98% 98%     Constitutional: Alert  Eyes: Conjunctivae are normal.  Head: Atraumatic. Nose: No congestion/rhinnorhea. Mouth/Throat: Mucous membranes are moist.   Neck: Painless ROM.  Cardiovascular:   Good peripheral circulation. Respiratory: mild tachypnea, diminished bs throughout but equal Gastrointestinal: Soft and nontender.  Musculoskeletal:  no deformity Neurologic:  MAE spontaneously. No gross focal neurologic deficits are appreciated.  Skin:  Skin is warm, dry and intact. No rash noted. Psychiatric: Mood and affect are normal. Speech and behavior are normal.    ED Results / Procedures / Treatments   Labs (all labs ordered are  listed, but only abnormal results are displayed) Labs Reviewed  CBC WITH DIFFERENTIAL/PLATELET - Abnormal; Notable for the following components:      Result Value   WBC 13.3 (*)    RBC 3.83 (*)    Hemoglobin 10.1 (*)    HCT 32.5 (*)    RDW 15.6 (*)    Neutro Abs 10.6 (*)    Abs Immature Granulocytes 0.09 (*)    All other components within normal limits  BASIC METABOLIC PANEL - Abnormal; Notable for the following components:   Glucose, Bld 150 (*)    Creatinine, Ser 1.20 (*)    Calcium 8.6 (*)    GFR, Estimated 56 (*)    All other components within normal limits     EKG     RADIOLOGY Please see ED Course for my review and interpretation.  I personally reviewed all radiographic images ordered to evaluate for the above acute complaints and reviewed radiology reports and findings.  These findings were personally discussed with the patient.  Please see medical record for radiology report.    PROCEDURES:  Critical Care performed: No  Procedures   MEDICATIONS ORDERED IN ED: Medications  ipratropium-albuterol (DUONEB) 0.5-2.5 (3) MG/3ML nebulizer solution 3 mL (3 mLs Nebulization Given 09/09/23 0856)  furosemide (LASIX) injection 40 mg (40 mg Intravenous Given 09/09/23 1046)  doxycycline (VIBRA-TABS) tablet 100 mg (100 mg Oral Given 09/09/23 1054)     IMPRESSION / MDM / ASSESSMENT AND PLAN / ED COURSE  I reviewed the triage vital signs and the nursing notes.                              Differential diagnosis includes, but is not limited to, Asthma, copd, CHF, pna, ptx, malignancy, Pe, anemia  Patient presenting to the ER for evaluation of symptoms as described above.  Based on symptoms, risk factors and considered above differential, this presenting complaint could reflect a potentially life-threatening illness therefore the patient will be placed on continuous pulse oximetry and telemetry for monitoring.  Laboratory evaluation will be sent to evaluate for the above  complaints.  Based on presentation improvement with deep suctioning I do suspect mucous plugging in the setting of trach that was planned for outpatient exchange today.  Will observe patient will give nebulizer treatment chest x-ray ordered.   Clinical Course as of 09/09/23 1203  Fri Sep 09, 2023  1610 Vital signs remained stable.  Patient satting 99%. [PR]  1018 I have reached out in consultation to do because she was planned for OR trach exchange today.  At this point she does appear clinically stable for transfer. [PR]  1201 Patient has been accepted and transferred to [facility] ER to ER.  She is stable for transfer. [PR]    Clinical Course User Index [PR] Willy Eddy, MD     FINAL CLINICAL IMPRESSION(S) / ED DIAGNOSES   Final diagnoses:  Hypoxia     Rx / DC Orders   ED Discharge Orders     None        Note:  This document was prepared using Dragon voice recognition software and may include unintentional dictation errors.    Willy Eddy, MD 09/09/23 [phone]

## 2023-09-09 NOTE — ED Notes (Signed)
[facility]  CALLED 

## 2023-09-10 NOTE — ED Provider Notes (Signed)
 Emergency Department AMA/LWBS Disposition Review  ED Visit: 09/09/2023  Record reviewed: 09/10/2023 7:28 AM.   Abnormal Findings:  47 year old female presented with respiratory distress, new oxygen requirement and elevated troponins.  Patient signed AMA form was included the risk and benefits but patient elected to leave AMA  Follow-up:  Patient will benefit from either return to the emergency room for admission and oxygen supplement, refuse follow-up with primary care/cardiology team for further evaluation and repeat labs  ED Resource RN notified to contact patient/guardian regarding abnormal findings.   Lahav-Vitenzon, Sky, PA 09/10/23 0732

## 2024-01-31 NOTE — ED Provider Notes (Signed)
LWBS/AMA Patient Follow-Up Call  Chart reviewed after patient LWBS after triage. Patient has since been evaluated by another ED, per chart documentation. No further actions needed.

## 2024-03-06 ENCOUNTER — Emergency Department

## 2024-03-06 ENCOUNTER — Inpatient Hospital Stay

## 2024-03-06 ENCOUNTER — Other Ambulatory Visit: Payer: Self-pay

## 2024-03-06 ENCOUNTER — Inpatient Hospital Stay
Admission: EM | Admit: 2024-03-06 | Discharge: 2024-03-09 | DRG: 871 | Disposition: A | Discharge status: Home / Self Care | Attending: Internal Medicine | Admitting: Internal Medicine

## 2024-03-06 DIAGNOSIS — A419 Sepsis, unspecified organism: Principal | ICD-10-CM | POA: Diagnosis present

## 2024-03-06 DIAGNOSIS — L03311 Cellulitis of abdominal wall: Secondary | ICD-10-CM | POA: Diagnosis present

## 2024-03-06 DIAGNOSIS — J9621 Acute and chronic respiratory failure with hypoxia: Secondary | ICD-10-CM | POA: Diagnosis present

## 2024-03-06 DIAGNOSIS — J4520 Mild intermittent asthma, uncomplicated: Secondary | ICD-10-CM | POA: Diagnosis present

## 2024-03-06 DIAGNOSIS — R7303 Prediabetes: Secondary | ICD-10-CM

## 2024-03-06 DIAGNOSIS — N1832 Chronic kidney disease, stage 3b: Secondary | ICD-10-CM | POA: Diagnosis present

## 2024-03-06 DIAGNOSIS — I130 Hypertensive heart and chronic kidney disease with heart failure and stage 1 through stage 4 chronic kidney disease, or unspecified chronic kidney disease: Secondary | ICD-10-CM | POA: Diagnosis present

## 2024-03-06 DIAGNOSIS — E785 Hyperlipidemia, unspecified: Secondary | ICD-10-CM | POA: Diagnosis present

## 2024-03-06 DIAGNOSIS — J386 Stenosis of larynx: Secondary | ICD-10-CM | POA: Diagnosis present

## 2024-03-06 DIAGNOSIS — Z87891 Personal history of nicotine dependence: Secondary | ICD-10-CM

## 2024-03-06 DIAGNOSIS — I5033 Acute on chronic diastolic (congestive) heart failure: Secondary | ICD-10-CM | POA: Diagnosis present

## 2024-03-06 DIAGNOSIS — M793 Panniculitis, unspecified: Secondary | ICD-10-CM | POA: Diagnosis present

## 2024-03-06 DIAGNOSIS — Z79899 Other long term (current) drug therapy: Secondary | ICD-10-CM | POA: Diagnosis not present

## 2024-03-06 DIAGNOSIS — J4521 Mild intermittent asthma with (acute) exacerbation: Secondary | ICD-10-CM

## 2024-03-06 DIAGNOSIS — I5031 Acute diastolic (congestive) heart failure: Secondary | ICD-10-CM | POA: Diagnosis not present

## 2024-03-06 DIAGNOSIS — Z930 Tracheostomy status: Secondary | ICD-10-CM | POA: Diagnosis not present

## 2024-03-06 DIAGNOSIS — Z6841 Body Mass Index (BMI) 40.0 and over, adult: Secondary | ICD-10-CM

## 2024-03-06 LAB — CBC WITH DIFFERENTIAL/PLATELET
Abs Immature Granulocytes: 0.07 10*3/uL (ref 0.00–0.07)
Basophils Absolute: 0 10*3/uL (ref 0.0–0.1)
Basophils Relative: 0 %
Eosinophils Absolute: 0.1 10*3/uL (ref 0.0–0.5)
Eosinophils Relative: 0 %
HCT: 32.6 % — ABNORMAL LOW (ref 36.0–46.0)
Hemoglobin: 10.3 g/dL — ABNORMAL LOW (ref 12.0–15.0)
Immature Granulocytes: 0 %
Lymphocytes Relative: 9 %
Lymphs Abs: 1.4 10*3/uL (ref 0.7–4.0)
MCH: 26.3 pg (ref 26.0–34.0)
MCHC: 31.6 g/dL (ref 30.0–36.0)
MCV: 83.4 fL (ref 80.0–100.0)
Monocytes Absolute: 0.4 10*3/uL (ref 0.1–1.0)
Monocytes Relative: 3 %
Neutro Abs: 13.9 10*3/uL — ABNORMAL HIGH (ref 1.7–7.7)
Neutrophils Relative %: 88 %
Platelets: 222 10*3/uL (ref 150–400)
RBC: 3.91 MIL/uL (ref 3.87–5.11)
RDW: 14.9 % (ref 11.5–15.5)
WBC: 15.8 10*3/uL — ABNORMAL HIGH (ref 4.0–10.5)
nRBC: 0 % (ref 0.0–0.2)

## 2024-03-06 LAB — BLOOD GAS, VENOUS
Acid-Base Excess: 1.6 mmol/L (ref 0.0–2.0)
Bicarbonate: 29.2 mmol/L — ABNORMAL HIGH (ref 20.0–28.0)
O2 Saturation: 57.9 %
Patient temperature: 37
pCO2, Ven: 58 mmHg (ref 44–60)
pH, Ven: 7.31 (ref 7.25–7.43)
pO2, Ven: 37 mmHg (ref 32–45)

## 2024-03-06 LAB — COMPREHENSIVE METABOLIC PANEL WITH GFR
ALT: 13 U/L (ref 0–44)
AST: 17 U/L (ref 15–41)
Albumin: 4 g/dL (ref 3.5–5.0)
Alkaline Phosphatase: 69 U/L (ref 38–126)
Anion gap: 8 (ref 5–15)
BUN: 22 mg/dL — ABNORMAL HIGH (ref 6–20)
CO2: 27 mmol/L (ref 22–32)
Calcium: 8.9 mg/dL (ref 8.9–10.3)
Chloride: 103 mmol/L (ref 98–111)
Creatinine, Ser: 1.47 mg/dL — ABNORMAL HIGH (ref 0.44–1.00)
GFR, Estimated: 44 mL/min — ABNORMAL LOW (ref >60)
Glucose, Bld: 159 mg/dL — ABNORMAL HIGH (ref 70–99)
Potassium: 3.6 mmol/L (ref 3.5–5.1)
Sodium: 138 mmol/L (ref 135–145)
Total Bilirubin: 0.6 mg/dL (ref 0.0–1.2)
Total Protein: 7.9 g/dL (ref 6.5–8.1)

## 2024-03-06 LAB — URINALYSIS, W/ REFLEX TO CULTURE (INFECTION SUSPECTED)
Bacteria, UA: NONE SEEN
Bilirubin Urine: NEGATIVE
Glucose, UA: NEGATIVE mg/dL
Ketones, ur: NEGATIVE mg/dL
Leukocytes,Ua: NEGATIVE
Nitrite: NEGATIVE
Protein, ur: 30 mg/dL — AB
Specific Gravity, Urine: 1.024 (ref 1.005–1.030)
pH: 5 (ref 5.0–8.0)

## 2024-03-06 LAB — PROTIME-INR
INR: 1.1 (ref 0.8–1.2)
Prothrombin Time: 14.7 s (ref 11.4–15.2)

## 2024-03-06 LAB — PROCALCITONIN: Procalcitonin: <0.1 ng/mL

## 2024-03-06 LAB — RESP PANEL BY RT-PCR (RSV, FLU A&B, COVID)  RVPGX2
Influenza A by PCR: NEGATIVE
Influenza B by PCR: NEGATIVE
Resp Syncytial Virus by PCR: NEGATIVE
SARS Coronavirus 2 by RT PCR: NEGATIVE

## 2024-03-06 LAB — GLUCOSE, CAPILLARY: Glucose-Capillary: 167 mg/dL — ABNORMAL HIGH (ref 70–99)

## 2024-03-06 LAB — LACTIC ACID, PLASMA: Lactic Acid, Venous: 1.4 mmol/L (ref 0.5–1.9)

## 2024-03-06 LAB — HCG, QUANTITATIVE, PREGNANCY: hCG, Beta Chain, Quant, S: 1 m[IU]/mL (ref <5)

## 2024-03-06 MED ORDER — IPRATROPIUM-ALBUTEROL 0.5-2.5 (3) MG/3ML IN SOLN: 3.0000 mL | Freq: Four times a day (QID) | RESPIRATORY_TRACT | Status: DC | PRN

## 2024-03-06 MED ORDER — FENTANYL CITRATE PF 50 MCG/ML IJ SOSY
50.0000 ug | PREFILLED_SYRINGE | Freq: Once | INTRAMUSCULAR | Status: AC
  Administered 2024-03-06: 50 ug via INTRAVENOUS
  Filled 2024-03-06: qty 1

## 2024-03-06 MED ORDER — VANCOMYCIN HCL 1500 MG/300ML IV SOLN
1500.0000 mg | INTRAVENOUS | Status: DC
  Administered 2024-03-07 – 2024-03-08 (×2): 1500 mg via INTRAVENOUS
  Filled 2024-03-06 (×2): qty 300

## 2024-03-06 MED ORDER — DULOXETINE HCL 30 MG PO CPEP: 30.0000 mg | ORAL_CAPSULE | Freq: Every day | ORAL | Status: DC

## 2024-03-06 MED ORDER — VANCOMYCIN HCL IN DEXTROSE 1-5 GM/200ML-% IV SOLN
1000.0000 mg | Freq: Once | INTRAVENOUS | Status: AC
  Administered 2024-03-06: 1000 mg via INTRAVENOUS
  Filled 2024-03-06: qty 200

## 2024-03-06 MED ORDER — GUAIFENESIN ER 600 MG PO TB12
1200.0000 mg | ORAL_TABLET | Freq: Two times a day (BID) | ORAL | Status: DC
  Administered 2024-03-06 – 2024-03-09 (×7): 1200 mg via ORAL
  Filled 2024-03-06 (×7): qty 2

## 2024-03-06 MED ORDER — METRONIDAZOLE 500 MG/100ML IV SOLN
500.0000 mg | Freq: Once | INTRAVENOUS | Status: AC
  Administered 2024-03-06: 500 mg via INTRAVENOUS
  Filled 2024-03-06: qty 100

## 2024-03-06 MED ORDER — SODIUM CHLORIDE 0.9% FLUSH
3.0000 mL | Freq: Two times a day (BID) | INTRAVENOUS | Status: DC
  Administered 2024-03-06 – 2024-03-09 (×7): 3 mL via INTRAVENOUS

## 2024-03-06 MED ORDER — MORPHINE SULFATE (PF) 4 MG/ML IV SOLN
4.0000 mg | Freq: Once | INTRAVENOUS | Status: AC
  Administered 2024-03-06: 4 mg via INTRAVENOUS
  Filled 2024-03-06: qty 1

## 2024-03-06 MED ORDER — SODIUM CHLORIDE 0.9 % IV SOLN
2.0000 g | Freq: Once | INTRAVENOUS | Status: AC
  Administered 2024-03-06: 2 g via INTRAVENOUS
  Filled 2024-03-06: qty 12.5

## 2024-03-06 MED ORDER — LORATADINE 10 MG PO TABS
10.0000 mg | ORAL_TABLET | Freq: Every day | ORAL | Status: DC
  Administered 2024-03-06 – 2024-03-09 (×4): 10 mg via ORAL
  Filled 2024-03-06 (×4): qty 1

## 2024-03-06 MED ORDER — PREGABALIN 25 MG PO CAPS
25.0000 mg | ORAL_CAPSULE | Freq: Two times a day (BID) | ORAL | Status: DC
  Administered 2024-03-06 – 2024-03-09 (×7): 25 mg via ORAL
  Filled 2024-03-06 (×7): qty 1

## 2024-03-06 MED ORDER — PANTOPRAZOLE SODIUM 40 MG PO TBEC
40.0000 mg | DELAYED_RELEASE_TABLET | Freq: Every day | ORAL | Status: DC
  Administered 2024-03-06 – 2024-03-09 (×4): 40 mg via ORAL
  Filled 2024-03-06 (×4): qty 1

## 2024-03-06 MED ORDER — ACETAMINOPHEN 325 MG PO TABS: 650.0000 mg | ORAL_TABLET | ORAL | Status: DC | PRN

## 2024-03-06 MED ORDER — ONDANSETRON HCL 4 MG/2ML IJ SOLN
4.0000 mg | Freq: Once | INTRAMUSCULAR | Status: AC
  Administered 2024-03-06: 4 mg via INTRAVENOUS
  Filled 2024-03-06: qty 2

## 2024-03-06 MED ORDER — CYCLOBENZAPRINE HCL 10 MG PO TABS
5.0000 mg | ORAL_TABLET | Freq: Every day | ORAL | Status: DC
  Administered 2024-03-06 – 2024-03-08 (×3): 5 mg via ORAL
  Filled 2024-03-06 (×3): qty 1

## 2024-03-06 MED ORDER — ACETAMINOPHEN 500 MG PO TABS
1000.0000 mg | ORAL_TABLET | Freq: Once | ORAL | Status: AC
  Administered 2024-03-06: 1000 mg via ORAL
  Filled 2024-03-06: qty 2

## 2024-03-06 MED ORDER — LACTATED RINGERS IV BOLUS (SEPSIS)
1000.0000 mL | Freq: Once | INTRAVENOUS | Status: AC
  Administered 2024-03-06: 1000 mL via INTRAVENOUS

## 2024-03-06 MED ORDER — OXYCODONE-ACETAMINOPHEN 10-325 MG PO TABS: 1.0000 | ORAL_TABLET | ORAL | Status: DC | PRN

## 2024-03-06 MED ORDER — VANCOMYCIN HCL 1500 MG/300ML IV SOLN
1500.0000 mg | Freq: Once | INTRAVENOUS | Status: AC
  Administered 2024-03-06: 1500 mg via INTRAVENOUS
  Filled 2024-03-06: qty 300

## 2024-03-06 MED ORDER — FUROSEMIDE 10 MG/ML IJ SOLN
60.0000 mg | Freq: Two times a day (BID) | INTRAMUSCULAR | Status: DC
  Administered 2024-03-06 – 2024-03-07 (×4): 60 mg via INTRAVENOUS
  Filled 2024-03-06: qty 8
  Filled 2024-03-06: qty 6
  Filled 2024-03-06: qty 8
  Filled 2024-03-06: qty 6
  Filled 2024-03-06: qty 8

## 2024-03-06 MED ORDER — OXYCODONE HCL ER 15 MG PO T12A
15.0000 mg | EXTENDED_RELEASE_TABLET | Freq: Two times a day (BID) | ORAL | Status: DC
  Administered 2024-03-06 – 2024-03-09 (×7): 15 mg via ORAL
  Filled 2024-03-06 (×7): qty 1

## 2024-03-06 MED ORDER — SERTRALINE HCL 50 MG PO TABS
100.0000 mg | ORAL_TABLET | Freq: Every day | ORAL | Status: DC
  Administered 2024-03-06 – 2024-03-09 (×4): 100 mg via ORAL
  Filled 2024-03-06 (×4): qty 2

## 2024-03-06 MED ORDER — SODIUM CHLORIDE 0.9 % IV SOLN
2.0000 g | Freq: Three times a day (TID) | INTRAVENOUS | Status: DC
  Administered 2024-03-06 – 2024-03-08 (×6): 2 g via INTRAVENOUS
  Filled 2024-03-06 (×7): qty 12.5

## 2024-03-06 MED ORDER — IPRATROPIUM-ALBUTEROL 0.5-2.5 (3) MG/3ML IN SOLN
3.0000 mL | RESPIRATORY_TRACT | Status: AC
Start: 2024-03-06 — End: 2024-03-06
  Administered 2024-03-06 (×3): 3 mL via RESPIRATORY_TRACT
  Filled 2024-03-06: qty 3
  Filled 2024-03-06: qty 6

## 2024-03-06 MED ORDER — ONDANSETRON HCL 4 MG/2ML IJ SOLN: 4.0000 mg | Freq: Four times a day (QID) | INTRAMUSCULAR | Status: DC | PRN

## 2024-03-06 MED ORDER — METHYLPREDNISOLONE SODIUM SUCC 125 MG IJ SOLR
125.0000 mg | Freq: Once | INTRAMUSCULAR | Status: AC
  Administered 2024-03-06: 125 mg via INTRAVENOUS
  Filled 2024-03-06: qty 2

## 2024-03-06 MED ORDER — SODIUM CHLORIDE 0.9 % IV SOLN: 250.0000 mL | INTRAVENOUS | Status: AC | PRN

## 2024-03-06 MED ORDER — OXYCODONE HCL 5 MG PO TABS
5.0000 mg | ORAL_TABLET | ORAL | Status: DC | PRN
  Administered 2024-03-06 – 2024-03-09 (×8): 5 mg via ORAL
  Filled 2024-03-06 (×8): qty 1

## 2024-03-06 MED ORDER — HYDROMORPHONE HCL 1 MG/ML IJ SOLN
0.5000 mg | INTRAMUSCULAR | Status: DC | PRN
  Administered 2024-03-06 – 2024-03-09 (×14): 0.5 mg via INTRAVENOUS
  Filled 2024-03-06 (×14): qty 0.5

## 2024-03-06 MED ORDER — ENOXAPARIN SODIUM 100 MG/ML IJ SOSY
90.0000 mg | PREFILLED_SYRINGE | INTRAMUSCULAR | Status: DC
  Administered 2024-03-07 – 2024-03-08 (×2): 90 mg via SUBCUTANEOUS
  Filled 2024-03-06 (×4): qty 1

## 2024-03-06 MED ORDER — SODIUM CHLORIDE 0.9% FLUSH: 3.0000 mL | INTRAVENOUS | Status: DC | PRN

## 2024-03-06 MED ORDER — OXYCODONE-ACETAMINOPHEN 5-325 MG PO TABS
1.0000 | ORAL_TABLET | ORAL | Status: DC | PRN
  Administered 2024-03-06 – 2024-03-09 (×8): 1 via ORAL
  Filled 2024-03-06 (×8): qty 1

## 2024-03-06 MED ORDER — IOHEXOL 350 MG/ML SOLN
80.0000 mL | Freq: Once | INTRAVENOUS | Status: AC | PRN
  Administered 2024-03-06: 80 mL via INTRAVENOUS

## 2024-03-06 NOTE — Plan of Care (Signed)

## 2024-03-06 NOTE — ED Provider Notes (Signed)
 [facility] Provider Note    Event Date/Time   First MD Initiated Contact with Patient 03/06/24 0140     (approximate)   History   Shortness of Breath   HPI  Sandra Holloway is a 48 y.o. 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  Found to have oral temperature of 100.5 and was tachycardic, tachypneic.  Patient normally on 6 L through trach collar.  No hypoxia on this but EMS did increase her oxygen for comfort.  No vomiting, diarrhea, urinary symptoms.   History provided by patient, EMS.    Past Medical History:  Diagnosis Date   Dyspnea    Hypertension     Past Surgical History:  Procedure Laterality Date   TRACHEOSTOMY      MEDICATIONS:  Prior to Admission medications   Medication Sig Start Date End Date Taking? Authorizing Provider  acetaminophen (TYLENOL) 325 MG tablet Take 2 tablets (650 mg total) by mouth every 6 (six) hours as needed for mild pain (or Fever >/= 101). Patient not taking: Reported on 07/05/2023 03/30/22   Lurene Shadow, MD  albuterol (VENTOLIN HFA) 108 (90 Base) MCG/ACT inhaler Inhale 2 puffs into the lungs every 6 (six) hours as needed for wheezing or shortness of breath. Patient not taking: Reported on 11/03/2022 10/21/22   Tresa Moore, MD  cyclobenzaprine (FLEXERIL) 5 MG tablet Take 5 mg by mouth at bedtime.    [provider]  DULoxetine (CYMBALTA) 30 MG capsule Take 30 mg by mouth at bedtime. 06/02/23   [provider]  furosemide (LASIX) 40 MG tablet Take 40 mg by mouth daily.    [provider]  ipratropium-albuterol (DUONEB) 0.5-2.5 (3) MG/3ML SOLN Take 3 mLs by nebulization every 6 (six) hours as needed. 10/21/22   Tresa Moore, MD  loratadine (CLARITIN) 10 MG tablet Take 10 mg by mouth daily.     [provider]  omeprazole (PRILOSEC) 20 MG capsule Take 20 mg by mouth daily.    [provider]  oxyCODONE-acetaminophen (PERCOCET) 10-325 MG tablet Take 1 tablet by mouth every 4 (four) hours as needed. 06/15/23   [provider]  pregabalin (LYRICA) 25 MG capsule Take 25 mg by mouth 2 (two) times daily. 06/15/23   [provider]  triamcinolone cream (KENALOG) 0.1 % Apply 1 Application topically 2 (two) times daily. 11/09/22   Wouk, Wilfred Curtis, MD  XTAMPZA ER 9 MG C12A Take 1 capsule by mouth at bedtime. 06/16/23   [provider]    Physical Exam   Triage Vital Signs: ED Triage Vitals  Encounter Vitals Group     BP --      Systolic BP Percentile --      Diastolic BP Percentile --      Pulse Rate 03/06/24 0148 (!) 108     Resp 03/06/24 0148 (!) 26     Temp 03/06/24 0148 (!) 103 F (39.4 C)     Temp Source 03/06/24 0148 Oral     SpO2 03/06/24 0148 100 %     Weight 03/06/24 0151 (!) 395 lb 4.6 oz (179.3 kg)     Height --      Head Circumference --      Peak Flow --      Pain Score 03/06/24 0151 10     Pain Loc --  Pain Education --      Exclude from Growth Chart --     Most recent vital signs: Vitals:   03/06/24 0430 03/06/24 0600  BP:  136/75  Pulse:  80  Resp:  20  Temp: (!) 102.8 F (39.3 C)   SpO2:  99%    CONSTITUTIONAL: Alert, responds appropriately to questions. Well-appearing; well-nourished HEAD: Normocephalic, atraumatic EYES: Conjunctivae clear, pupils appear equal, sclera nonicteric ENT: normal nose; moist mucous membranes NECK: Supple, normal ROM, tracheostomy in place with trach collar CARD: RRR; S1 and S2 appreciated RESP: Patient has increased work of breathing with diminished aeration at bases bilaterally.  Inspiratory and expiratory wheezes heard diffusely.  No rhonchi or rales.  Speaking in truncated sentences. ABD/GI: Non-distended; soft, tender throughout the lower abdomen with significant  erythema, warmth and induration to the lower abdomen, pannus without fluctuance or crepitus BACK: The back appears normal EXT: Normal ROM in all joints; no deformity noted, no edema SKIN: Normal color for age and race; warm; no rash on exposed skin NEURO: Moves all extremities equally, normal speech PSYCH: The patient's mood and manner are appropriate.      Patient gave verbal permission to utilize photo for medical documentation only. The image was not stored on any personal device.  ED Results / Procedures / Treatments   LABS: (all labs ordered are listed, but only abnormal results are displayed) Labs Reviewed  COMPREHENSIVE METABOLIC PANEL WITH GFR - Abnormal; Notable for the following components:      Result Value   Glucose, Bld 159 (*)    BUN 22 (*)    Creatinine, Ser 1.47 (*)    GFR, Estimated 44 (*)    All other components within normal limits  CBC WITH DIFFERENTIAL/PLATELET - Abnormal; Notable for the following components:   WBC 15.8 (*)    Hemoglobin 10.3 (*)    HCT 32.6 (*)    Neutro Abs 13.9 (*)    All other components within normal limits  URINALYSIS, W/ REFLEX TO CULTURE (INFECTION SUSPECTED) - Abnormal; Notable for the following components:   Color, Urine YELLOW (*)    APPearance HAZY (*)    Hgb urine dipstick SMALL (*)    Protein, ur 30 (*)    All other components within normal limits  BLOOD GAS, VENOUS - Abnormal; Notable for the following components:   Bicarbonate 29.2 (*)    All other components within normal limits  RESP PANEL BY RT-PCR (RSV, FLU A&B, COVID)  RVPGX2  CULTURE, BLOOD (ROUTINE X 2)  CULTURE, BLOOD (ROUTINE X 2)  LACTIC ACID, PLASMA  PROTIME-INR  HCG, QUANTITATIVE, PREGNANCY  PROCALCITONIN     EKG:  EKG Interpretation Date/Time:  Tuesday March 06 2024 01:49:01 EDT Ventricular Rate:  109 PR Interval:    QRS Duration:  94 QT Interval:  319 QTC Calculation: 430 R Axis:   53  Text Interpretation: Sinus tachycardia Abnormal R-wave  progression, early transition Abnormal T, consider ischemia, diffuse leads Confirmed by Rochele Raring [phone]) on 03/06/2024 2:27:16 AM         RADIOLOGY: My personal review and interpretation of imaging: CT of the abdomen pelvis shows panniculitis but no abscess or gas.  Chest x-ray shows possible right lower lobe pneumonia.  I have personally reviewed all radiology reports.   CT ABDOMEN PELVIS W CONTRAST Result Date: 03/06/2024 CLINICAL DATA:  Sepsis and abdominal wall cellulitis, evaluate for abscess. EXAM: CT ABDOMEN AND PELVIS WITH CONTRAST TECHNIQUE: Multidetector CT imaging of the abdomen and  pelvis was performed using the standard protocol following bolus administration of intravenous contrast. RADIATION DOSE REDUCTION: This exam was performed according to the departmental dose-optimization program which includes automated exposure control, adjustment of the mA and/or kV according to patient size and/or use of iterative reconstruction technique. CONTRAST:  80mL OMNIPAQUE IOHEXOL 350 MG/ML SOLN COMPARISON:  09/29/2022 FINDINGS: Lower chest: Hazy and streaky density at the lung bases attributed atelectasis. Hepatobiliary: No focal liver abnormality.No evidence of biliary obstruction or stone. Pancreas: Unremarkable. Spleen: Unremarkable. Adrenals/Urinary Tract: Negative adrenals. Exophytic right renal lesion with ~ 40 Hounsfield unit densitometry, 2 cm in size which is stable. No hydronephrosis. Unremarkable bladder. Stomach/Bowel:  No obstruction. No visible bowel inflammation Vascular/Lymphatic: No acute vascular abnormality. No mass or adenopathy. Reproductive:No pathologic findings. Other: Subcutaneous reticulation with skin thickening at the lower abdominal wall, similar appearance was seen where partially covered previously. Small fatty umbilical hernia. Musculoskeletal: No acute abnormalities. IMPRESSION: 1. Panniculitis, chronic or recurrent when compared to 2023 CT, without abscess or soft  tissue gas. No evidence of intra-abdominal inflammation. 2. 2 cm right renal lesion with densitometry indeterminate between solid and cystic nature, but reassuring size stability since at least 2023. palpation ultrasound could be attempted to further evaluate. 3. Small fatty umbilical hernia. Electronically Signed   By: Tiburcio Pea M.D.   On: 03/06/2024 06:45   DG Chest Port 1 View Result Date: 03/06/2024 CLINICAL DATA:  Shortness of breath and lower abdominal cellulitis. EXAM: PORTABLE CHEST 1 VIEW COMPARISON:  September 09, 2023 FINDINGS: There is stable tracheostomy tube positioning. The cardiac silhouette is mildly enlarged and unchanged in size. There is mild prominence of the central pulmonary vasculature. Mild atelectasis and/or infiltrate is seen within the right lung base. No pleural effusion or pneumothorax is identified. The visualized skeletal structures are unremarkable. IMPRESSION: 1. Stable cardiomegaly with mild central pulmonary vascular congestion. 2. Mild right basilar atelectasis and/or infiltrate. Electronically Signed   By: Aram Candela M.D.   On: 03/06/2024 03:36     PROCEDURES:  Critical Care performed: Yes, see critical care procedure note(s)   CRITICAL CARE Performed by: Baxter Hire Talbert Trembath   Total critical care time: 30 minutes  Critical care time was exclusive of separately billable procedures and treating other patients.  Critical care was necessary to treat or prevent imminent or life-threatening deterioration.  Critical care was time spent personally by me on the following activities: development of treatment plan with patient and/or surrogate as well as nursing, discussions with consultants, evaluation of patient's response to treatment, examination of patient, obtaining history from patient or surrogate, ordering and performing treatments and interventions, ordering and review of laboratory studies, ordering and review of radiographic studies, pulse oximetry and  re-evaluation of patient's condition.   Marland Kitchen1-3 Lead EKG Interpretation  Performed by: Rubi Tooley, Layla Maw, DO Authorized by: Jaydy Fitzhenry, Layla Maw, DO     Interpretation: normal     ECG rate:  80   ECG rate assessment: normal     Rhythm: sinus rhythm     Ectopy: none     Conduction: normal       IMPRESSION / MDM / ASSESSMENT AND PLAN / ED COURSE  I reviewed the triage vital signs and the nursing notes.    Patient here with respiratory distress, code sepsis.  The patient is on the cardiac monitor to evaluate for evidence of arrhythmia and/or significant heart rate changes.   DIFFERENTIAL DIAGNOSIS (includes but not limited to):   Asthma exacerbation, pneumonia, PE, CHF, viral URI,  pneumothorax, abdominal wall cellulitis, underlying abscess, necrotizing fasciitis, bacteremia   Patient's presentation is most consistent with acute presentation with potential threat to life or bodily function.   PLAN: Will obtain labs, urine, cultures, chest x-ray, CT of the abdomen pelvis.  Will give IV fluids, broad-spectrum antibiotics, pain and nausea medicine.  Patient will need admission.  She is febrile to 103 here, tachycardic and tachypneic.  Will give Tylenol.   MEDICATIONS GIVEN IN ED: Medications  lactated ringers bolus 1,000 mL (0 mLs Intravenous Stopped 03/06/24 0643)  ceFEPIme (MAXIPIME) 2 g in sodium chloride 0.9 % 100 mL IVPB (0 g Intravenous Stopped 03/06/24 0242)  metroNIDAZOLE (FLAGYL) IVPB 500 mg (0 mg Intravenous Stopped 03/06/24 0331)  vancomycin (VANCOCIN) IVPB 1000 mg/200 mL premix (0 mg Intravenous Stopped 03/06/24 0643)  ipratropium-albuterol (DUONEB) 0.5-2.5 (3) MG/3ML nebulizer solution 3 mL (3 mLs Nebulization Given 03/06/24 0243)  methylPREDNISolone sodium succinate (SOLU-MEDROL) 125 mg/2 mL injection 125 mg (125 mg Intravenous Given 03/06/24 0204)  acetaminophen (TYLENOL) tablet 1,000 mg (1,000 mg Oral Given 03/06/24 0205)  fentaNYL (SUBLIMAZE) injection 50 mcg (50 mcg Intravenous Given  03/06/24 0203)  ondansetron (ZOFRAN) injection 4 mg (4 mg Intravenous Given 03/06/24 0202)  iohexol (OMNIPAQUE) 350 MG/ML injection 80 mL (80 mLs Intravenous Contrast Given 03/06/24 0521)  morphine (PF) 4 MG/ML injection 4 mg (4 mg Intravenous Given 03/06/24 0401)     ED COURSE: Patient's labs show leukocytosis of 15,000 with left shift.  Stable chronic kidney disease.  VBG reassuring.  Urine does not appear infected.  COVID, flu and RSV negative.  Chest x-ray reviewed and interpreted by myself and the radiologist and shows atelectasis versus infiltrate in the right lower lobe.  Procalcitonin though is negative.  CT abdomen pelvis shows panniculitis, no abscess or gas.   CONSULTS:  Consulted and discussed patient's case with hospitalist, Dr. Arville Care.  I have recommended admission and consulting physician agrees and will place admission orders.  Patient (and family if present) agree with this plan.   I reviewed all nursing notes, vitals, pertinent previous records.  All labs, EKGs, imaging ordered have been independently reviewed and interpreted by myself.    OUTSIDE RECORDS REVIEWED: Reviewed previous ENT notes.       FINAL CLINICAL IMPRESSION(S) / ED DIAGNOSES   Final diagnoses:  Acute sepsis (HCC)  Abdominal wall cellulitis  Exacerbation of intermittent asthma, unspecified asthma severity     Rx / DC Orders   ED Discharge Orders     None        Note:  This document was prepared using Dragon voice recognition software and may include unintentional dictation errors.   Euva Rundell, Layla Maw, DO 03/06/24 [phone]

## 2024-03-06 NOTE — Progress Notes (Signed)
 Pt taken to CT on 40% TC and returned to ED 3 without incident. Pt wanted to be placed back on ATC when back in room.

## 2024-03-06 NOTE — Progress Notes (Signed)
 Pt came in with SOB. Per EMS and pt we are not to touch her trach. Pt adamant that we not suction her, "she will suction herself." Pt let me place her on 40% ATC, pt let me give her breathing treatments. Pt informed me that she uses a vent at night and is not sure of the settings. Pt was placed on ventilator, settings were comfortable per pt.

## 2024-03-06 NOTE — Sepsis Progress Note (Signed)
 Following for sepsis monitoring ?

## 2024-03-06 NOTE — ED Notes (Signed)
 Patient requesting RT to come to bedside. RT called.

## 2024-03-06 NOTE — Progress Notes (Signed)
 Pharmacy Antibiotic Note  Sandra Holloway is a 48 y.o. female admitted on 03/06/2024 with cellulitis/Panniculitis,   Pharmacy has been consulted for Vancomycin and Cefepime dosing.  -Hx CKD, trach  Plan: Patient received Cefepime 2 gm IV x 1, Vancomycin 1000 mg x1 and Metronidazole 500mg  IV x 1 in ED  -Will order Vancomycin 1500mg  IV x 1 for a total loading dose of 2500mg  Will continue with Vancomycin 1500 mg IV Q 24 hrs. Goal AUC 400-550. Expected AUC: 472 SCr used: 1.47 Cmin 13.2 VD 0.5  BMI 72  -Will order Cefepime 2 gm IV q8h   Continue to follow for renal function, cultures, abx deescalation and length of therapy, etc    Height: 5\' 2"  (157.5 cm) Weight: (!) 179.3 kg (395 lb 4.6 oz) IBW/kg (Calculated) : 50.1  Temp (24hrs), Avg:101.7 F (38.7 C), Min:99.4 F (37.4 C), Max:103 F (39.4 C)  Recent Labs  Lab 03/06/24 0157  WBC 15.8*  CREATININE 1.47*  LATICACIDVEN 1.4    Estimated Creatinine Clearance: 76 mL/min (A) (by C-G formula based on SCr of 1.47 mg/dL (H)).    No Known Allergies  Antimicrobials this admission: Vancomycin 4/8>> Cefepime 4/8 >> Metronidazole 4/8 x 1   Dose adjustments this admission:    Microbiology results: 4/8 BCx: pending   UCx:      Sputum:      MRSA PCR:    Thank you for allowing pharmacy to be a part of this patient's care.  Bari Mantis PharmD Clinical Pharmacist 03/06/2024

## 2024-03-06 NOTE — ED Triage Notes (Signed)
 Pt to ED via [facility] from home c/o [facility] and cellulitis in lower abdominal area. Per EMS pt started complaining of shortness of breath, O2 sats 95% on baseline 6L.

## 2024-03-06 NOTE — Progress Notes (Signed)
 CODE SEPSIS - PHARMACY COMMUNICATION  **Broad Spectrum Antibiotics should be administered within 1 hour of Sepsis diagnosis**  Time Code Sepsis Called/Page Received: 4/8 @ 0157   Antibiotics Ordered: Cefepime, Vancomycin  Time of 1st antibiotic administration: Cefepime 2 gm IV X 1 on 4/8 @ 0206  Additional action taken by pharmacy:   If necessary, Name of Provider/Nurse Contacted:     Adrina Armijo D ,PharmD Clinical Pharmacist  03/06/2024  2:12 AM

## 2024-03-06 NOTE — ED Notes (Signed)
Lab called to collect second set of blood cultures.

## 2024-03-06 NOTE — H&P (Addendum)
 History and Physical    Sandra Holloway QMV:[id number] DOB: 12-07-1975 DOA: 03/06/2024  PCP: Dairl Ponder, MD (Confirm with patient/family/NH records and if not entered, this has to be entered at [facility] point of entry) Patient coming from: Home  I have personally briefly reviewed patient's old medical records in [facility]  Chief Complaint: SOB, worsening of lower abdominal  wall rash and pain  HPI: Sandra Holloway is a 48 y.o. female with medical history significant of HTN/chronic HFpEF, subglottic stenosis and chronic hypoxic respiratory failure status post tracheostomy on 6 L via trach collar and ventilator support HS, morbid obesity, presented with multiple complaints including worsening of rash with pain on the lower abdominal wall and increasing cough and shortness of breath.  Patient started to have increasing productive cough and shortness of breath for past week, with clear phlegm production, and orthopnea.  Some increasing peripheral edema.  Several days ago she started to have pain and rash of lower abdominal wall gradually getting worse and started to have fever chills last night and came to ED.  ED Course: Fever 103, tachypneic tachycardia 108 blood pressure 150/50 with hypoxia O2 saturation 98% on 8 L / 40%.  Chest x-ray showed signs of pulmonary edema and cardiomegaly.  Blood work showed WBC 15.8 hemoglobin 10.3 creatinine 1.14 her baseline 1.2-1.5 K3.6 bicarb 27.  VBG 7.3 1/58/3.  Patient was given vancomycin and cefepime, IV Solu-Medrol and DuoNeb treatment.  Review of Systems: As per HPI otherwise 14 point review of systems negative.    Past Medical History:  Diagnosis Date   Dyspnea    Hypertension     Past Surgical History:  Procedure Laterality Date   TRACHEOSTOMY       reports that she has quit smoking. Her smoking use included cigarettes. She has never used smokeless tobacco. She reports that she does not currently use alcohol. She reports that she does not  currently use drugs.  No Known Allergies  History reviewed. No pertinent family history.   Prior to Admission medications   Medication Sig Start Date End Date Taking? Authorizing Provider  acetaminophen (TYLENOL) 325 MG tablet Take 2 tablets (650 mg total) by mouth every 6 (six) hours as needed for mild pain (or Fever >/= 101). Patient not taking: Reported on 07/05/2023 03/30/22   Lurene Shadow, MD  albuterol (VENTOLIN HFA) 108 (90 Base) MCG/ACT inhaler Inhale 2 puffs into the lungs every 6 (six) hours as needed for wheezing or shortness of breath. Patient not taking: Reported on 11/03/2022 10/21/22   Tresa Moore, MD  cyclobenzaprine (FLEXERIL) 5 MG tablet Take 5 mg by mouth at bedtime.    [provider]  DULoxetine (CYMBALTA) 30 MG capsule Take 30 mg by mouth at bedtime. 06/02/23   [provider]  furosemide (LASIX) 40 MG tablet Take 40 mg by mouth daily.    [provider]  ipratropium-albuterol (DUONEB) 0.5-2.5 (3) MG/3ML SOLN Take 3 mLs by nebulization every 6 (six) hours as needed. 10/21/22   Tresa Moore, MD  loratadine (CLARITIN) 10 MG tablet Take 10 mg by mouth daily.    [provider]  omeprazole (PRILOSEC) 20 MG capsule Take 20 mg by mouth daily.    [provider]  oxyCODONE-acetaminophen (PERCOCET) 10-325 MG tablet Take 1 tablet by mouth every 4 (four) hours as needed. 06/15/23   [provider]  pregabalin (LYRICA) 25 MG capsule Take 25 mg by mouth 2 (two) times daily. 06/15/23   [provider]  triamcinolone cream (KENALOG) 0.1 % Apply 1 Application topically 2 (two) times daily. 11/09/22   Wouk, Wilfred Curtis, MD  XTAMPZA ER 9 MG C12A Take 1 capsule by mouth at bedtime. 06/16/23   [provider]    Physical Exam: Vitals:   03/06/24 0430 03/06/24 0600 03/06/24 0730 03/06/24 0801  BP:  136/75 123/67   Pulse:  80 79   Resp:  20 19   Temp: (!) 102.8 F (39.3 C)   99.4 F (37.4 C)  TempSrc:  Oral   Oral  SpO2:  99% 100%   Weight:      Height:        Constitutional: NAD, calm, comfortable Vitals:   03/06/24 0430 03/06/24 0600 03/06/24 0730 03/06/24 0801  BP:  136/75 123/67   Pulse:  80 79   Resp:  20 19   Temp: (!) 102.8 F (39.3 C)   99.4 F (37.4 C)  TempSrc: Oral   Oral  SpO2:  99% 100%   Weight:      Height:       Eyes: PERRL, lids and conjunctivae normal ENMT: Mucous membranes are moist. Posterior pharynx clear of any exudate or lesions.Normal dentition.  Neck: normal, supple, no masses, no thyromegaly Respiratory: clear to auscultation bilaterally, no wheezing, fine crackles on bilateral lower fields increasing respiratory effort. No accessory muscle use.  Cardiovascular: Regular rate and rhythm, no murmurs / rubs / gallops.  1+ extremity edema. 2+ pedal pulses. No carotid bruits.  Abdomen: no tenderness, no masses palpated. No hepatosplenomegaly. Bowel sounds positive.  Musculoskeletal: no clubbing / cyanosis. No joint deformity upper and lower extremities. Good ROM, no contractures. Normal muscle tone.  Skin: Rash and tenderness on lower abdominal wall Neurologic: CN 2-12 grossly intact. Sensation intact, DTR normal. Strength 5/5 in all 4.  Psychiatric: Normal judgment and insight. Alert and oriented x 3. Normal mood.     Labs on Admission: I have personally reviewed following labs and imaging studies  CBC: Recent Labs  Lab 03/06/24 0157  WBC 15.8*  NEUTROABS 13.9*  HGB 10.3*  HCT 32.6*  MCV 83.4  PLT 222   Basic Metabolic Panel: Recent Labs  Lab 03/06/24 0157  NA 138  K 3.6  CL 103  CO2 27  GLUCOSE 159*  BUN 22*  CREATININE 1.47*  CALCIUM 8.9   GFR: Estimated Creatinine Clearance: 76 mL/min (A) (by C-G formula based on SCr of 1.47 mg/dL (H)). Liver Function Tests: Recent Labs  Lab 03/06/24 0157  AST 17  ALT 13  ALKPHOS 69  BILITOT 0.6  PROT 7.9  ALBUMIN 4.0   No results for input(s): "LIPASE", "AMYLASE" in the last 168  hours. No results for input(s): "AMMONIA" in the last 168 hours. Coagulation Profile: Recent Labs  Lab 03/06/24 0157  INR 1.1   Cardiac Enzymes: No results for input(s): "CKTOTAL", "CKMB", "CKMBINDEX", "TROPONINI" in the last 168 hours. BNP (last 3 results) No results for input(s): "PROBNP" in the last 8760 hours. HbA1C: No results for input(s): "HGBA1C" in the last 72 hours. CBG: No results for input(s): "GLUCAP" in the last 168 hours. Lipid Profile: No results for input(s): "CHOL", "HDL", "LDLCALC", "TRIG", "CHOLHDL", "LDLDIRECT" in the last 72 hours. Thyroid Function Tests: No results for input(s): "TSH", "T4TOTAL", "FREET4", "T3FREE", "THYROIDAB" in the last 72 hours. Anemia Panel: No results for input(s): "VITAMINB12", "FOLATE", "FERRITIN", "TIBC", "IRON", "RETICCTPCT" in the last 72 hours. Urine analysis:    Component Value Date/Time   COLORURINE YELLOW (  A) 03/06/2024 0429   APPEARANCEUR HAZY (A) 03/06/2024 0429   LABSPEC 1.024 03/06/2024 0429   PHURINE 5.0 03/06/2024 0429   GLUCOSEU NEGATIVE 03/06/2024 0429   HGBUR SMALL (A) 03/06/2024 0429   BILIRUBINUR NEGATIVE 03/06/2024 0429   KETONESUR NEGATIVE 03/06/2024 0429   PROTEINUR 30 (A) 03/06/2024 0429   NITRITE NEGATIVE 03/06/2024 0429   LEUKOCYTESUR NEGATIVE 03/06/2024 0429    Radiological Exams on Admission: CT ABDOMEN PELVIS W CONTRAST Result Date: 03/06/2024 CLINICAL DATA:  Sepsis and abdominal wall cellulitis, evaluate for abscess. EXAM: CT ABDOMEN AND PELVIS WITH CONTRAST TECHNIQUE: Multidetector CT imaging of the abdomen and pelvis was performed using the standard protocol following bolus administration of intravenous contrast. RADIATION DOSE REDUCTION: This exam was performed according to the departmental dose-optimization program which includes automated exposure control, adjustment of the mA and/or kV according to patient size and/or use of iterative reconstruction technique. CONTRAST:  80mL OMNIPAQUE IOHEXOL 350  MG/ML SOLN COMPARISON:  09/29/2022 FINDINGS: Lower chest: Hazy and streaky density at the lung bases attributed atelectasis. Hepatobiliary: No focal liver abnormality.No evidence of biliary obstruction or stone. Pancreas: Unremarkable. Spleen: Unremarkable. Adrenals/Urinary Tract: Negative adrenals. Exophytic right renal lesion with ~ 40 Hounsfield unit densitometry, 2 cm in size which is stable. No hydronephrosis. Unremarkable bladder. Stomach/Bowel:  No obstruction. No visible bowel inflammation Vascular/Lymphatic: No acute vascular abnormality. No mass or adenopathy. Reproductive:No pathologic findings. Other: Subcutaneous reticulation with skin thickening at the lower abdominal wall, similar appearance was seen where partially covered previously. Small fatty umbilical hernia. Musculoskeletal: No acute abnormalities. IMPRESSION: 1. Panniculitis, chronic or recurrent when compared to 2023 CT, without abscess or soft tissue gas. No evidence of intra-abdominal inflammation. 2. 2 cm right renal lesion with densitometry indeterminate between solid and cystic nature, but reassuring size stability since at least 2023. palpation ultrasound could be attempted to further evaluate. 3. Small fatty umbilical hernia. Electronically Signed   By: Tiburcio Pea M.D.   On: 03/06/2024 06:45   DG Chest Port 1 View Result Date: 03/06/2024 CLINICAL DATA:  Shortness of breath and lower abdominal cellulitis. EXAM: PORTABLE CHEST 1 VIEW COMPARISON:  September 09, 2023 FINDINGS: There is stable tracheostomy tube positioning. The cardiac silhouette is mildly enlarged and unchanged in size. There is mild prominence of the central pulmonary vasculature. Mild atelectasis and/or infiltrate is seen within the right lung base. No pleural effusion or pneumothorax is identified. The visualized skeletal structures are unremarkable. IMPRESSION: 1. Stable cardiomegaly with mild central pulmonary vascular congestion. 2. Mild right basilar  atelectasis and/or infiltrate. Electronically Signed   By: Aram Candela M.D.   On: 03/06/2024 03:36    EKG: Independently reviewed.  Sinus tachycardia, no acute tubular injuries.  Assessment/Plan Principal Problem:   Sepsis due to undetermined organism [facility]) Active Problems:   Sepsis (HCC)   Acute on chronic diastolic CHF (congestive heart failure) (HCC)  (please populate well all problems here in Problem List. (For example, if patient is on BP meds at home and you resume or decide to hold them, it is a problem that needs to be her. Same for CAD, COPD, HLD and so on)  Acute on chronic HFpEF decompensation Acute on chronic hypoxic respiratory failure - Started with symptoms and signs of fluid overload, continue IV Lasix 60 mg twice daily, repeat chest x-ray tomorrow.  Adjust Lasix dosage according to response. -Echo   Sepsis -Without acute endorgan damage - Sepsis evidenced by new onset fever tachycardia leukocytosis, source of infection ice lower abdominal wall cellulitis -  Severe, continue cefepime and vancomycin as per cellulitis protocol - As patient also has concurrent CHF decompensation with increasing hypoxia, no additional bolus given.  Blood pressure stable and lactic acid level within normal limits, Lasix will be started as mentioned above.  Asthma - Patient was given Solu-Medrol and DuoNebs in the ED for wheezing.  However her x-ray similarly has increasing fluid overload, wonder patient has cardiac asthma at this point.  Diuresis as above then reevaluate.  Follow-up chest x-ray in the morning - DuoNebs as needed  Morbid obesity - BMI 72, calorie control recommended    DVT prophylaxis: Lovenox Code Status: Full code Family Communication: None at present Disposition Plan: Patient sick with sepsis as well as CHF decompensation requiring IV antibiotics and IV Lasix, expected was abdominal hospital stay Consults called: None Admission status: PCU admission   Emeline General MD [facility] Pager [phone]  03/06/2024, 8:24 AM

## 2024-03-06 NOTE — Progress Notes (Signed)
 Pt stable with no acute s/s of distress. Pt transferred to [address] a long with personal belongings. Report given to pt night nurse.

## 2024-03-06 NOTE — ED Notes (Signed)
 Patient requesting pain medications and a meal tray, Dr. Chipper Herb made aware of patient requests.

## 2024-03-06 NOTE — ED Notes (Signed)
 Assisted patient onto bedside commode, provided patient with remote and drink. Patient requesting to stay seated on commode for a little while, patient provided call bell.

## 2024-03-06 NOTE — Progress Notes (Signed)
 Patient stated that the humidity from the tracheostomy collar was chocking her. So I changed her to her home venturi mask at 6L. Patient oxygen saturation is 98%.

## 2024-03-07 DIAGNOSIS — A419 Sepsis, unspecified organism: Secondary | ICD-10-CM | POA: Diagnosis not present

## 2024-03-07 LAB — HIV ANTIBODY (ROUTINE TESTING W REFLEX): HIV Screen 4th Generation wRfx: NONREACTIVE

## 2024-03-07 LAB — BASIC METABOLIC PANEL WITH GFR
Anion gap: 9 (ref 5–15)
BUN: 26 mg/dL — ABNORMAL HIGH (ref 6–20)
CO2: 26 mmol/L (ref 22–32)
Calcium: 8.8 mg/dL — ABNORMAL LOW (ref 8.9–10.3)
Chloride: 101 mmol/L (ref 98–111)
Creatinine, Ser: 1.57 mg/dL — ABNORMAL HIGH (ref 0.44–1.00)
GFR, Estimated: 41 mL/min — ABNORMAL LOW (ref >60)
Glucose, Bld: 125 mg/dL — ABNORMAL HIGH (ref 70–99)
Potassium: 3.8 mmol/L (ref 3.5–5.1)
Sodium: 136 mmol/L (ref 135–145)

## 2024-03-07 NOTE — Progress Notes (Signed)
 Communicate with patient to call when she was ready to go on ventilator.  Went back to check on patient and she states she does not want to wear machine tonight. She wanted me to place her on aerosol therapy, no further complaints or intervention at this time. Ventilator remains at bedside.

## 2024-03-07 NOTE — Plan of Care (Signed)

## 2024-03-07 NOTE — Progress Notes (Signed)
 Progress Note    Sandra Holloway  ZOX:[id number] DOB: February 12, 1976  DOA: 03/06/2024 PCP: Dairl Ponder, MD      Brief Narrative:    Medical records reviewed and are as summarized below:  Sandra Holloway is a 48 y.o. female  with medical history significant of HTN/chronic HFpEF, subglottic stenosis and chronic hypoxic respiratory failure status post tracheostomy on 6 L via trach collar and ventilator support HS, morbid obesity, presented with multiple complaints including worsening of rash with pain on the lower abdominal wall and increasing cough and shortness of breath.   Patient started to have increasing productive cough and shortness of breath for past week, with clear phlegm production, and orthopnea.  She also had peripheral edema.  She said she had not been taking her Lasix regularly and sometimes does not take Lasix when she goes out.  She also reported rash on lower abdominal wall that started several days prior to admission.   ED Course: Fever 103, tachypneic tachycardia 108 blood pressure 150/50 with hypoxia O2 saturation 98% on 8 L / 40%.  Chest x-ray showed signs of pulmonary edema and cardiomegaly.  Blood work showed WBC 15.8 hemoglobin 10.3 creatinine 1.14 her baseline 1.2-1.5 K3.6 bicarb 27.  VBG 7.3 1/58/3.   Patient was given vancomycin and cefepime, IV Solu-Medrol and DuoNeb treatment.     Assessment/Plan:   Principal Problem:   Sepsis due to undetermined organism [facility]) Active Problems:   Sepsis (HCC)   Acute on chronic diastolic CHF (congestive heart failure) (HCC)    Body mass index is 67.98 kg/m.  (morbid obesity)   Sepsis secondary to lower abdominal wall cellulitis: Continue IV cefepime and vancomycin for now.  Analgesics as needed for pain.  Follow-up blood cultures.   Acute on chronic HFpEF: Continue IV Lasix.  Monitor BMP, daily weight and urine output.   Acute on chronic hypoxic respiratory failure: Oxygen therapy has been tapered down from 8  L to 6 L.  She uses 6 L via trach collar at baseline.   Moderate intermittent asthma: Doubt asthma exacerbation at this time.  Suspect wheezing was likely from CHF exacerbation. S/p treatment with IV Solu-Medrol.      Diet Order             Diet regular Room service appropriate? Yes; Fluid consistency: Thin  Diet effective now                            Consultants: None  Procedures: None    Medications:    cyclobenzaprine  5 mg Oral QHS   enoxaparin (LOVENOX) injection  90 mg Subcutaneous Q24H   furosemide  60 mg Intravenous BID   guaiFENesin  1,200 mg Oral BID   loratadine  10 mg Oral Daily   oxyCODONE  15 mg Oral Q12H   pantoprazole  40 mg Oral Daily   pregabalin  25 mg Oral BID   sertraline  100 mg Oral Daily   sodium chloride flush  3 mL Intravenous Q12H   Continuous Infusions:  ceFEPime (MAXIPIME) IV 2 g (03/07/24 0208)   vancomycin 1,500 mg (03/07/24 0003)     Anti-infectives (From admission, onward)    Start     Dose/Rate Route Frequency Ordered Stop   03/07/24 0100  vancomycin (VANCOREADY) IVPB 1500 mg/300 mL        1,500 mg 150 mL/hr over 120 Minutes Intravenous Every 24 hours 03/06/24 0833  03/06/24 1000  ceFEPIme (MAXIPIME) 2 g in sodium chloride 0.9 % 100 mL IVPB        2 g 200 mL/hr over 30 Minutes Intravenous Every 8 hours 03/06/24 0823     03/06/24 0900  vancomycin (VANCOREADY) IVPB 1500 mg/300 mL        1,500 mg 150 mL/hr over 120 Minutes Intravenous  Once 03/06/24 0823 03/06/24 1138   03/06/24 0200  ceFEPIme (MAXIPIME) 2 g in sodium chloride 0.9 % 100 mL IVPB        2 g 200 mL/hr over 30 Minutes Intravenous  Once 03/06/24 0157 03/06/24 0242   03/06/24 0200  metroNIDAZOLE (FLAGYL) IVPB 500 mg        500 mg 100 mL/hr over 60 Minutes Intravenous  Once 03/06/24 0157 03/06/24 0331   03/06/24 0200  vancomycin (VANCOCIN) IVPB 1000 mg/200 mL premix        1,000 mg 200 mL/hr over 60 Minutes Intravenous  Once 03/06/24 0157  03/06/24 9147              Family Communication/Anticipated D/C date and plan/Code Status   DVT prophylaxis:      Code Status: Full Code  Family Communication: None Disposition Plan: Plan to discharge home   Status is: Inpatient Remains inpatient appropriate because: CHF exacerbation, sepsis       Subjective:   Events noted.  She complains of lower abdominal pain.  No shortness of breath or chest pain.  She feels no better today.  She states she had not been using the Lasix as prescribed.  She normally does not take Lasix when she leaves the house because she carries an oxygen tank around and does not want to be going to the bathroom frequently.    Objective:    Vitals:   03/07/24 0317 03/07/24 0341 03/07/24 0500 03/07/24 0831  BP: (!) 99/55   116/61  Pulse: 63 68  64  Resp: 18     Temp: 97.8 F (36.6 C)   97.9 F (36.6 C)  TempSrc:      SpO2: 98% 98%  99%  Weight:   (!) 168.6 kg   Height:       No data found.   Intake/Output Summary (Last 24 hours) at 03/07/2024 0936 Last data filed at 03/06/2024 2045 Gross per 24 hour  Intake 403.56 ml  Output --  Net 403.56 ml   Filed Weights   03/06/24 0151 03/07/24 0500  Weight: (!) 179.3 kg (!) 168.6 kg    Exam:  GEN: NAD SKIN: Warm and dry EYES: No pallor or icterus ENT: MMM, + trach CV: RRR PULM: CTA B ABD: soft, obese. Swelling, induartion and tenderness lower abdomen, +BS CNS: AAO x 3, non focal EXT: B/l leg edema (L>R), no erythema or  or tenderness        Data Reviewed:   I have personally reviewed following labs and imaging studies:  Labs: Labs show the following:   Basic Metabolic Panel: Recent Labs  Lab 03/06/24 0157 03/07/24 0503  NA 138 136  K 3.6 3.8  CL 103 101  CO2 27 26  GLUCOSE 159* 125*  BUN 22* 26*  CREATININE 1.47* 1.57*  CALCIUM 8.9 8.8*   GFR Estimated Creatinine Clearance: 68.2 mL/min (A) (by C-G formula based on SCr of 1.57 mg/dL (H)). Liver Function  Tests: Recent Labs  Lab 03/06/24 0157  AST 17  ALT 13  ALKPHOS 69  BILITOT 0.6  PROT 7.9  ALBUMIN 4.0  No results for input(s): "LIPASE", "AMYLASE" in the last 168 hours. No results for input(s): "AMMONIA" in the last 168 hours. Coagulation profile Recent Labs  Lab 03/06/24 0157  INR 1.1    CBC: Recent Labs  Lab 03/06/24 0157  WBC 15.8*  NEUTROABS 13.9*  HGB 10.3*  HCT 32.6*  MCV 83.4  PLT 222   Cardiac Enzymes: No results for input(s): "CKTOTAL", "CKMB", "CKMBINDEX", "TROPONINI" in the last 168 hours. BNP (last 3 results) No results for input(s): "PROBNP" in the last 8760 hours. CBG: Recent Labs  Lab 03/06/24 2033  GLUCAP 167*   D-Dimer: No results for input(s): "DDIMER" in the last 72 hours. Hgb A1c: No results for input(s): "HGBA1C" in the last 72 hours. Lipid Profile: No results for input(s): "CHOL", "HDL", "LDLCALC", "TRIG", "CHOLHDL", "LDLDIRECT" in the last 72 hours. Thyroid function studies: No results for input(s): "TSH", "T4TOTAL", "T3FREE", "THYROIDAB" in the last 72 hours.  Invalid input(s): "FREET3" Anemia work up: No results for input(s): "VITAMINB12", "FOLATE", "FERRITIN", "TIBC", "IRON", "RETICCTPCT" in the last 72 hours. Sepsis Labs: Recent Labs  Lab 03/06/24 0157  PROCALCITON <0.10  WBC 15.8*  LATICACIDVEN 1.4    Microbiology Recent Results (from the past 240 hours)  Blood Culture (routine x 2)     Status: None (Preliminary result)   Collection Time: 03/06/24  1:56 AM   Specimen: BLOOD  Result Value Ref Range Status   Specimen Description BLOOD RH  Final   Special Requests   Final    BOTTLES DRAWN AEROBIC AND ANAEROBIC Blood Culture results may not be optimal due to an inadequate volume of blood received in culture bottles   Culture   Final    NO GROWTH 1 DAY Performed at [facility], [address] [place], [place] [address]    Report Status PENDING  Incomplete  Resp panel by RT-PCR (RSV, Flu A&B, Covid)  Anterior Nasal Swab     Status: None   Collection Time: 03/06/24  2:37 AM   Specimen: Anterior Nasal Swab  Result Value Ref Range Status   SARS Coronavirus 2 by RT PCR NEGATIVE NEGATIVE Final    Comment: (NOTE) SARS-CoV-2 target nucleic acids are NOT DETECTED.  The SARS-CoV-2 RNA is generally detectable in upper respiratory specimens during the acute phase of infection. The lowest concentration of SARS-CoV-2 viral copies this assay can detect is 138 copies/mL. A negative result does not preclude SARS-Cov-2 infection and should not be used as the sole basis for treatment or other patient management decisions. A negative result may occur with  improper specimen collection/handling, submission of specimen other than nasopharyngeal swab, presence of viral mutation(s) within the areas targeted by this assay, and inadequate number of viral copies(<138 copies/mL). A negative result must be combined with clinical observations, patient history, and epidemiological information. The expected result is Negative.  Fact Sheet for Patients:  BloggerCourse.com  Fact Sheet for Healthcare Providers:  SeriousBroker.it  This test is no t yet approved or cleared by the [place] FDA and  has been authorized for detection and/or diagnosis of SARS-CoV-2 by FDA under an Emergency Use Authorization (EUA). This EUA will remain  in effect (meaning this test can be used) for the duration of the COVID-19 declaration under Section 564(b)(1) of the Act, 21 U.S.C.section 360bbb-3(b)(1), unless the authorization is terminated  or revoked sooner.       Influenza A by PCR NEGATIVE NEGATIVE Final   Influenza B by PCR NEGATIVE NEGATIVE Final    Comment: (NOTE)  The Xpert Xpress SARS-CoV-2/FLU/RSV plus assay is intended as an aid in the diagnosis of influenza from Nasopharyngeal swab specimens and should not be used as a sole basis for treatment. Nasal washings  and aspirates are unacceptable for Xpert Xpress SARS-CoV-2/FLU/RSV testing.  Fact Sheet for Patients: BloggerCourse.com  Fact Sheet for Healthcare Providers: SeriousBroker.it  This test is not yet approved or cleared by the [place] FDA and has been authorized for detection and/or diagnosis of SARS-CoV-2 by FDA under an Emergency Use Authorization (EUA). This EUA will remain in effect (meaning this test can be used) for the duration of the COVID-19 declaration under Section 564(b)(1) of the Act, 21 U.S.C. section 360bbb-3(b)(1), unless the authorization is terminated or revoked.     Resp Syncytial Virus by PCR NEGATIVE NEGATIVE Final    Comment: (NOTE) Fact Sheet for Patients: BloggerCourse.com  Fact Sheet for Healthcare Providers: SeriousBroker.it  This test is not yet approved or cleared by the [place] FDA and has been authorized for detection and/or diagnosis of SARS-CoV-2 by FDA under an Emergency Use Authorization (EUA). This EUA will remain in effect (meaning this test can be used) for the duration of the COVID-19 declaration under Section 564(b)(1) of the Act, 21 U.S.C. section 360bbb-3(b)(1), unless the authorization is terminated or revoked.  Performed at [facility], [address] [place], [place] [address]   Blood Culture (routine x 2)     Status: None (Preliminary result)   Collection Time: 03/06/24  3:05 AM   Specimen: BLOOD  Result Value Ref Range Status   Specimen Description BLOOD BLOOD RIGHT ARM  Final   Special Requests   Final    BOTTLES DRAWN AEROBIC AND ANAEROBIC Blood Culture adequate volume   Culture   Final    NO GROWTH 1 DAY Performed at [facility], [address] [place], [place] [address]    Report Status PENDING  Incomplete    Procedures and diagnostic studies:  DG Chest 1 View Result Date:  03/06/2024 CLINICAL DATA:  [address] CHF (congestive heart failure) (HCC) [address] EXAM: CHEST  1 VIEW COMPARISON:  Earlier film of the same day FINDINGS: Stable tracheostomy device. Marginal increase in pulmonary vascular congestion. Heart size upper limits normal for technique. No pneumothorax. No evident pneumomediastinum. No effusion. Visualized bones unremarkable. IMPRESSION: Marginal increase in pulmonary vascular congestion. Electronically Signed   By: Corlis Leak M.D.   On: 03/06/2024 10:49   CT ABDOMEN PELVIS W CONTRAST Result Date: 03/06/2024 CLINICAL DATA:  Sepsis and abdominal wall cellulitis, evaluate for abscess. EXAM: CT ABDOMEN AND PELVIS WITH CONTRAST TECHNIQUE: Multidetector CT imaging of the abdomen and pelvis was performed using the standard protocol following bolus administration of intravenous contrast. RADIATION DOSE REDUCTION: This exam was performed according to the departmental dose-optimization program which includes automated exposure control, adjustment of the mA and/or kV according to patient size and/or use of iterative reconstruction technique. CONTRAST:  80mL OMNIPAQUE IOHEXOL 350 MG/ML SOLN COMPARISON:  09/29/2022 FINDINGS: Lower chest: Hazy and streaky density at the lung bases attributed atelectasis. Hepatobiliary: No focal liver abnormality.No evidence of biliary obstruction or stone. Pancreas: Unremarkable. Spleen: Unremarkable. Adrenals/Urinary Tract: Negative adrenals. Exophytic right renal lesion with ~ 40 Hounsfield unit densitometry, 2 cm in size which is stable. No hydronephrosis. Unremarkable bladder. Stomach/Bowel:  No obstruction. No visible bowel inflammation Vascular/Lymphatic: No acute vascular abnormality. No mass or adenopathy. Reproductive:No pathologic findings. Other: Subcutaneous reticulation with skin thickening at the lower abdominal wall, similar appearance was seen where partially  covered previously. Small fatty umbilical hernia. Musculoskeletal: No acute  abnormalities. IMPRESSION: 1. Panniculitis, chronic or recurrent when compared to 2023 CT, without abscess or soft tissue gas. No evidence of intra-abdominal inflammation. 2. 2 cm right renal lesion with densitometry indeterminate between solid and cystic nature, but reassuring size stability since at least 2023. palpation ultrasound could be attempted to further evaluate. 3. Small fatty umbilical hernia. Electronically Signed   By: Tiburcio Pea M.D.   On: 03/06/2024 06:45   DG Chest Port 1 View Result Date: 03/06/2024 CLINICAL DATA:  Shortness of breath and lower abdominal cellulitis. EXAM: PORTABLE CHEST 1 VIEW COMPARISON:  September 09, 2023 FINDINGS: There is stable tracheostomy tube positioning. The cardiac silhouette is mildly enlarged and unchanged in size. There is mild prominence of the central pulmonary vasculature. Mild atelectasis and/or infiltrate is seen within the right lung base. No pleural effusion or pneumothorax is identified. The visualized skeletal structures are unremarkable. IMPRESSION: 1. Stable cardiomegaly with mild central pulmonary vascular congestion. 2. Mild right basilar atelectasis and/or infiltrate. Electronically Signed   By: Aram Candela M.D.   On: 03/06/2024 03:36               LOS: 1 day   Nehemyah Foushee  [facility]   Pager on [email or web address]. If 7PM-7AM, please contact night-coverage at [email or web address]     03/07/2024, 9:36 AM

## 2024-03-08 ENCOUNTER — Inpatient Hospital Stay (HOSPITAL_COMMUNITY)
Admit: 2024-03-08 | Discharge: 2024-03-08 | Disposition: A | Discharge status: Home / Self Care | Attending: Internal Medicine | Admitting: Internal Medicine

## 2024-03-08 DIAGNOSIS — I5031 Acute diastolic (congestive) heart failure: Secondary | ICD-10-CM

## 2024-03-08 DIAGNOSIS — A419 Sepsis, unspecified organism: Secondary | ICD-10-CM | POA: Diagnosis not present

## 2024-03-08 LAB — CBC WITH DIFFERENTIAL/PLATELET
Abs Immature Granulocytes: 0.04 10*3/uL (ref 0.00–0.07)
Basophils Absolute: 0 10*3/uL (ref 0.0–0.1)
Basophils Relative: 0 %
Eosinophils Absolute: 0.1 10*3/uL (ref 0.0–0.5)
Eosinophils Relative: 1 %
HCT: 30.6 % — ABNORMAL LOW (ref 36.0–46.0)
Hemoglobin: 9.7 g/dL — ABNORMAL LOW (ref 12.0–15.0)
Immature Granulocytes: 0 %
Lymphocytes Relative: 27 %
Lymphs Abs: 3.1 10*3/uL (ref 0.7–4.0)
MCH: 26.1 pg (ref 26.0–34.0)
MCHC: 31.7 g/dL (ref 30.0–36.0)
MCV: 82.5 fL (ref 80.0–100.0)
Monocytes Absolute: 1 10*3/uL (ref 0.1–1.0)
Monocytes Relative: 8 %
Neutro Abs: 7.4 10*3/uL (ref 1.7–7.7)
Neutrophils Relative %: 64 %
Platelets: 238 10*3/uL (ref 150–400)
RBC: 3.71 MIL/uL — ABNORMAL LOW (ref 3.87–5.11)
RDW: 15.5 % (ref 11.5–15.5)
WBC: 11.7 10*3/uL — ABNORMAL HIGH (ref 4.0–10.5)
nRBC: 0 % (ref 0.0–0.2)

## 2024-03-08 LAB — BASIC METABOLIC PANEL WITH GFR
Anion gap: 12 (ref 5–15)
BUN: 30 mg/dL — ABNORMAL HIGH (ref 6–20)
CO2: 28 mmol/L (ref 22–32)
Calcium: 9 mg/dL (ref 8.9–10.3)
Chloride: 99 mmol/L (ref 98–111)
Creatinine, Ser: 1.69 mg/dL — ABNORMAL HIGH (ref 0.44–1.00)
GFR, Estimated: 37 mL/min — ABNORMAL LOW (ref >60)
Glucose, Bld: 93 mg/dL (ref 70–99)
Potassium: 4.1 mmol/L (ref 3.5–5.1)
Sodium: 139 mmol/L (ref 135–145)

## 2024-03-08 LAB — ECHOCARDIOGRAM COMPLETE
Height: 62 in
S' Lateral: 4 cm
Weight: 6112 [oz_av]

## 2024-03-08 LAB — MAGNESIUM: Magnesium: 2.2 mg/dL (ref 1.7–2.4)

## 2024-03-08 MED ORDER — CEFAZOLIN SODIUM-DEXTROSE 1-4 GM/50ML-% IV SOLN
1.0000 g | Freq: Three times a day (TID) | INTRAVENOUS | Status: DC
  Administered 2024-03-08 – 2024-03-09 (×4): 1 g via INTRAVENOUS
  Filled 2024-03-08 (×5): qty 50

## 2024-03-08 NOTE — Progress Notes (Signed)
*  PRELIMINARY RESULTS* Echocardiogram 2D Echocardiogram has been performed.  Cristela Blue 03/08/2024, 7:56 AM

## 2024-03-08 NOTE — Progress Notes (Signed)
*  PRELIMINARY RESULTS* Echocardiogram 2D Echocardiogram has been performed.  Sandra Holloway 03/08/2024, 7:57 AM

## 2024-03-08 NOTE — Plan of Care (Signed)

## 2024-03-08 NOTE — Progress Notes (Addendum)
 Progress Note    Sandra Holloway  ZOX:[id number] DOB: 1976-04-09  DOA: 03/06/2024 PCP: Dairl Ponder, MD      Brief Narrative:    Medical records reviewed and are as summarized below:  Sandra Holloway is a 48 y.o. female  with medical history significant of HTN/chronic HFpEF, subglottic stenosis and chronic hypoxic respiratory failure status post tracheostomy on 6 L via trach collar and ventilator support HS, morbid obesity, presented with multiple complaints including worsening of rash with pain on the lower abdominal wall and increasing cough and shortness of breath.   Patient started to have increasing productive cough and shortness of breath for past week, with clear phlegm production, and orthopnea.  She also had peripheral edema.  She said she had not been taking her Lasix regularly and sometimes does not take Lasix when she goes out.  She also reported rash on lower abdominal wall that started several days prior to admission.   ED Course: Fever 103, tachypneic tachycardia 108 blood pressure 150/50 with hypoxia O2 saturation 98% on 8 L / 40%.  Chest x-ray showed signs of pulmonary edema and cardiomegaly.  Blood work showed WBC 15.8 hemoglobin 10.3 creatinine 1.14 her baseline 1.2-1.5 K3.6 bicarb 27.  VBG 7.3 1/58/3.   Patient was given vancomycin and cefepime, IV Solu-Medrol and DuoNeb treatment.     Assessment/Plan:   Principal Problem:   Sepsis due to undetermined organism [facility]) Active Problems:   Sepsis (HCC)   Acute on chronic diastolic CHF (congestive heart failure) (HCC)    Body mass index is 69.87 kg/m.  (morbid obesity)   Sepsis secondary to lower abdominal wall cellulitis: Improving.  De-escalate IV antibiotics from cefepime and vancomycin to IV Ancef.  Continue analgesics as needed for pain.  No growth on blood cultures thus far.     Acute on chronic HFpEF: Improved.  Discontinue IV Lasix.  Monitor BMP, daily weight and urine output. 2D echo is  pending   Acute on chronic hypoxic respiratory failure: She is tolerating 6 L/min oxygen via trach collar.  She uses 6 L via trach collar at baseline.   Moderate intermittent asthma: Doubt asthma exacerbation at this time.  Suspect wheezing was likely from CHF exacerbation. S/p treatment with IV Solu-Medrol.   CKD stage 3B: Creatinine trending upward but still within stage IIIb category.  Of note, patient said she is not aware of CKD diagnosis.  However, chart review showed that she saw her PCP, Dr. Dalbert Mayotte, on 09/23/2023, and CKD stage IIIb was listed as one of the diagnoses.  Patient has been explained in detail the diagnosis and management of CKD.   All diagnoses and management plan discussed with patient and she verbalized understanding.   Transfer from progressive care unit to MedSurg unit.  Diet Order             Diet regular Room service appropriate? Yes; Fluid consistency: Thin  Diet effective now                            Consultants: None  Procedures: None    Medications:    cyclobenzaprine  5 mg Oral QHS   enoxaparin (LOVENOX) injection  90 mg Subcutaneous Q24H   guaiFENesin  1,200 mg Oral BID   loratadine  10 mg Oral Daily   oxyCODONE  15 mg Oral Q12H   pantoprazole  40 mg Oral Daily   pregabalin  25 mg Oral  BID   sertraline  100 mg Oral Daily   sodium chloride flush  3 mL Intravenous Q12H   Continuous Infusions:   ceFAZolin (ANCEF) IV       Anti-infectives (From admission, onward)    Start     Dose/Rate Route Frequency Ordered Stop   03/08/24 1000  ceFAZolin (ANCEF) IVPB 1 g/50 mL premix        1 g 100 mL/hr over 30 Minutes Intravenous Every 8 hours 03/08/24 0859     03/07/24 0100  vancomycin (VANCOREADY) IVPB 1500 mg/300 mL  Status:  Discontinued        1,500 mg 150 mL/hr over 120 Minutes Intravenous Every 24 hours 03/06/24 0833 03/08/24 0855   03/06/24 1000  ceFEPIme (MAXIPIME) 2 g in sodium chloride 0.9 % 100 mL IVPB   Status:  Discontinued        2 g 200 mL/hr over 30 Minutes Intravenous Every 8 hours 03/06/24 0823 03/08/24 0856   03/06/24 0900  vancomycin (VANCOREADY) IVPB 1500 mg/300 mL        1,500 mg 150 mL/hr over 120 Minutes Intravenous  Once 03/06/24 0823 03/06/24 1138   03/06/24 0200  ceFEPIme (MAXIPIME) 2 g in sodium chloride 0.9 % 100 mL IVPB        2 g 200 mL/hr over 30 Minutes Intravenous  Once 03/06/24 0157 03/06/24 0242   03/06/24 0200  metroNIDAZOLE (FLAGYL) IVPB 500 mg        500 mg 100 mL/hr over 60 Minutes Intravenous  Once 03/06/24 0157 03/06/24 0331   03/06/24 0200  vancomycin (VANCOCIN) IVPB 1000 mg/200 mL premix        1,000 mg 200 mL/hr over 60 Minutes Intravenous  Once 03/06/24 0157 03/06/24 1610              Family Communication/Anticipated D/C date and plan/Code Status   DVT prophylaxis:      Code Status: Full Code  Family Communication: None Disposition Plan: Plan to discharge home   Status is: Inpatient Remains inpatient appropriate because: CHF exacerbation, sepsis       Subjective:    Interval events noted.  She complains of lower abdominal pain.  No shortness of breath no vomiting no chest pain.  She is feeling better than yesterday.  Objective:    Vitals:   03/07/24 2359 03/08/24 0457 03/08/24 0500 03/08/24 0754  BP: (!) 107/53 122/66  130/65  Pulse: 75 66  64  Resp: 19 19    Temp: 98.2 F (36.8 C) 98.2 F (36.8 C)  98.1 F (36.7 C)  TempSrc: Oral Oral    SpO2: 96% 98%  98%  Weight:   (!) 173.3 kg   Height:       No data found.   Intake/Output Summary (Last 24 hours) at 03/08/2024 0913 Last data filed at 03/08/2024 0630 Gross per 24 hour  Intake 643 ml  Output 600 ml  Net 43 ml   Filed Weights   03/06/24 0151 03/07/24 0500 03/08/24 0500  Weight: (!) 179.3 kg (!) 168.6 kg (!) 173.3 kg    Exam:  GEN: NAD SKIN: Warm and dry EYES: No pallor icterus, + trach ENT: MMM CV: RRR PULM: CTA B ABD: soft, obese, swelling,  induration and no tenderness over lower abdomen but no erythema, +BS CNS: AAO x 3, non focal EXT: No edema or tenderness      Data Reviewed:   I have personally reviewed following labs and imaging studies:  Labs: Labs  show the following:   Basic Metabolic Panel: Recent Labs  Lab 03/06/24 0157 03/07/24 0503 03/08/24 0400  NA 138 136 139  K 3.6 3.8 4.1  CL 103 101 99  CO2 27 26 28   GLUCOSE 159* 125* 93  BUN 22* 26* 30*  CREATININE 1.47* 1.57* 1.69*  CALCIUM 8.9 8.8* 9.0  MG  --   --  2.2   GFR Estimated Creatinine Clearance: 64.6 mL/min (A) (by C-G formula based on SCr of 1.69 mg/dL (H)). Liver Function Tests: Recent Labs  Lab 03/06/24 0157  AST 17  ALT 13  ALKPHOS 69  BILITOT 0.6  PROT 7.9  ALBUMIN 4.0   No results for input(s): "LIPASE", "AMYLASE" in the last 168 hours. No results for input(s): "AMMONIA" in the last 168 hours. Coagulation profile Recent Labs  Lab 03/06/24 0157  INR 1.1    CBC: Recent Labs  Lab 03/06/24 0157 03/08/24 0400  WBC 15.8* 11.7*  NEUTROABS 13.9* 7.4  HGB 10.3* 9.7*  HCT 32.6* 30.6*  MCV 83.4 82.5  PLT 222 238   Cardiac Enzymes: No results for input(s): "CKTOTAL", "CKMB", "CKMBINDEX", "TROPONINI" in the last 168 hours. BNP (last 3 results) No results for input(s): "PROBNP" in the last 8760 hours. CBG: Recent Labs  Lab 03/06/24 2033  GLUCAP 167*   D-Dimer: No results for input(s): "DDIMER" in the last 72 hours. Hgb A1c: No results for input(s): "HGBA1C" in the last 72 hours. Lipid Profile: No results for input(s): "CHOL", "HDL", "LDLCALC", "TRIG", "CHOLHDL", "LDLDIRECT" in the last 72 hours. Thyroid function studies: No results for input(s): "TSH", "T4TOTAL", "T3FREE", "THYROIDAB" in the last 72 hours.  Invalid input(s): "FREET3" Anemia work up: No results for input(s): "VITAMINB12", "FOLATE", "FERRITIN", "TIBC", "IRON", "RETICCTPCT" in the last 72 hours. Sepsis Labs: Recent Labs  Lab 03/06/24 0157  03/08/24 0400  PROCALCITON <0.10  --   WBC 15.8* 11.7*  LATICACIDVEN 1.4  --     Microbiology Recent Results (from the past 240 hours)  Blood Culture (routine x 2)     Status: None (Preliminary result)   Collection Time: 03/06/24  1:56 AM   Specimen: BLOOD  Result Value Ref Range Status   Specimen Description BLOOD RH  Final   Special Requests   Final    BOTTLES DRAWN AEROBIC AND ANAEROBIC Blood Culture results may not be optimal due to an inadequate volume of blood received in culture bottles   Culture   Final    NO GROWTH 2 DAYS Performed at [facility], [address] [place], [place] [address]    Report Status PENDING  Incomplete  Resp panel by RT-PCR (RSV, Flu A&B, Covid) Anterior Nasal Swab     Status: None   Collection Time: 03/06/24  2:37 AM   Specimen: Anterior Nasal Swab  Result Value Ref Range Status   SARS Coronavirus 2 by RT PCR NEGATIVE NEGATIVE Final    Comment: (NOTE) SARS-CoV-2 target nucleic acids are NOT DETECTED.  The SARS-CoV-2 RNA is generally detectable in upper respiratory specimens during the acute phase of infection. The lowest concentration of SARS-CoV-2 viral copies this assay can detect is 138 copies/mL. A negative result does not preclude SARS-Cov-2 infection and should not be used as the sole basis for treatment or other patient management decisions. A negative result may occur with  improper specimen collection/handling, submission of specimen other than nasopharyngeal swab, presence of viral mutation(s) within the areas targeted by this assay, and inadequate number of viral copies(<138 copies/mL). A  negative result must be combined with clinical observations, patient history, and epidemiological information. The expected result is Negative.  Fact Sheet for Patients:  BloggerCourse.com  Fact Sheet for Healthcare Providers:  SeriousBroker.it  This test is no t yet approved or  cleared by the [place] FDA and  has been authorized for detection and/or diagnosis of SARS-CoV-2 by FDA under an Emergency Use Authorization (EUA). This EUA will remain  in effect (meaning this test can be used) for the duration of the COVID-19 declaration under Section 564(b)(1) of the Act, 21 U.S.C.section 360bbb-3(b)(1), unless the authorization is terminated  or revoked sooner.       Influenza A by PCR NEGATIVE NEGATIVE Final   Influenza B by PCR NEGATIVE NEGATIVE Final    Comment: (NOTE) The Xpert Xpress SARS-CoV-2/FLU/RSV plus assay is intended as an aid in the diagnosis of influenza from Nasopharyngeal swab specimens and should not be used as a sole basis for treatment. Nasal washings and aspirates are unacceptable for Xpert Xpress SARS-CoV-2/FLU/RSV testing.  Fact Sheet for Patients: BloggerCourse.com  Fact Sheet for Healthcare Providers: SeriousBroker.it  This test is not yet approved or cleared by the [place] FDA and has been authorized for detection and/or diagnosis of SARS-CoV-2 by FDA under an Emergency Use Authorization (EUA). This EUA will remain in effect (meaning this test can be used) for the duration of the COVID-19 declaration under Section 564(b)(1) of the Act, 21 U.S.C. section 360bbb-3(b)(1), unless the authorization is terminated or revoked.     Resp Syncytial Virus by PCR NEGATIVE NEGATIVE Final    Comment: (NOTE) Fact Sheet for Patients: BloggerCourse.com  Fact Sheet for Healthcare Providers: SeriousBroker.it  This test is not yet approved or cleared by the [place] FDA and has been authorized for detection and/or diagnosis of SARS-CoV-2 by FDA under an Emergency Use Authorization (EUA). This EUA will remain in effect (meaning this test can be used) for the duration of the COVID-19 declaration under Section 564(b)(1) of the Act, 21  U.S.C. section 360bbb-3(b)(1), unless the authorization is terminated or revoked.  Performed at [facility], [address] [place], [place] [address]   Blood Culture (routine x 2)     Status: None (Preliminary result)   Collection Time: 03/06/24  3:05 AM   Specimen: BLOOD  Result Value Ref Range Status   Specimen Description BLOOD BLOOD RIGHT ARM  Final   Special Requests   Final    BOTTLES DRAWN AEROBIC AND ANAEROBIC Blood Culture adequate volume   Culture   Final    NO GROWTH 2 DAYS Performed at [facility], [address], [place], [place] [address]    Report Status PENDING  Incomplete    Procedures and diagnostic studies:  DG Chest 1 View Result Date: 03/06/2024 CLINICAL DATA:  [id number] CHF (congestive heart failure) (HCC) 97293 EXAM: CHEST  1 VIEW COMPARISON:  Earlier film of the same day FINDINGS: Stable tracheostomy device. Marginal increase in pulmonary vascular congestion. Heart size upper limits normal for technique. No pneumothorax. No evident pneumomediastinum. No effusion. Visualized bones unremarkable. IMPRESSION: Marginal increase in pulmonary vascular congestion. Electronically Signed   By: Corlis Leak M.D.   On: 03/06/2024 10:49               LOS: 2 days   Cieanna Stormes  [facility]   Pager on [email or web address]. If 7PM-7AM, please contact night-coverage at [email or web address]     03/08/2024, 9:13 AM

## 2024-03-08 NOTE — TOC Progression Note (Signed)
 Transition of Care [facility]) - Progression Note    Patient Details  Name: Sandra Holloway MRN: [id number] Date of Birth: 06-Jul-1976  Transition of Care [facility]) CM/SW Contact  Truddie Hidden, RN Phone Number: 03/08/2024, 12:48 PM  Clinical Narrative:    TOC continuing to follow patient's progress throughout discharge planning.        Expected Discharge Plan and Services                                               Social Determinants of Health (SDOH) Interventions SDOH Screenings   Food Insecurity: No Food Insecurity (03/06/2024)  Housing: Low Risk  (03/06/2024)  Transportation Needs: No Transportation Needs (03/06/2024)  Utilities: Not At Risk (03/06/2024)  Social Connections: Moderately Isolated (03/06/2024)  Tobacco Use: Medium Risk (03/06/2024)    Readmission Risk Interventions     No data to display

## 2024-03-08 NOTE — Plan of Care (Signed)
  Problem: Clinical Measurements: Goal: Respiratory complications will improve Outcome: Progressing Goal: Cardiovascular complication will be avoided Outcome: Progressing   Problem: Activity: Goal: Risk for activity intolerance will decrease Outcome: Progressing   Problem: Nutrition: Goal: Adequate nutrition will be maintained Outcome: Progressing   Problem: Coping: Goal: Level of anxiety will decrease Outcome: Progressing   Problem: Pain Managment: Goal: General experience of comfort will improve and/or be controlled Outcome: Progressing   Problem: Safety: Goal: Ability to remain free from injury will improve Outcome: Progressing

## 2024-03-09 DIAGNOSIS — A419 Sepsis, unspecified organism: Secondary | ICD-10-CM | POA: Diagnosis not present

## 2024-03-09 LAB — BASIC METABOLIC PANEL WITH GFR
Anion gap: 9 (ref 5–15)
BUN: 27 mg/dL — ABNORMAL HIGH (ref 6–20)
CO2: 27 mmol/L (ref 22–32)
Calcium: 9.1 mg/dL (ref 8.9–10.3)
Chloride: 99 mmol/L (ref 98–111)
Creatinine, Ser: 1.34 mg/dL — ABNORMAL HIGH (ref 0.44–1.00)
GFR, Estimated: 49 mL/min — ABNORMAL LOW (ref >60)
Glucose, Bld: 107 mg/dL — ABNORMAL HIGH (ref 70–99)
Potassium: 4.3 mmol/L (ref 3.5–5.1)
Sodium: 135 mmol/L (ref 135–145)

## 2024-03-09 MED ORDER — CEFADROXIL 500 MG PO CAPS: 500.0000 mg | ORAL_CAPSULE | Freq: Two times a day (BID) | ORAL | 0 refills | Status: AC

## 2024-03-09 MED ORDER — FUROSEMIDE 40 MG PO TABS
40.0000 mg | ORAL_TABLET | Freq: Every day | ORAL | Status: DC
  Administered 2024-03-09: 40 mg via ORAL
  Filled 2024-03-09: qty 2

## 2024-03-09 NOTE — TOC Transition Note (Signed)
 Transition of Care [facility]) - Discharge Note   Patient Details  Name: Sandra Holloway MRN: [id number] Date of Birth: 1976/03/01  Transition of Care [facility]) CM/SW Contact:  Truddie Hidden, RN Phone Number: 03/09/2024, 1:06 PM   Clinical Narrative:    Attempt to speak with patient's significant other regarding needs for home oxygen to discharge. He stated adamantly he would not return home to get patient's home oxygen tank.   12:50pm Spoke with patient at bedside with Mitch from [employer] on the line regarding discharge home. Nurse and nursing student, and patient's significant other was in the room. Patient can be provided a loaner tank from [employer] with an expedited request. She was advised of loaner tanke. Patient stated "I love life, I promise I can make it." Patient was advised against leaving the hospital without her oxygen and  informed it was not safe to do so. She refuses to wait for a loaner tank. She was advised for future admissions home oxygen tanks are needed for discharge. MD and charge nurse notified.   TOC signing off.          Patient Goals and CMS Choice            Discharge Placement                       Discharge Plan and Services Additional resources added to the After Visit Summary for                                       Social Drivers of Health (SDOH) Interventions SDOH Screenings   Food Insecurity: No Food Insecurity (03/06/2024)  Housing: Low Risk  (03/06/2024)  Transportation Needs: No Transportation Needs (03/06/2024)  Utilities: Not At Risk (03/06/2024)  Social Connections: Moderately Isolated (03/06/2024)  Tobacco Use: Medium Risk (03/06/2024)     Readmission Risk Interventions     No data to display

## 2024-03-09 NOTE — Progress Notes (Signed)
 Pt  boyfriend came to pick her up but had no Oxygen tank with him.  He said there was a tank in his car but was empty and refused to go get a another tank for her. Pt and boyfriend was told that she need her O2 tank to go out of the hospital since she is on 6L Oxygen. CN and primary nurse talk with patient about the risk of going out without Oxygen. Pt said she can sign any document to say that she is fine without Oxygen. Provider Dr. Adela Glimpse was notified. CM also came at bedside to talk with pt to wait for oxygen tank to be delivered in an hr but pt and boyfriend decided to leave.

## 2024-03-09 NOTE — Discharge Summary (Signed)
 Physician Discharge Summary   Patient: Sandra Holloway MRN: [id number] DOB: 01/30/1976  Admit date:     03/06/2024  Discharge date: 03/09/24  Discharge Physician: Lurene Shadow   PCP: Dairl Ponder, MD   Recommendations at discharge:   Follow-up with PCP in 1 week  Discharge Diagnoses: Principal Problem:   Sepsis due to undetermined organism [facility]) Active Problems:   Sepsis (HCC)   Acute on chronic diastolic CHF (congestive heart failure) (HCC)  Resolved Problems:   * No resolved hospital problems. *  Hospital Course:  Sandra Holloway is a 48 y.o. female  with medical history significant of HTN/chronic HFpEF, subglottic stenosis and chronic hypoxic respiratory failure status post tracheostomy on 6 L via trach collar and ventilator support HS, morbid obesity, presented with multiple complaints including worsening of rash with pain on the lower abdominal wall and increasing cough and shortness of breath.   Patient started to have increasing productive cough and shortness of breath for past week, with clear phlegm production, and orthopnea.  She also had peripheral edema.  She said she had not been taking her Lasix regularly and sometimes does not take Lasix when she goes out.  She also reported rash on lower abdominal wall that started several days prior to admission.     ED Course: Fever 103, tachypneic tachycardia 108 blood pressure 150/50 with hypoxia O2 saturation 98% on 8 L / 40%.  Chest x-ray showed signs of pulmonary edema and cardiomegaly.  Blood work showed WBC 15.8 hemoglobin 10.3 creatinine 1.14 her baseline 1.2-1.5 K3.6 bicarb 27.  VBG 7.3 1/58/3.   Patient was given vancomycin and cefepime, IV Solu-Medrol and DuoNeb treatment.    Assessment and Plan:   Sepsis secondary to lower abdominal wall cellulitis/ panniculitis: Improved.  Discontinue IV Ancef.  She will be discharged on cefadroxil for 7 more days to complete 10 days of treatment.  Analgesics as needed for pain.   No growth on blood cultures thus far.       Acute on chronic HFpEF: Improved.  Continue oral Lasix at discharge.  Monitor BMP, daily weight and urine output. 2D echo showed EF estimated at 55 to 60%, indeterminate LV diastolic parameters     Acute on chronic hypoxic respiratory failure: She is tolerating 6 L/min oxygen via trach collar.  She uses 6 L via trach collar at baseline.     Moderate intermittent asthma: Doubt asthma exacerbation at this time.  Suspect wheezing was likely from CHF exacerbation. Continue bronchodilators at discharge. S/p treatment with IV Solu-Medrol.     CKD stage 3B: Creatinine is stable.  Creatinine down from 1.69-1.34.   Baseline creatinine around 1.4. Of note, patient said she is not aware of CKD diagnosis.  However, chart review showed that she saw her PCP, Dr. Dalbert Mayotte, on 09/23/2023, and CKD stage IIIb was listed as one of the diagnoses.  Patient has been explained in detail the diagnosis and management of CKD.     Her condition has improved and she is deemed stable for discharge to home today. Notably, her boyfriend came to pick her up without her oxygen tank.  He was advised to go back home and bring patient's oxygen tank but he refused.  Patient decided she would go home without her oxygen and she would be fine.  She was strongly advised against this because of potential complications that may arise without oxygen.  She was offered oxygen tank from the hospital.  Chana Bode, case manager, was consulted to assist  with oxygen tank at discharge.  Patient was informed that it may take an hour to have oxygen tank delivered to the bedside.  However, patient and her boyfriend left without an oxygen tank.         Consultants: None Procedures performed: None Disposition: Home Diet recommendation:  Discharge Diet Orders (From admission, onward)     Start     Ordered   03/09/24 0000  Diet - low sodium heart healthy        03/09/24 1046   03/09/24 0000  Diet  Carb Modified        03/09/24 1046           Cardiac and Carb modified diet DISCHARGE MEDICATION: Allergies as of 03/09/2024   No Known Allergies      Medication List     STOP taking these medications    albuterol 108 (90 Base) MCG/ACT inhaler Commonly known as: VENTOLIN HFA   DULoxetine 30 MG capsule Commonly known as: CYMBALTA   ipratropium-albuterol 0.5-2.5 (3) MG/3ML Soln Commonly known as: DUONEB   oxyCODONE-acetaminophen 10-325 MG tablet Commonly known as: PERCOCET   triamcinolone cream 0.1 % Commonly known as: KENALOG   Xtampza ER 9 MG C12a Generic drug: oxyCODONE ER       TAKE these medications    acetaminophen 325 MG tablet Commonly known as: TYLENOL Take 2 tablets (650 mg total) by mouth every 6 (six) hours as needed for mild pain (or Fever >/= 101).   cefadroxil 500 MG capsule Commonly known as: DURICEF Take 1 capsule (500 mg total) by mouth 2 (two) times daily for 7 days.   cyclobenzaprine 5 MG tablet Commonly known as: FLEXERIL Take 5 mg by mouth at bedtime.   furosemide 40 MG tablet Commonly known as: LASIX Take 40 mg by mouth daily.   loratadine 10 MG tablet Commonly known as: CLARITIN Take 10 mg by mouth daily.   omeprazole 20 MG capsule Commonly known as: PRILOSEC Take 20 mg by mouth daily.   OxyCONTIN 15 MG 12 hr tablet Generic drug: oxyCODONE Take 15 mg by mouth every 12 (twelve) hours.   oxyCODONE 15 MG immediate release tablet Commonly known as: ROXICODONE Take 15 mg by mouth every 6 (six) hours.   pregabalin 25 MG capsule Commonly known as: LYRICA Take 25 mg by mouth 2 (two) times daily.   sertraline 100 MG tablet Commonly known as: ZOLOFT Take 100 mg by mouth daily.   Wegovy 1.7 MG/0.75ML Soaj Generic drug: Semaglutide-Weight Management Inject 1.7 mg into the skin every 7 (seven) days.        Discharge Exam: Filed Weights   03/07/24 0500 03/08/24 0500 03/09/24 0500  Weight: (!) 168.6 kg (!) 173.3 kg  (!) 175 kg   GEN: NAD SKIN: Warm and dry EYES: No pallor or icterus ENT: MMM CV: RRR PULM: CTA B ABD: soft, obese, NT, +BS, + lower abdominal pannus with mild induration and tenderness CNS: AAO x 3, non focal EXT: No edema or tenderness   Condition at discharge: good  The results of significant diagnostics from this hospitalization (including imaging, microbiology, ancillary and laboratory) are listed below for reference.   Imaging Studies: ECHOCARDIOGRAM COMPLETE Result Date: 03/08/2024    ECHOCARDIOGRAM REPORT   Patient Name:   JUELLE DICKMANN Date of Exam: 03/08/2024 Medical Rec #:  [id number]      Height:       62.0 in Accession #:    [id number]     Weight:  382.0 lb Date of Birth:  Jul 27, 1976      BSA:          2.517 m Patient Age:    47 years       BP:           122/66 mmHg Patient Gender: F              HR:           66 bpm. Exam Location:  [facility] Procedure: 2D Echo, Cardiac Doppler and Color Doppler (Both Spectral and Color            Flow Doppler were utilized during procedure). Indications:     CHF-acute diastolic I50..31  History:         Patient has prior history of Echocardiogram examinations, most                  recent 10/18/2022. Signs/Symptoms:Dyspnea; Risk                  Factors:Hypertension.  Sonographer:     Cristela Blue Referring Phys:  [id number] Emeline General Diagnosing Phys: Julien Nordmann MD  Sonographer Comments: Technically challenging study due to limited acoustic windows, no apical window and no subcostal window. IMPRESSIONS  1. Left ventricular ejection fraction, by estimation, is 55 to 60%. The left ventricle has normal function. The left ventricle has no regional wall motion abnormalities. Left ventricular diastolic parameters are indeterminate.  2. Right ventricular systolic function is normal. The right ventricular size is normal.  3. The mitral valve is normal in structure. No evidence of mitral valve regurgitation. No evidence of mitral stenosis.  4. The aortic  valve is normal in structure. Aortic valve regurgitation is not visualized. No aortic stenosis is present.  5. The inferior vena cava is normal in size with greater than 50% respiratory variability, suggesting right atrial pressure of 3 mmHg. FINDINGS  Left Ventricle: Left ventricular ejection fraction, by estimation, is 55 to 60%. The left ventricle has normal function. The left ventricle has no regional wall motion abnormalities. Strain was performed and the global longitudinal strain is indeterminate. The left ventricular internal cavity size was normal in size. There is no left ventricular hypertrophy. Left ventricular diastolic parameters are indeterminate. Right Ventricle: The right ventricular size is normal. No increase in right ventricular wall thickness. Right ventricular systolic function is normal. Left Atrium: Left atrial size was normal in size. Right Atrium: Right atrial size was normal in size. Pericardium: There is no evidence of pericardial effusion. Mitral Valve: The mitral valve is normal in structure. No evidence of mitral valve regurgitation. No evidence of mitral valve stenosis. Tricuspid Valve: The tricuspid valve is normal in structure. Tricuspid valve regurgitation is mild . No evidence of tricuspid stenosis. Aortic Valve: The aortic valve is normal in structure. Aortic valve regurgitation is not visualized. No aortic stenosis is present. Pulmonic Valve: The pulmonic valve was normal in structure. Pulmonic valve regurgitation is not visualized. No evidence of pulmonic stenosis. Aorta: The aortic root is normal in size and structure. Venous: The inferior vena cava is normal in size with greater than 50% respiratory variability, suggesting right atrial pressure of 3 mmHg. IAS/Shunts: No atrial level shunt detected by color flow Doppler. Additional Comments: 3D was performed not requiring image post processing on an independent workstation and was indeterminate.  LEFT VENTRICLE PLAX 2D LVIDd:          5.50 cm LVIDs:  4.00 cm LV PW:         1.10 cm LV IVS:        1.10 cm LVOT diam:     2.10 cm LVOT Area:     3.46 cm  LEFT ATRIUM         Index LA diam:    3.80 cm 1.51 cm/m   AORTA Ao Root diam: 2.60 cm  SHUNTS Systemic Diam: 2.10 cm Julien Nordmann MD Electronically signed by Julien Nordmann MD Signature Date/Time: 03/08/2024/5:53:33 PM    Final    DG Chest 1 View Result Date: 03/06/2024 CLINICAL DATA:  [id number] CHF (congestive heart failure) (HCC) 01027 EXAM: CHEST  1 VIEW COMPARISON:  Earlier film of the same day FINDINGS: Stable tracheostomy device. Marginal increase in pulmonary vascular congestion. Heart size upper limits normal for technique. No pneumothorax. No evident pneumomediastinum. No effusion. Visualized bones unremarkable. IMPRESSION: Marginal increase in pulmonary vascular congestion. Electronically Signed   By: Corlis Leak M.D.   On: 03/06/2024 10:49   CT ABDOMEN PELVIS W CONTRAST Result Date: 03/06/2024 CLINICAL DATA:  Sepsis and abdominal wall cellulitis, evaluate for abscess. EXAM: CT ABDOMEN AND PELVIS WITH CONTRAST TECHNIQUE: Multidetector CT imaging of the abdomen and pelvis was performed using the standard protocol following bolus administration of intravenous contrast. RADIATION DOSE REDUCTION: This exam was performed according to the departmental dose-optimization program which includes automated exposure control, adjustment of the mA and/or kV according to patient size and/or use of iterative reconstruction technique. CONTRAST:  80mL OMNIPAQUE IOHEXOL 350 MG/ML SOLN COMPARISON:  09/29/2022 FINDINGS: Lower chest: Hazy and streaky density at the lung bases attributed atelectasis. Hepatobiliary: No focal liver abnormality.No evidence of biliary obstruction or stone. Pancreas: Unremarkable. Spleen: Unremarkable. Adrenals/Urinary Tract: Negative adrenals. Exophytic right renal lesion with ~ 40 Hounsfield unit densitometry, 2 cm in size which is stable. No hydronephrosis.  Unremarkable bladder. Stomach/Bowel:  No obstruction. No visible bowel inflammation Vascular/Lymphatic: No acute vascular abnormality. No mass or adenopathy. Reproductive:No pathologic findings. Other: Subcutaneous reticulation with skin thickening at the lower abdominal wall, similar appearance was seen where partially covered previously. Small fatty umbilical hernia. Musculoskeletal: No acute abnormalities. IMPRESSION: 1. Panniculitis, chronic or recurrent when compared to 2023 CT, without abscess or soft tissue gas. No evidence of intra-abdominal inflammation. 2. 2 cm right renal lesion with densitometry indeterminate between solid and cystic nature, but reassuring size stability since at least 2023. palpation ultrasound could be attempted to further evaluate. 3. Small fatty umbilical hernia. Electronically Signed   By: Tiburcio Pea M.D.   On: 03/06/2024 06:45   DG Chest Port 1 View Result Date: 03/06/2024 CLINICAL DATA:  Shortness of breath and lower abdominal cellulitis. EXAM: PORTABLE CHEST 1 VIEW COMPARISON:  September 09, 2023 FINDINGS: There is stable tracheostomy tube positioning. The cardiac silhouette is mildly enlarged and unchanged in size. There is mild prominence of the central pulmonary vasculature. Mild atelectasis and/or infiltrate is seen within the right lung base. No pleural effusion or pneumothorax is identified. The visualized skeletal structures are unremarkable. IMPRESSION: 1. Stable cardiomegaly with mild central pulmonary vascular congestion. 2. Mild right basilar atelectasis and/or infiltrate. Electronically Signed   By: Aram Candela M.D.   On: 03/06/2024 03:36    Microbiology: Results for orders placed or performed during the hospital encounter of 03/06/24  Blood Culture (routine x 2)     Status: None (Preliminary result)   Collection Time: 03/06/24  1:56 AM   Specimen: BLOOD  Result Value Ref Range Status  Specimen Description BLOOD RH  Final   Special Requests    Final    BOTTLES DRAWN AEROBIC AND ANAEROBIC Blood Culture results may not be optimal due to an inadequate volume of blood received in culture bottles   Culture   Final    NO GROWTH 3 DAYS Performed at [facility], [address] [place], [place] [address]    Report Status PENDING  Incomplete  Resp panel by RT-PCR (RSV, Flu A&B, Covid) Anterior Nasal Swab     Status: None   Collection Time: 03/06/24  2:37 AM   Specimen: Anterior Nasal Swab  Result Value Ref Range Status   SARS Coronavirus 2 by RT PCR NEGATIVE NEGATIVE Final    Comment: (NOTE) SARS-CoV-2 target nucleic acids are NOT DETECTED.  The SARS-CoV-2 RNA is generally detectable in upper respiratory specimens during the acute phase of infection. The lowest concentration of SARS-CoV-2 viral copies this assay can detect is 138 copies/mL. A negative result does not preclude SARS-Cov-2 infection and should not be used as the sole basis for treatment or other patient management decisions. A negative result may occur with  improper specimen collection/handling, submission of specimen other than nasopharyngeal swab, presence of viral mutation(s) within the areas targeted by this assay, and inadequate number of viral copies(<138 copies/mL). A negative result must be combined with clinical observations, patient history, and epidemiological information. The expected result is Negative.  Fact Sheet for Patients:  BloggerCourse.com  Fact Sheet for Healthcare Providers:  SeriousBroker.it  This test is no t yet approved or cleared by the [place] FDA and  has been authorized for detection and/or diagnosis of SARS-CoV-2 by FDA under an Emergency Use Authorization (EUA). This EUA will remain  in effect (meaning this test can be used) for the duration of the COVID-19 declaration under Section 564(b)(1) of the Act, 21 U.S.C.section 360bbb-3(b)(1), unless the authorization is  terminated  or revoked sooner.       Influenza A by PCR NEGATIVE NEGATIVE Final   Influenza B by PCR NEGATIVE NEGATIVE Final    Comment: (NOTE) The Xpert Xpress SARS-CoV-2/FLU/RSV plus assay is intended as an aid in the diagnosis of influenza from Nasopharyngeal swab specimens and should not be used as a sole basis for treatment. Nasal washings and aspirates are unacceptable for Xpert Xpress SARS-CoV-2/FLU/RSV testing.  Fact Sheet for Patients: BloggerCourse.com  Fact Sheet for Healthcare Providers: SeriousBroker.it  This test is not yet approved or cleared by the [place] FDA and has been authorized for detection and/or diagnosis of SARS-CoV-2 by FDA under an Emergency Use Authorization (EUA). This EUA will remain in effect (meaning this test can be used) for the duration of the COVID-19 declaration under Section 564(b)(1) of the Act, 21 U.S.C. section 360bbb-3(b)(1), unless the authorization is terminated or revoked.     Resp Syncytial Virus by PCR NEGATIVE NEGATIVE Final    Comment: (NOTE) Fact Sheet for Patients: BloggerCourse.com  Fact Sheet for Healthcare Providers: SeriousBroker.it  This test is not yet approved or cleared by the [place] FDA and has been authorized for detection and/or diagnosis of SARS-CoV-2 by FDA under an Emergency Use Authorization (EUA). This EUA will remain in effect (meaning this test can be used) for the duration of the COVID-19 declaration under Section 564(b)(1) of the Act, 21 U.S.C. section 360bbb-3(b)(1), unless the authorization is terminated or revoked.  Performed at [facility], [address] [place], [place] [address]   Blood Culture (routine x 2)  Status: None (Preliminary result)   Collection Time: 03/06/24  3:05 AM   Specimen: BLOOD  Result Value Ref Range Status   Specimen Description BLOOD BLOOD  RIGHT ARM  Final   Special Requests   Final    BOTTLES DRAWN AEROBIC AND ANAEROBIC Blood Culture adequate volume   Culture   Final    NO GROWTH 3 DAYS Performed at [facility], [address] [place], [place] [address]    Report Status PENDING  Incomplete    Labs: CBC: Recent Labs  Lab 03/06/24 0157 03/08/24 0400  WBC 15.8* 11.7*  NEUTROABS 13.9* 7.4  HGB 10.3* 9.7*  HCT 32.6* 30.6*  MCV 83.4 82.5  PLT 222 238   Basic Metabolic Panel: Recent Labs  Lab 03/06/24 0157 03/07/24 0503 03/08/24 0400 03/09/24 0449  NA 138 136 139 135  K 3.6 3.8 4.1 4.3  CL 103 101 99 99  CO2 27 26 28 27   GLUCOSE 159* 125* 93 107*  BUN 22* 26* 30* 27*  CREATININE 1.47* 1.57* 1.69* 1.34*  CALCIUM 8.9 8.8* 9.0 9.1  MG  --   --  2.2  --    Liver Function Tests: Recent Labs  Lab 03/06/24 0157  AST 17  ALT 13  ALKPHOS 69  BILITOT 0.6  PROT 7.9  ALBUMIN 4.0   CBG: Recent Labs  Lab 03/06/24 2033  GLUCAP 167*    Discharge time spent: greater than 30 minutes.  Signed: Lurene Shadow, MD [facility] 03/09/2024

## 2024-03-09 NOTE — Plan of Care (Signed)
  Problem: Clinical Measurements: Goal: Ability to maintain clinical measurements within normal limits will improve Outcome: Progressing Goal: Will remain free from infection Outcome: Progressing Goal: Cardiovascular complication will be avoided Outcome: Progressing   Problem: Activity: Goal: Risk for activity intolerance will decrease Outcome: Progressing   Problem: Coping: Goal: Level of anxiety will decrease Outcome: Progressing   Problem: Elimination: Goal: Will not experience complications related to urinary retention Outcome: Progressing   Problem: Pain Managment: Goal: General experience of comfort will improve and/or be controlled Outcome: Progressing   Problem: Safety: Goal: Ability to remain free from injury will improve Outcome: Progressing   Problem: Skin Integrity: Goal: Risk for impaired skin integrity will decrease Outcome: Progressing   Problem: Skin Integrity: Goal: Skin integrity will improve Outcome: Progressing

## 2024-03-11 LAB — CULTURE, BLOOD (ROUTINE X 2)
Culture: NO GROWTH
Culture: NO GROWTH
Special Requests: ADEQUATE

## 2024-06-12 ENCOUNTER — Emergency Department

## 2024-06-12 ENCOUNTER — Emergency Department
Admission: EM | Admit: 2024-06-12 | Discharge: 2024-06-12 | Disposition: A | Discharge status: Home / Self Care | Attending: Emergency Medicine | Admitting: Emergency Medicine

## 2024-06-12 ENCOUNTER — Other Ambulatory Visit: Payer: Self-pay

## 2024-06-12 DIAGNOSIS — R0600 Dyspnea, unspecified: Secondary | ICD-10-CM | POA: Insufficient documentation

## 2024-06-12 DIAGNOSIS — N289 Disorder of kidney and ureter, unspecified: Secondary | ICD-10-CM | POA: Insufficient documentation

## 2024-06-12 DIAGNOSIS — I509 Heart failure, unspecified: Secondary | ICD-10-CM | POA: Insufficient documentation

## 2024-06-12 DIAGNOSIS — R0602 Shortness of breath: Secondary | ICD-10-CM | POA: Diagnosis present

## 2024-06-12 DIAGNOSIS — I110 Hypertensive heart disease with heart failure: Secondary | ICD-10-CM | POA: Insufficient documentation

## 2024-06-12 LAB — COMPREHENSIVE METABOLIC PANEL WITH GFR
ALT: 18 U/L (ref 0–44)
AST: 21 U/L (ref 15–41)
Albumin: 3.9 g/dL (ref 3.5–5.0)
Alkaline Phosphatase: 61 U/L (ref 38–126)
Anion gap: 8 (ref 5–15)
BUN: 25 mg/dL — ABNORMAL HIGH (ref 6–20)
CO2: 29 mmol/L (ref 22–32)
Calcium: 9.4 mg/dL (ref 8.9–10.3)
Chloride: 101 mmol/L (ref 98–111)
Creatinine, Ser: 1.53 mg/dL — ABNORMAL HIGH (ref 0.44–1.00)
GFR, Estimated: 42 mL/min — ABNORMAL LOW (ref >60)
Glucose, Bld: 99 mg/dL (ref 70–99)
Potassium: 4.4 mmol/L (ref 3.5–5.1)
Sodium: 138 mmol/L (ref 135–145)
Total Bilirubin: 0.5 mg/dL (ref 0.0–1.2)
Total Protein: 8 g/dL (ref 6.5–8.1)

## 2024-06-12 LAB — CBC
HCT: 32.2 % — ABNORMAL LOW (ref 36.0–46.0)
Hemoglobin: 10.3 g/dL — ABNORMAL LOW (ref 12.0–15.0)
MCH: 26 pg (ref 26.0–34.0)
MCHC: 32 g/dL (ref 30.0–36.0)
MCV: 81.3 fL (ref 80.0–100.0)
Platelets: 234 K/uL (ref 150–400)
RBC: 3.96 MIL/uL (ref 3.87–5.11)
RDW: 16.9 % — ABNORMAL HIGH (ref 11.5–15.5)
WBC: 9.2 K/uL (ref 4.0–10.5)
nRBC: 0 % (ref 0.0–0.2)

## 2024-06-12 LAB — TROPONIN I (HIGH SENSITIVITY): Troponin I (High Sensitivity): 7 ng/L (ref <18)

## 2024-06-12 LAB — BRAIN NATRIURETIC PEPTIDE: B Natriuretic Peptide: 11.4 pg/mL (ref 0.0–100.0)

## 2024-06-12 MED ORDER — HYDROMORPHONE HCL 1 MG/ML IJ SOLN
0.5000 mg | Freq: Once | INTRAMUSCULAR | Status: AC
  Administered 2024-06-12: 0.5 mg via INTRAVENOUS
  Filled 2024-06-12: qty 0.5

## 2024-06-12 NOTE — ED Triage Notes (Signed)
 Pt arrived via [facility] from home with c/o mucus plug stuck in airway where pts trach resides. Pt also c/o increased lower extremity edema and rash to the lower abdomen. Pt arrived on 10L oxygen by trach.

## 2024-06-12 NOTE — ED Notes (Signed)
 called to [facility] per RN Venetia @[phone]  am/Rep Jinnie.

## 2024-06-12 NOTE — ED Triage Notes (Signed)
 Arrives [facility] from home with shortness of breath suspected result of a mucous plug.   Suctioned herself and feels better but thinks it is still in there.

## 2024-06-12 NOTE — ED Provider Notes (Signed)
 [facility] Provider Note    Event Date/Time   First MD Initiated Contact with Patient 06/12/24 0040     (approximate)  History   Chief Complaint: Shortness of Breath  HPI  Sandra Holloway is a 48 y.o. female with a past medical history of a tracheostomy, hypertension, obesity, CHF, presents to the emergency department for worsening shortness of breath.  According to the patient patient is coming from home, states she has been experiencing increased shortness of breath.  Patient is concerned she may have mucous plugging.  Patient has lower extreme edema but states she has been taking her fluid pills as prescribed.  Patient arrived on 10 L of oxygen, states she does not typically use oxygen but has available at home for when she needs it.  Patient denies any chest pain.  Patient attempted home tracheal suctioning with some relief.  Physical Exam   Triage Vital Signs: ED Triage Vitals  Encounter Vitals Group     BP 06/12/24 0040 (!) 160/75     Girls Systolic BP Percentile --      Girls Diastolic BP Percentile --      Boys Systolic BP Percentile --      Boys Diastolic BP Percentile --      Pulse Rate 06/12/24 0040 74     Resp 06/12/24 0043 18     Temp 06/12/24 0045 98.6 F (37 C)     Temp Source 06/12/24 0045 Oral     SpO2 06/12/24 0040 100 %     Weight --      Height --      Head Circumference --      Peak Flow --      Pain Score 06/12/24 0045 0     Pain Loc --      Pain Education --      Exclude from Growth Chart --     Most recent vital signs: Vitals:   06/12/24 0045 06/12/24 0045  BP:    Pulse:    Resp:    Temp:  98.6 F (37 C)  SpO2: 99%     General: Awake, no distress.  CV:  Good peripheral perfusion.  Regular rate and rhythm  Resp:  Normal effort.  Equal breath sounds bilaterally.  No obvious wheeze rales or rhonchi.  No significant mucus noted in visible tracheostomy. Abd:  No distention.  Soft, nontender.  No rebound or guarding.   Obese  ED Results / Procedures / Treatments   RADIOLOGY  I have reviewed interpret the chest x-ray images.  No obvious consolidation seen on my evaluation. Radiology is read the x-ray is improved vascular congestion.   MEDICATIONS ORDERED IN ED: Medications  HYDROmorphone  (DILAUDID ) injection 0.5 mg (has no administration in time range)     IMPRESSION / MDM / ASSESSMENT AND PLAN / ED COURSE  I reviewed the triage vital signs and the nursing notes.  Patient's presentation is most consistent with acute presentation with potential threat to life or bodily function.  Patient presents to the emergency department for shortness of breath, states she has had issues with mucous plugging in the past which this feels similar.  Patient denies any increased cough no fever.  Does state lower extremity edema, states this is chronic but possibly somewhat worse.  Patient is on fluid pills which she has been taking per patient.  We will check labs, we will obtain a chest x-ray.  Will have respiratory suction the patient to see  if this helps.  Patient states chronic pain we will dose a small amount of pain medication for the patient while awaiting workup.  Patient's chest x-ray shows no acute finding in fact improved from prior.  Patient's lab work shows a reassuring CBC with a normal white blood cell count, reassuring chemistry with chronic renal insufficiency.  BNP is normal, troponin is normal.  Patient is feeling somewhat better after suctioning.  Possibly was experiencing mucous plugging although her workup today is reassuring.  Will discharge the patient home patient will follow-up with her doctor.  Patient agreeable to plan as well.  FINAL CLINICAL IMPRESSION(S) / ED DIAGNOSES   Dyspnea   Note:  This document was prepared using Dragon voice recognition software and may include unintentional dictation errors.   Dorothyann Drivers, MD 06/12/24 [phone]

## 2024-06-12 NOTE — ED Notes (Signed)
 Discharge instructions reviewed.   Opportunity for questions and concerns provided.   Alert, oriented and escorted home by [facility] medical transport team on stretcher. .   Displays no signs of distress.

## 2024-06-12 NOTE — Progress Notes (Signed)
 RT called to assess patient with trach.  Patient setup with 35% ATC for humidification.  Patient setup with suction also.  Patient states she will suction herself after she has been on humidification for a little while.  Patients vital signs stable at this time.

## 2024-06-27 ENCOUNTER — Emergency Department

## 2024-06-27 ENCOUNTER — Other Ambulatory Visit: Payer: Self-pay

## 2024-06-27 ENCOUNTER — Inpatient Hospital Stay
Admission: EM | Admit: 2024-06-27 | Discharge: 2024-06-30 | DRG: 603 | Disposition: A | Discharge status: Home / Self Care | Attending: Internal Medicine | Admitting: Internal Medicine

## 2024-06-27 DIAGNOSIS — Z5320 Procedure and treatment not carried out because of patient's decision for unspecified reasons: Secondary | ICD-10-CM | POA: Diagnosis not present

## 2024-06-27 DIAGNOSIS — I5032 Chronic diastolic (congestive) heart failure: Secondary | ICD-10-CM | POA: Diagnosis present

## 2024-06-27 DIAGNOSIS — J9611 Chronic respiratory failure with hypoxia: Secondary | ICD-10-CM | POA: Diagnosis present

## 2024-06-27 DIAGNOSIS — J9610 Chronic respiratory failure, unspecified whether with hypoxia or hypercapnia: Secondary | ICD-10-CM

## 2024-06-27 DIAGNOSIS — I130 Hypertensive heart and chronic kidney disease with heart failure and stage 1 through stage 4 chronic kidney disease, or unspecified chronic kidney disease: Secondary | ICD-10-CM | POA: Diagnosis present

## 2024-06-27 DIAGNOSIS — Z9911 Dependence on respirator [ventilator] status: Secondary | ICD-10-CM

## 2024-06-27 DIAGNOSIS — N1831 Chronic kidney disease, stage 3a: Secondary | ICD-10-CM | POA: Diagnosis present

## 2024-06-27 DIAGNOSIS — Z87891 Personal history of nicotine dependence: Secondary | ICD-10-CM

## 2024-06-27 DIAGNOSIS — L03116 Cellulitis of left lower limb: Principal | ICD-10-CM | POA: Diagnosis present

## 2024-06-27 DIAGNOSIS — D631 Anemia in chronic kidney disease: Secondary | ICD-10-CM | POA: Diagnosis present

## 2024-06-27 DIAGNOSIS — E662 Morbid (severe) obesity with alveolar hypoventilation: Secondary | ICD-10-CM | POA: Diagnosis present

## 2024-06-27 DIAGNOSIS — F319 Bipolar disorder, unspecified: Secondary | ICD-10-CM | POA: Diagnosis present

## 2024-06-27 DIAGNOSIS — Z6841 Body Mass Index (BMI) 40.0 and over, adult: Secondary | ICD-10-CM

## 2024-06-27 DIAGNOSIS — Z79891 Long term (current) use of opiate analgesic: Secondary | ICD-10-CM

## 2024-06-27 DIAGNOSIS — Z930 Tracheostomy status: Secondary | ICD-10-CM

## 2024-06-27 DIAGNOSIS — G894 Chronic pain syndrome: Secondary | ICD-10-CM | POA: Diagnosis present

## 2024-06-27 DIAGNOSIS — K219 Gastro-esophageal reflux disease without esophagitis: Secondary | ICD-10-CM | POA: Diagnosis present

## 2024-06-27 DIAGNOSIS — Z79899 Other long term (current) drug therapy: Secondary | ICD-10-CM

## 2024-06-27 DIAGNOSIS — J45909 Unspecified asthma, uncomplicated: Secondary | ICD-10-CM | POA: Diagnosis present

## 2024-06-27 DIAGNOSIS — I10 Essential (primary) hypertension: Secondary | ICD-10-CM | POA: Diagnosis present

## 2024-06-27 LAB — BASIC METABOLIC PANEL WITH GFR
Anion gap: 11 (ref 5–15)
BUN: 19 mg/dL (ref 6–20)
CO2: 25 mmol/L (ref 22–32)
Calcium: 9 mg/dL (ref 8.9–10.3)
Chloride: 102 mmol/L (ref 98–111)
Creatinine, Ser: 1.24 mg/dL — ABNORMAL HIGH (ref 0.44–1.00)
GFR, Estimated: 54 mL/min — ABNORMAL LOW (ref >60)
Glucose, Bld: 94 mg/dL (ref 70–99)
Potassium: 4.2 mmol/L (ref 3.5–5.1)
Sodium: 138 mmol/L (ref 135–145)

## 2024-06-27 LAB — CBC WITH DIFFERENTIAL/PLATELET
Abs Immature Granulocytes: 0.05 K/uL (ref 0.00–0.07)
Basophils Absolute: 0 K/uL (ref 0.0–0.1)
Basophils Relative: 0 %
Eosinophils Absolute: 0.2 K/uL (ref 0.0–0.5)
Eosinophils Relative: 2 %
HCT: 30.6 % — ABNORMAL LOW (ref 36.0–46.0)
Hemoglobin: 9.5 g/dL — ABNORMAL LOW (ref 12.0–15.0)
Immature Granulocytes: 1 %
Lymphocytes Relative: 26 %
Lymphs Abs: 2.5 K/uL (ref 0.7–4.0)
MCH: 25.7 pg — ABNORMAL LOW (ref 26.0–34.0)
MCHC: 31 g/dL (ref 30.0–36.0)
MCV: 82.7 fL (ref 80.0–100.0)
Monocytes Absolute: 0.5 K/uL (ref 0.1–1.0)
Monocytes Relative: 6 %
Neutro Abs: 6.4 K/uL (ref 1.7–7.7)
Neutrophils Relative %: 65 %
Platelets: 279 K/uL (ref 150–400)
RBC: 3.7 MIL/uL — ABNORMAL LOW (ref 3.87–5.11)
RDW: 16.7 % — ABNORMAL HIGH (ref 11.5–15.5)
WBC: 9.7 K/uL (ref 4.0–10.5)
nRBC: 0 % (ref 0.0–0.2)

## 2024-06-27 MED ORDER — KETOROLAC TROMETHAMINE 30 MG/ML IJ SOLN
15.0000 mg | Freq: Once | INTRAMUSCULAR | Status: AC
  Administered 2024-06-27: 15 mg via INTRAVENOUS
  Filled 2024-06-27: qty 1

## 2024-06-27 MED ORDER — ONDANSETRON HCL 4 MG/2ML IJ SOLN
4.0000 mg | INTRAMUSCULAR | Status: AC
  Administered 2024-06-27: 4 mg via INTRAVENOUS
  Filled 2024-06-27: qty 2

## 2024-06-27 MED ORDER — MORPHINE SULFATE (PF) 4 MG/ML IV SOLN
4.0000 mg | Freq: Once | INTRAVENOUS | Status: AC
  Administered 2024-06-27: 4 mg via INTRAVENOUS
  Filled 2024-06-27: qty 1

## 2024-06-27 NOTE — ED Triage Notes (Signed)
 Arrives EMS from home with shortness of breath and nontraumatic  left ankle pain.   Baseline 6L through tracheostomy. Says she had a subjective mucous plug but that resolved prior to arrival.   Scheduled for trach change this week at [facility].

## 2024-06-28 ENCOUNTER — Emergency Department

## 2024-06-28 DIAGNOSIS — Z79891 Long term (current) use of opiate analgesic: Secondary | ICD-10-CM | POA: Diagnosis not present

## 2024-06-28 DIAGNOSIS — Z5320 Procedure and treatment not carried out because of patient's decision for unspecified reasons: Secondary | ICD-10-CM | POA: Diagnosis not present

## 2024-06-28 DIAGNOSIS — Z79899 Other long term (current) drug therapy: Secondary | ICD-10-CM | POA: Diagnosis not present

## 2024-06-28 DIAGNOSIS — I130 Hypertensive heart and chronic kidney disease with heart failure and stage 1 through stage 4 chronic kidney disease, or unspecified chronic kidney disease: Secondary | ICD-10-CM | POA: Diagnosis present

## 2024-06-28 DIAGNOSIS — J9611 Chronic respiratory failure with hypoxia: Secondary | ICD-10-CM | POA: Diagnosis present

## 2024-06-28 DIAGNOSIS — K219 Gastro-esophageal reflux disease without esophagitis: Secondary | ICD-10-CM | POA: Diagnosis present

## 2024-06-28 DIAGNOSIS — D631 Anemia in chronic kidney disease: Secondary | ICD-10-CM | POA: Diagnosis present

## 2024-06-28 DIAGNOSIS — L03116 Cellulitis of left lower limb: Secondary | ICD-10-CM | POA: Diagnosis present

## 2024-06-28 DIAGNOSIS — Z6841 Body Mass Index (BMI) 40.0 and over, adult: Secondary | ICD-10-CM | POA: Diagnosis not present

## 2024-06-28 DIAGNOSIS — N1831 Chronic kidney disease, stage 3a: Secondary | ICD-10-CM | POA: Diagnosis present

## 2024-06-28 DIAGNOSIS — Z9911 Dependence on respirator [ventilator] status: Secondary | ICD-10-CM | POA: Diagnosis not present

## 2024-06-28 DIAGNOSIS — I5032 Chronic diastolic (congestive) heart failure: Secondary | ICD-10-CM | POA: Diagnosis present

## 2024-06-28 DIAGNOSIS — J45909 Unspecified asthma, uncomplicated: Secondary | ICD-10-CM | POA: Diagnosis present

## 2024-06-28 DIAGNOSIS — Z930 Tracheostomy status: Secondary | ICD-10-CM | POA: Diagnosis not present

## 2024-06-28 DIAGNOSIS — F319 Bipolar disorder, unspecified: Secondary | ICD-10-CM | POA: Diagnosis present

## 2024-06-28 DIAGNOSIS — Z87891 Personal history of nicotine dependence: Secondary | ICD-10-CM | POA: Diagnosis not present

## 2024-06-28 DIAGNOSIS — G894 Chronic pain syndrome: Secondary | ICD-10-CM | POA: Diagnosis present

## 2024-06-28 DIAGNOSIS — E662 Morbid (severe) obesity with alveolar hypoventilation: Secondary | ICD-10-CM | POA: Diagnosis present

## 2024-06-28 LAB — BRAIN NATRIURETIC PEPTIDE: B Natriuretic Peptide: 15.1 pg/mL (ref 0.0–100.0)

## 2024-06-28 LAB — PROTIME-INR
INR: 1.1 (ref 0.8–1.2)
Prothrombin Time: 14.3 s (ref 11.4–15.2)

## 2024-06-28 LAB — LACTIC ACID, PLASMA: Lactic Acid, Venous: 0.8 mmol/L (ref 0.5–1.9)

## 2024-06-28 MED ORDER — ACETAMINOPHEN 325 MG PO TABS
650.0000 mg | ORAL_TABLET | Freq: Four times a day (QID) | ORAL | Status: DC | PRN
  Administered 2024-06-29 – 2024-06-30 (×3): 650 mg via ORAL
  Filled 2024-06-28 (×3): qty 2

## 2024-06-28 MED ORDER — MORPHINE SULFATE (PF) 4 MG/ML IV SOLN
4.0000 mg | Freq: Once | INTRAVENOUS | Status: AC
  Administered 2024-06-28: 4 mg via INTRAVENOUS
  Filled 2024-06-28: qty 1

## 2024-06-28 MED ORDER — ACETAMINOPHEN 650 MG RE SUPP: 650.0000 mg | Freq: Four times a day (QID) | RECTAL | Status: DC | PRN

## 2024-06-28 MED ORDER — ENOXAPARIN SODIUM 100 MG/ML IJ SOSY
0.5000 mg/kg | PREFILLED_SYRINGE | INTRAMUSCULAR | Status: DC
  Administered 2024-06-28: 87.5 mg via SUBCUTANEOUS
  Filled 2024-06-28 (×4): qty 1

## 2024-06-28 MED ORDER — ONDANSETRON HCL 4 MG PO TABS: 4.0000 mg | ORAL_TABLET | Freq: Four times a day (QID) | ORAL | Status: DC | PRN

## 2024-06-28 MED ORDER — ALBUTEROL SULFATE (2.5 MG/3ML) 0.083% IN NEBU: 2.5000 mg | INHALATION_SOLUTION | RESPIRATORY_TRACT | Status: DC | PRN

## 2024-06-28 MED ORDER — ACETAMINOPHEN 325 MG PO TABS: 650.0000 mg | ORAL_TABLET | Freq: Four times a day (QID) | ORAL | Status: DC | PRN

## 2024-06-28 MED ORDER — ALUM & MAG HYDROXIDE-SIMETH 200-200-20 MG/5ML PO SUSP: 30.0000 mL | ORAL | Status: DC | PRN

## 2024-06-28 MED ORDER — SODIUM CHLORIDE 0.9 % IV SOLN
2.0000 g | INTRAVENOUS | Status: DC
  Administered 2024-06-29 – 2024-06-30 (×2): 2 g via INTRAVENOUS
  Filled 2024-06-28 (×2): qty 20

## 2024-06-28 MED ORDER — GUAIFENESIN ER 600 MG PO TB12
600.0000 mg | ORAL_TABLET | Freq: Two times a day (BID) | ORAL | Status: DC
  Administered 2024-06-28 – 2024-06-30 (×5): 600 mg via ORAL
  Filled 2024-06-28 (×5): qty 1

## 2024-06-28 MED ORDER — ONDANSETRON HCL 4 MG/2ML IJ SOLN: 4.0000 mg | Freq: Four times a day (QID) | INTRAMUSCULAR | Status: DC | PRN

## 2024-06-28 MED ORDER — SODIUM CHLORIDE 0.9 % IV SOLN
2.0000 g | Freq: Once | INTRAVENOUS | Status: AC
  Administered 2024-06-28: 2 g via INTRAVENOUS
  Filled 2024-06-28: qty 20

## 2024-06-28 MED ORDER — OXYCODONE HCL ER 15 MG PO T12A
15.0000 mg | EXTENDED_RELEASE_TABLET | Freq: Two times a day (BID) | ORAL | Status: DC
  Administered 2024-06-28 – 2024-06-30 (×5): 15 mg via ORAL
  Filled 2024-06-28 (×5): qty 1

## 2024-06-28 MED ORDER — SODIUM CHLORIDE 0.9 % IV SOLN: 1.0000 g | INTRAVENOUS | Status: DC

## 2024-06-28 MED ORDER — OXYCODONE HCL 5 MG PO TABS
15.0000 mg | ORAL_TABLET | Freq: Four times a day (QID) | ORAL | Status: DC | PRN
  Administered 2024-06-28 – 2024-06-30 (×8): 15 mg via ORAL
  Filled 2024-06-28 (×8): qty 3

## 2024-06-28 NOTE — Assessment & Plan Note (Signed)
 At baseline

## 2024-06-28 NOTE — ED Notes (Addendum)
 Assumed care of patient.

## 2024-06-28 NOTE — Assessment & Plan Note (Signed)
 Complicating factor to overall prognosis and care

## 2024-06-28 NOTE — ED Notes (Signed)
 This RN unable to obtain second blood culture from patient, lab draw requested.

## 2024-06-28 NOTE — Assessment & Plan Note (Signed)
 Continue Rocephin  Pain control Elevate leg Venous ultrasound negative for DVT

## 2024-06-28 NOTE — H&P (Signed)
 History and Physical    Patient: Sandra Holloway FMW:[id number] DOB: Oct 30, 1976 DOA: 06/27/2024 DOS: the patient was seen and examined on 06/28/2024 PCP: Levander Lucienne PARAS, MD  Patient coming from: Home  Chief Complaint:  Chief Complaint  Patient presents with   Shortness of Breath    HPI: Sandra Holloway is a 48 y.o. female with medical history significant for obesity hypoventilation syndrome  with tracheostomy on 6 L oxygen and nighttime ventilator dependence, CKD stage III, hypertension, prediabetes, bipolar disorder, recurrent cellulitis left foot, last hospitalized for it year ago when she was septic, being admitted with another episode of cellulitis of the left foot as well as shortness of breath. Patient reports that her left foot has been swelling for the past 5 days is not very painful and she is unable to bear weight on the foot which feels hot and swollen. Regarding her shortness of breath, she coughed up some mucus and felt improved by the time of arrival.  States she is scheduled for quarterly trach change this week at [facility] In the ED, tachypneic to 26 with otherwise normal vitals. Labs notable for mild anemia of 9.5 which is her baseline.  BNP normal at 15.  Other labs at baseline. Venous ultrasound negative for DVT. Left foot x-ray showing dorsal swelling Chest x-ray showing cardiomegaly with vascular congestion. Patient started on Rocephin . Admission requested for cellulitis left foot     Review of Systems: As mentioned in the history of present illness. All other systems reviewed and are negative.  Past Medical History:  Diagnosis Date   Dyspnea    Hypertension    Past Surgical History:  Procedure Laterality Date   TRACHEOSTOMY     Social History:  reports that she has quit smoking. Her smoking use included cigarettes. She has never used smokeless tobacco. She reports that she does not currently use alcohol. She reports that she does not currently use drugs.  No  Known Allergies  History reviewed. No pertinent family history.  Prior to Admission medications   Medication Sig Start Date End Date Taking? Authorizing Provider  acetaminophen  (TYLENOL ) 325 MG tablet Take 2 tablets (650 mg total) by mouth every 6 (six) hours as needed for mild pain (or Fever >/= 101). 03/30/22   Jens Durand, MD  cyclobenzaprine  (FLEXERIL ) 5 MG tablet Take 5 mg by mouth at bedtime.    [provider]  furosemide  (LASIX ) 40 MG tablet Take 40 mg by mouth daily.    [provider]  loratadine  (CLARITIN ) 10 MG tablet Take 10 mg by mouth daily.    [provider]  omeprazole (PRILOSEC) 20 MG capsule Take 20 mg by mouth daily.    [provider]  oxyCODONE  (ROXICODONE ) 15 MG immediate release tablet Take 15 mg by mouth every 6 (six) hours. 02/09/24   [provider]  OXYCONTIN  15 MG 12 hr tablet Take 15 mg by mouth every 12 (twelve) hours. 02/06/24   [provider]  pregabalin  (LYRICA ) 25 MG capsule Take 25 mg by mouth 2 (two) times daily. 06/15/23   [provider]  sertraline  (ZOLOFT ) 100 MG tablet Take 100 mg by mouth daily.    [provider]  WEGOVY 1.7 MG/0.75ML SOAJ Inject 1.7 mg into the skin every 7 (seven) days. 02/02/24   [provider]    Physical Exam: Vitals:   06/27/24 2337 06/28/24 0000 06/28/24 0330 06/28/24 0357  BP: 135/71 135/65 136/71   Pulse: 82 77 73   Resp: ROLLEN)  26 (!) 24 18   Temp: 98.3 F (36.8 C)   98.1 F (36.7 C)  TempSrc:    Oral  SpO2: 100% 100% 99%   Weight: (!) 174.6 kg     Height: 5' 2 (1.575 m)      Physical Exam Vitals and nursing note reviewed.  Constitutional:      General: She is not in acute distress.    Comments: Congested cough.  No distress  HENT:     Head: Normocephalic and atraumatic.  Cardiovascular:     Rate and Rhythm: Normal rate and regular rhythm.     Heart sounds: Normal heart sounds.  Pulmonary:     Effort: Tachypnea present.      Breath sounds: Normal breath sounds.  Abdominal:     Palpations: Abdomen is soft.     Tenderness: There is no abdominal tenderness.  Musculoskeletal:     Comments: Left foot to mid shin with mild erythema and warmth  Neurological:     Mental Status: Mental status is at baseline.     Labs on Admission: I have personally reviewed following labs and imaging studies  CBC: Recent Labs  Lab 06/27/24 2341  WBC 9.7  NEUTROABS 6.4  HGB 9.5*  HCT 30.6*  MCV 82.7  PLT 279   Basic Metabolic Panel: Recent Labs  Lab 06/27/24 2341  NA 138  K 4.2  CL 102  CO2 25  GLUCOSE 94  BUN 19  CREATININE 1.24*  CALCIUM 9.0   GFR: Estimated Creatinine Clearance: 87.5 mL/min (A) (by C-G formula based on SCr of 1.24 mg/dL (H)). Liver Function Tests: No results for input(s): AST, ALT, ALKPHOS, BILITOT, PROT, ALBUMIN in the last 168 hours. No results for input(s): LIPASE, AMYLASE in the last 168 hours. No results for input(s): AMMONIA in the last 168 hours. Coagulation Profile: Recent Labs  Lab 06/28/24 0047  INR 1.1   Cardiac Enzymes: No results for input(s): CKTOTAL, CKMB, CKMBINDEX, TROPONINI in the last 168 hours. BNP (last 3 results) No results for input(s): PROBNP in the last 8760 hours. HbA1C: No results for input(s): HGBA1C in the last 72 hours. CBG: No results for input(s): GLUCAP in the last 168 hours. Lipid Profile: No results for input(s): CHOL, HDL, LDLCALC, TRIG, CHOLHDL, LDLDIRECT in the last 72 hours. Thyroid Function Tests: No results for input(s): TSH, T4TOTAL, FREET4, T3FREE, THYROIDAB in the last 72 hours. Anemia Panel: No results for input(s): VITAMINB12, FOLATE, FERRITIN, TIBC, IRON, RETICCTPCT in the last 72 hours. Urine analysis:    Component Value Date/Time   COLORURINE YELLOW (A) 03/06/2024 0429   APPEARANCEUR HAZY (A) 03/06/2024 0429   LABSPEC 1.024 03/06/2024 0429   PHURINE 5.0  03/06/2024 0429   GLUCOSEU NEGATIVE 03/06/2024 0429   HGBUR SMALL (A) 03/06/2024 0429   BILIRUBINUR NEGATIVE 03/06/2024 0429   KETONESUR NEGATIVE 03/06/2024 0429   PROTEINUR 30 (A) 03/06/2024 0429   NITRITE NEGATIVE 03/06/2024 0429   LEUKOCYTESUR NEGATIVE 03/06/2024 0429    Radiological Exams on Admission: US  Venous Img Lower Unilateral Left Result Date: 06/28/2024 CLINICAL DATA:  Left lower extremity pain and swelling EXAM: LEFT LOWER EXTREMITY VENOUS DOPPLER ULTRASOUND TECHNIQUE: Gray-scale sonography with graded compression, as well as color Doppler and duplex ultrasound were performed to evaluate the lower extremity deep venous systems from the level of the common femoral vein and including the common femoral, femoral, profunda femoral, popliteal and calf veins including the posterior tibial, peroneal and gastrocnemius veins when visible. The superficial great saphenous vein  was also interrogated. Spectral Doppler was utilized to evaluate flow at rest and with distal augmentation maneuvers in the common femoral, femoral and popliteal veins. COMPARISON:  None Available. FINDINGS: Contralateral Common Femoral Vein: Respiratory phasicity is normal and symmetric with the symptomatic side. No evidence of thrombus. Normal compressibility. Common Femoral Vein: No evidence of thrombus. Normal compressibility, respiratory phasicity and response to augmentation. Saphenofemoral Junction: No evidence of thrombus. Normal compressibility and flow on color Doppler imaging. Profunda Femoral Vein: No evidence of thrombus. Normal compressibility and flow on color Doppler imaging. Femoral Vein: No evidence of thrombus. Normal compressibility, respiratory phasicity and response to augmentation. Popliteal Vein: No evidence of thrombus. Normal compressibility, respiratory phasicity and response to augmentation. Calf Veins: No evidence of thrombus. Normal compressibility and flow on color Doppler imaging. Superficial Great  Saphenous Vein: No evidence of thrombus. Normal compressibility. Venous Reflux:  None. Other Findings:  None. IMPRESSION: No evidence of deep venous thrombosis. Electronically Signed   By: Oneil Devonshire M.D.   On: 06/28/2024 01:36   DG Foot Complete Left Result Date: 06/28/2024 CLINICAL DATA:  Foot pain, no known injury. EXAM: LEFT FOOT - COMPLETE 3+ VIEW COMPARISON:  None Available. 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 No acute bony abnormality. Specifically, no fracture, subluxation, or dislocation. Plantar calcaneal spur. Marked soft tissue swelling along the dorsum of the foot. IMPRESSION: Dorsal soft tissue swelling.  No acute bony abnormality. Electronically Signed   By: Franky Crease M.D.   On: 06/28/2024 00:17   DG Chest Portable 1 View Result Date: 06/28/2024 CLINICAL DATA:  Shortness of breath EXAM: PORTABLE CHEST 1 VIEW COMPARISON:  06/12/2024 FINDINGS: Cardiomegaly, vascular congestion. No overt edema. No confluent opacities or effusions. No acute bony abnormality. IMPRESSION: Cardiomegaly, vascular congestion Electronically Signed   By: Franky Crease M.D.   On: 06/28/2024 00:16   Data Reviewed for HPI: Relevant notes from primary care and specialist visits, past discharge summaries as available in EHR, including Care Everywhere. 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 Observation status is judged to be reasonable and necessary in order to provide the required intensity of service to ensure the patient's safety. The patient's presenting symptoms, physical exam findings, and initial radiographic and laboratory data in the context of their medical condition is felt to place them at decreased risk for further clinical deterioration. Furthermore, it is anticipated that the patient will be medically stable for discharge from the hospital within 2 midnights of admission.   Author: Delayne LULLA Solian, MD 06/28/2024 4:46 AM  For on call review [email or web address].

## 2024-06-28 NOTE — ED Notes (Signed)
 This NT and NT [place] rounded on Pt. Fall bundle in place and Pt denies further needs at this time.

## 2024-06-28 NOTE — Progress Notes (Signed)
 RT to patient bedside to set up trach humidification and provide trach care. Patient placed on 8L 30% Trach collar, inner cannular changed at this time. Patient is known to Respiratory, wears home ventilator at night, when asked about her home vent patient stated, I wear my vent at home but I'm not bringing it in. RN made aware of patients nightly vent requirements and patients refusal to bring in home ventilator.

## 2024-06-28 NOTE — Progress Notes (Signed)
 Anticoagulation monitoring(Lovenox ):  48 yo female ordered Lovenox  40 mg Q24h    Filed Weights   06/27/24 2337  Weight: (!) 174.6 kg (385 lb)   BMI 70.4   Lab Results  Component Value Date   CREATININE 1.24 (H) 06/27/2024   CREATININE 1.53 (H) 06/12/2024   CREATININE 1.34 (H) 03/09/2024   Estimated Creatinine Clearance: 87.5 mL/min (A) (by C-G formula based on SCr of 1.24 mg/dL (H)). Hemoglobin & Hematocrit     Component Value Date/Time   HGB 9.5 (L) 06/27/2024 2341   HCT 30.6 (L) 06/27/2024 2341     Per Protocol for Patient with estCrcl > 30 ml/min and BMI > 30, will transition to Lovenox  87.5 mg Q24h.

## 2024-06-28 NOTE — Assessment & Plan Note (Addendum)
 Chronic respiratory failure with hypoxia Continue daytime oxygen with nighttime ventilator

## 2024-06-28 NOTE — Progress Notes (Signed)
 Interval events noted.  She complains of pain in the left foot.  No shortness of breath or chest pain.  Vital signs are stable.   Sandra Holloway is a 48 y.o. female with medical history significant for obesity hypoventilation syndrome  with tracheostomy on 6 L oxygen and nighttime ventilator dependence, CKD stage III, hypertension, prediabetes, bipolar disorder, recurrent cellulitis left foot (hospitalized with sepsis from left foot cellulitis about a year ago).  She presented to the hospital because of shortness of breath and painful swelling in the left foot.  Pain in the left foot has been going on for about 5 days and she has been unable to bear weight on the left foot.  She was admitted to the hospital for left foot cellulitis. Regarding shortness of breath, she coughed up some mucus and felt better by the time she got to the hospital.  It is presumed that patient had a mucous plug that caused her shortness of breath.  She is scheduled for quarterly trach change this week at [facility].  Continue IV ceftriaxone  for left foot cellulitis.  Analgesics as needed for pain. Patient uses a ventilator at night but she did not bring it to the hospital.  She will use BiPAP at night.  Continue 6 L/min oxygen via tracheostomy.

## 2024-06-28 NOTE — ED Notes (Signed)
 This tech and [employer] and [place] EDT assisted pt to the toilet. Pt urinated in toilet. Full linen change was made. Pt was assisted back into bed. Call bell in reach.

## 2024-06-28 NOTE — ED Provider Notes (Signed)
 [facility] Provider Note    Event Date/Time   First MD Initiated Contact with Patient 06/27/24 2324     (approximate)   History   Shortness of Breath   HPI Sandra Holloway is a 48 y.o. female with extensive chronic medical issues including super morbid obesity, chronic respiratory failure with trach, CHF, obesity hypoventilation syndrome, chronic pain.  She presents by EMS tonight for shortness of breath as well as ankle pain and foot swelling on the left side.  She is on 6 L of oxygen through her tracheostomy at baseline.  She said that she started feeling short of breath and feels like she got choked on a mucous plug, but she was able to cough it up prior to EMS arriving and she feels better now from a respiratory standpoint.  She got scared because she felt she could not breathe or clear the tube but now she feels better.  She is due for a 90-day trach change later this week at [facility].  The other concern is her left foot and ankle.  She said it has been swelling over the last 5 days or so and has become very painful.  She is typically ambulatory but she cannot bear weight on that foot.  She said that she feels like she is putting on fluid and the foot is hot and red and swollen.  She has no history of gout.     Physical Exam   Triage Vital Signs: ED Triage Vitals [06/27/24 2337]  Encounter Vitals Group     BP 135/71     Girls Systolic BP Percentile      Girls Diastolic BP Percentile      Boys Systolic BP Percentile      Boys Diastolic BP Percentile      Pulse Rate 82     Resp (!) 26     Temp 98.3 F (36.8 C)     Temp src      SpO2 100 %     Weight (!) 174.6 kg (385 lb)     Height 1.575 m (5' 2)     Head Circumference      Peak Flow      Pain Score 6     Pain Loc      Pain Education      Exclude from Growth Chart     Most recent vital signs: Vitals:   06/28/24 0330 06/28/24 0357  BP: 136/71   Pulse: 73   Resp: 18   Temp:  98.1 F (36.7  C)  SpO2: 99%     General: Awake, alert, conversant though it is difficult with the tracheostomy. CV:  Good peripheral perfusion.  Regular rate and rhythm. Resp:  Normal effort. no accessory muscle usage nor intercostal retractions.  Lungs are clear to auscultation bilaterally.  Some coarse breath sounds via the tracheostomy suggestive of mucus, but the patient is currently asymptomatic. Abd:  Super morbid obesity.  No tenderness to palpation of the abdomen. Other:  Patient has some chronic swelling of bilateral lower extremities but she has obvious increased swelling with erythema of the left foot, particularly on the dorsum of the left foot, as well as the ankle.  It is warm to the touch and appears consistent with cellulitis.   ED Results / Procedures / Treatments   Labs (all labs ordered are listed, but only abnormal results are displayed) Labs Reviewed  CBC WITH DIFFERENTIAL/PLATELET - Abnormal; Notable for the following components:  Result Value   RBC 3.70 (*)    Hemoglobin 9.5 (*)    HCT 30.6 (*)    MCH 25.7 (*)    RDW 16.7 (*)    All other components within normal limits  BASIC METABOLIC PANEL WITH GFR - Abnormal; Notable for the following components:   Creatinine, Ser 1.24 (*)    GFR, Estimated 54 (*)    All other components within normal limits  CULTURE, BLOOD (SINGLE)  CULTURE, BLOOD (SINGLE)  BRAIN NATRIURETIC PEPTIDE  LACTIC ACID, PLASMA  PROTIME-INR     RADIOLOGY I independently viewed and interpreted the patient's chest x-ray and is difficult to appreciate due to body habitus.  She may have some degree of vascular congestion but this may also be baseline.  Radiology suggested vascular congestion as well.  I also viewed and interpreted the patient's left foot x-rays and I can see no evidence of bony injury or osteomyelitis.  Radiologist confirmed dorsal soft tissue swelling but no bony abnormalities.  I also viewed and interpreted the patient's left lower  extremity ultrasound.  I see no evidence of DVT.  Radiology confirmed no acute findings.   PROCEDURES:  Critical Care performed: No  Procedures    IMPRESSION / MDM / ASSESSMENT AND PLAN / ED COURSE  I reviewed the triage vital signs and the nursing notes.                              Differential diagnosis includes, but is not limited to, CHF exacerbation, asthma exacerbation (patient reports asthma), acute on chronic respiratory failure due to obesity hypoventilation syndrome, mucous plug, pneumonia, DVT, cellulitis, gout.  Patient's presentation is most consistent with acute presentation with potential threat to life or bodily function.  Labs/studies ordered: Lactic acid, blood culture, BMP, BNP, pro time-INR, CBC with differential, chest x-ray, foot x-rays, venous ultrasound of left lower extremity.  Interventions/Medications given:  Medications  cefTRIAXone  (ROCEPHIN ) 2 g in sodium chloride  0.9 % 100 mL IVPB (has no administration in time range)  morphine  (PF) 4 MG/ML injection 4 mg (has no administration in time range)  morphine  (PF) 4 MG/ML injection 4 mg (4 mg Intravenous Given 06/27/24 2347)  ondansetron  (ZOFRAN ) injection 4 mg (4 mg Intravenous Given 06/27/24 2343)  ketorolac  (TORADOL ) 30 MG/ML injection 15 mg (15 mg Intravenous Given 06/27/24 2345)    (Note:  hospital course my include additional interventions and/or labs/studies not listed above.)   Patient already felt better after clearing of mucous plug and her vital signs are stable within normal limits other than some tachypnea which is likely baseline based on her body habitus.  She has no leukocytosis, stable hemoglobin, stable creatinine, BNP of only 15, and a normal lactic acid.  X-rays as documented above are reassuring with no evidence of bony injury to her left foot.  I suspect she has a recurrence of cellulitis of the left foot which was documented previously in her chart.  I will discuss the findings with  her but anticipate she may need admission for IV antibiotics given all of her comorbidities and the amount of swelling and pain she is experiencing which required morphine  4 mg IV, Toradol  15 mg IV, and Zofran  4 mg IV.     Clinical Course as of 06/28/24 0409  Thu Jun 28, 2024  0348 I discussed with the patient and she confirmed that she has had left foot/lower leg cellulitis in the past required hospitalization.  Given all of her core morbidities, I am concerned that oral antibiotics will be insufficient and she will develop sepsis.  I ordered ceftriaxone  2 g IV and an additional blood culture.  She does not meet sepsis criteria at this point and I will hold off on aggressive fluid resuscitation since she has no evidence that she requires it.  I am consulting the hospitalist team for admission. [CF]  [phone] Patient requesting additional pain medicine for her foot.  Ordered morphine  4 mg IV (second dose) [CF]  0409 I consulted by phone with the admitting hospitalist, and they will admit the patient - Dr. Cleatus. [CF]    Clinical Course User Index [CF] Gordan Huxley, MD     FINAL CLINICAL IMPRESSION(S) / ED DIAGNOSES   Final diagnoses:  Cellulitis of left foot  Chronic respiratory failure, unspecified whether with hypoxia or hypercapnia (HCC)     Rx / DC Orders   ED Discharge Orders     None        Note:  This document was prepared using Dragon voice recognition software and may include unintentional dictation errors.   Gordan Huxley, MD 06/28/24 [phone]

## 2024-06-28 NOTE — Assessment & Plan Note (Signed)
 Continue home meds

## 2024-06-29 DIAGNOSIS — L03116 Cellulitis of left lower limb: Secondary | ICD-10-CM | POA: Diagnosis not present

## 2024-06-29 MED ORDER — PANTOPRAZOLE SODIUM 40 MG PO TBEC
40.0000 mg | DELAYED_RELEASE_TABLET | Freq: Every day | ORAL | Status: DC
  Administered 2024-06-29 – 2024-06-30 (×2): 40 mg via ORAL
  Filled 2024-06-29 (×2): qty 1

## 2024-06-29 MED ORDER — SERTRALINE HCL 50 MG PO TABS
100.0000 mg | ORAL_TABLET | Freq: Every day | ORAL | Status: DC
  Administered 2024-06-29 – 2024-06-30 (×2): 100 mg via ORAL
  Filled 2024-06-29 (×2): qty 2

## 2024-06-29 MED ORDER — PREGABALIN 25 MG PO CAPS
25.0000 mg | ORAL_CAPSULE | Freq: Two times a day (BID) | ORAL | Status: DC
  Administered 2024-06-29 – 2024-06-30 (×3): 25 mg via ORAL
  Filled 2024-06-29 (×3): qty 1

## 2024-06-29 MED ORDER — ENOXAPARIN SODIUM 100 MG/ML IJ SOSY
85.0000 mg | PREFILLED_SYRINGE | INTRAMUSCULAR | Status: DC
  Administered 2024-06-29: 85 mg via SUBCUTANEOUS
  Filled 2024-06-29: qty 1

## 2024-06-29 MED ORDER — FUROSEMIDE 40 MG PO TABS
40.0000 mg | ORAL_TABLET | Freq: Every day | ORAL | Status: DC
  Administered 2024-06-29 – 2024-06-30 (×2): 40 mg via ORAL
  Filled 2024-06-29 (×2): qty 1

## 2024-06-29 MED ORDER — KETOROLAC TROMETHAMINE 30 MG/ML IJ SOLN
30.0000 mg | Freq: Once | INTRAMUSCULAR | Status: AC | PRN
  Administered 2024-06-29: 30 mg via INTRAVENOUS
  Filled 2024-06-29: qty 1

## 2024-06-29 NOTE — Progress Notes (Signed)
 Per RT, pt is refusing to wear vent tonight and wanted to stay on trach collar. No other concern at the moment. Plan of care continued.

## 2024-06-29 NOTE — Plan of Care (Signed)

## 2024-06-29 NOTE — Progress Notes (Signed)
 Progress Note    Sandra Holloway  FMW:[id number] DOB: [birth year]  DOA: 06/27/2024 PCP: Levander Lucienne PARAS, MD      Brief Narrative:    Medical records reviewed and are as summarized below:  Sandra Holloway is a 48 y.o. female  with medical history significant for obesity hypoventilation syndrome  with tracheostomy on 6 L oxygen and nighttime ventilator dependence, CKD stage III, hypertension, prediabetes, bipolar disorder, recurrent cellulitis left foot (hospitalized with sepsis from left foot cellulitis about a year ago).  She presented to the hospital because of shortness of breath and painful swelling in the left foot.  Pain in the left foot has been going on for about 5 days and she has been unable to bear weight on the left foot.  She was admitted to the hospital for left foot cellulitis. Regarding shortness of breath, she coughed up some mucus and felt better by the time she got to the hospital.  It is presumed that patient had a mucous plug that caused her shortness of breath.  She is scheduled for quarterly trach change this week at [facility].         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  (Morbid obesity)   Left foot cellulitis, recurrent:  Continue IV ceftriaxone .  Analgesics as needed for pain.  Venous duplex of left lower extremity was negative.   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  Use BiPAP every night.  Patient had refused BiPAP overnight    Essential hypertension, chronic diastolic CHF Compensated.  Continue  Lasix     Stage 3a chronic kidney disease (HCC) At baseline   Morbid obesity with BMI of 70 and over, adult [facility]) This complicates overall care and prognosis    Diet Order             Diet Heart Room service appropriate? Yes; Fluid consistency: Thin  Diet effective now                            Consultants: None  Procedures: None    Medications:    enoxaparin  (LOVENOX ) injection  0.5 mg/kg Subcutaneous Q24H   furosemide   40 mg Oral Daily   guaiFENesin   600 mg Oral BID   oxyCODONE   15 mg Oral Q12H   pantoprazole   40 mg Oral Daily   pregabalin   25 mg Oral BID   sertraline   100 mg Oral Daily   Continuous Infusions:  cefTRIAXone  (ROCEPHIN )  IV 200 mL/hr at 06/29/24 0550     Anti-infectives (From admission, onward)    Start     Dose/Rate Route Frequency Ordered Stop   06/29/24 0600  cefTRIAXone  (ROCEPHIN ) 2 g in sodium chloride  0.9 % 100 mL IVPB        2 g 200 mL/hr over 30 Minutes Intravenous Every 24 hours 06/28/24 1434     06/29/24 0400  cefTRIAXone  (ROCEPHIN ) 1 g in sodium chloride  0.9 % 100 mL IVPB  Status:  Discontinued  1 g 200 mL/hr over 30 Minutes Intravenous Every 24 hours 06/28/24 0449 06/28/24 1434   06/28/24 0400  cefTRIAXone  (ROCEPHIN ) 2 g in sodium chloride  0.9 % 100 mL IVPB        2 g 200 mL/hr over 30 Minutes Intravenous  Once 06/28/24 0346 06/28/24 0725              Family Communication/Anticipated D/C date and plan/Code Status   DVT prophylaxis:      Code Status: Full Code  Family Communication: None Disposition Plan: Plan to discharge home   Status is: Inpatient Remains inpatient appropriate because: Left foot cellulitis on IV antibiotics       Subjective:   Interval events noted.  She still has some pain in the left foot but is better compared to yesterday.  No shortness of breath or chest pain.  She did not use BiPAP overnight but she felt okay.  Objective:    Vitals:   06/29/24 0149  06/29/24 0423 06/29/24 0808 06/29/24 1128  BP: (!) 140/83 (!) 123/56 108/62 128/66  Pulse: 69 60 69 63  Resp: 20 20    Temp: 98.3 F (36.8 C) 98 F (36.7 C) 98.3 F (36.8 C) 98.2 F (36.8 C)  TempSrc: Oral Oral    SpO2: 98% 99% 97% 98%  Weight:      Height:       No data found.   Intake/Output Summary (Last 24 hours) at 06/29/2024 1133 Last data filed at 06/29/2024 0900 Gross per 24 hour  Intake 746.49 ml  Output --  Net 746.49 ml   Filed Weights   06/27/24 2337  Weight: (!) 174.6 kg    Exam:  GEN: NAD SKIN: Warm and dry EYES: No pallor or icterus ENT: MMM, + tracheostomy CV: RRR PULM: CTA B ABD: soft, obese, NT, +BS CNS: AAO x 3, non focal EXT: Left foot swelling and tenderness, mainly on the dorsal aspect.        Data Reviewed:   I have personally reviewed following labs and imaging studies:  Labs: Labs show the following:   Basic Metabolic Panel: Recent Labs  Lab 06/27/24 2341  NA 138  K 4.2  CL 102  CO2 25  GLUCOSE 94  BUN 19  CREATININE 1.24*  CALCIUM 9.0   GFR Estimated Creatinine Clearance: 87.5 mL/min (A) (by C-G formula based on SCr of 1.24 mg/dL (H)). Liver Function Tests: No results for input(s): AST, ALT, ALKPHOS, BILITOT, PROT, ALBUMIN in the last 168 hours. No results for input(s): LIPASE, AMYLASE in the last 168 hours. No results for input(s): AMMONIA in the last 168 hours. Coagulation profile Recent Labs  Lab 06/28/24 0047  INR 1.1    CBC: Recent Labs  Lab 06/27/24 2341  WBC 9.7  NEUTROABS 6.4  HGB 9.5*  HCT 30.6*  MCV 82.7  PLT 279   Cardiac Enzymes: No results for input(s): CKTOTAL, CKMB, CKMBINDEX, TROPONINI in the last 168 hours. BNP (last 3 results) No results for input(s): PROBNP in the last 8760 hours. CBG: No results for input(s): GLUCAP in the last 168 hours. D-Dimer: No results for input(s): DDIMER in the last 72 hours. Hgb A1c: No results for input(s): HGBA1C in  the last 72 hours. Lipid Profile: No results for input(s): CHOL, HDL, LDLCALC, TRIG, CHOLHDL, LDLDIRECT in the last 72 hours. Thyroid function studies: No results for input(s): TSH, T4TOTAL, T3FREE, THYROIDAB in the last 72 hours.  Invalid input(s): FREET3 Anemia work up: No results for  input(s): VITAMINB12, FOLATE, FERRITIN, TIBC, IRON, RETICCTPCT in the last 72 hours. Sepsis Labs: Recent Labs  Lab 06/27/24 2341 06/28/24 0047  WBC 9.7  --   LATICACIDVEN  --  0.8    Microbiology Recent Results (from the past 240 hours)  Blood culture (routine single)     Status: None (Preliminary result)   Collection Time: 06/28/24 12:47 AM   Specimen: BLOOD LEFT FOREARM  Result Value Ref Range Status   Specimen Description   Final    BLOOD LEFT FOREARM Performed at [facility], [address] [place], [place] [address]    Special Requests   Final    BOTTLES DRAWN AEROBIC ONLY Blood Culture results may not be optimal due to an inadequate volume of blood received in culture bottles Performed at [facility], [address], [place], [place] [address]    Culture   Final    NO GROWTH 1 DAY Performed at [facility], [address] [place], [place] [address]    Report Status PENDING  Incomplete  Blood culture (single)     Status: None (Preliminary result)   Collection Time: 06/28/24  1:21 PM   Specimen: BLOOD  Result Value Ref Range Status   Specimen Description BLOOD RIGHT ANTECUBITAL  Final   Special Requests   Final    BOTTLES DRAWN AEROBIC ONLY Blood Culture adequate volume   Culture   Final    NO GROWTH < 24 HOURS Performed at [facility], [address] [place], [place] [address]    Report Status PENDING  Incomplete    Procedures and diagnostic studies:  US  Venous Img Lower Unilateral Left Result Date: 06/28/2024 CLINICAL DATA:  Left lower extremity pain and swelling EXAM: LEFT LOWER EXTREMITY VENOUS DOPPLER  ULTRASOUND TECHNIQUE: Gray-scale sonography with graded compression, as well as color Doppler and duplex ultrasound were performed to evaluate the lower extremity deep venous systems from the level of the common femoral vein and including the common femoral, femoral, profunda femoral, popliteal and calf veins including the posterior tibial, peroneal and gastrocnemius veins when visible. The superficial great saphenous vein was also interrogated. Spectral Doppler was utilized to evaluate flow at rest and with distal augmentation maneuvers in the common femoral, femoral and popliteal veins. COMPARISON:  None Available. FINDINGS: Contralateral Common Femoral Vein: Respiratory phasicity is normal and symmetric with the symptomatic side. No evidence of thrombus. Normal compressibility. Common Femoral Vein: No evidence of thrombus. Normal compressibility, respiratory phasicity and response to augmentation. Saphenofemoral Junction: No evidence of thrombus. Normal compressibility and flow on color Doppler imaging. Profunda Femoral Vein: No evidence of thrombus. Normal compressibility and flow on color Doppler imaging. Femoral Vein: No evidence of thrombus. Normal compressibility, respiratory phasicity and response to augmentation. Popliteal Vein: No evidence of thrombus. Normal compressibility, respiratory phasicity and response to augmentation. Calf Veins: No evidence of thrombus. Normal compressibility and flow on color Doppler imaging. Superficial Great Saphenous Vein: No evidence of thrombus. Normal compressibility. Venous Reflux:  None. Other Findings:  None. IMPRESSION: No evidence of deep venous thrombosis. Electronically Signed   By: Oneil Devonshire M.D.   On: 06/28/2024 01:36   DG Foot Complete Left Result Date: 06/28/2024 CLINICAL DATA:  Foot pain, no known injury. EXAM: LEFT FOOT - COMPLETE 3+ VIEW COMPARISON:  None Available. 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 No acute bony abnormality.  Specifically, no fracture, subluxation, or dislocation. Plantar calcaneal spur. Marked soft tissue swelling along the dorsum of  the foot. IMPRESSION: Dorsal soft tissue swelling.  No acute bony abnormality. Electronically Signed   By: Franky Crease M.D.   On: 06/28/2024 00:17   DG Chest Portable 1 View Result Date: 06/28/2024 CLINICAL DATA:  Shortness of breath EXAM: PORTABLE CHEST 1 VIEW COMPARISON:  06/12/2024 FINDINGS: Cardiomegaly, vascular congestion. No overt edema. No confluent opacities or effusions. No acute bony abnormality. IMPRESSION: Cardiomegaly, vascular congestion Electronically Signed   By: Franky Crease M.D.   On: 06/28/2024 00:16               LOS: 1 day   Garlan Drewes  [facility]   Pager on [email or web address]. If 7PM-7AM, please contact night-coverage at [email or web address]     06/29/2024, 11:33 AM

## 2024-06-29 NOTE — Progress Notes (Signed)
 At pt request to wait until 0030 to go on vent pt is now refusing to be placed on vent for tonight. She states her anxiety is high and feels more comfortable being left on TC/30%. Pt is sitting on bedside on the phone comfortable and in no distress. Rt will cont to follow as needed.

## 2024-06-30 DIAGNOSIS — L03116 Cellulitis of left lower limb: Secondary | ICD-10-CM | POA: Diagnosis not present

## 2024-06-30 MED ORDER — CEFADROXIL 500 MG PO CAPS
500.0000 mg | ORAL_CAPSULE | Freq: Two times a day (BID) | ORAL | 0 refills | Status: AC
Start: 2024-06-30 — End: 2024-07-07

## 2024-06-30 MED ORDER — OMEPRAZOLE 20 MG PO CPDR: 20.0000 mg | DELAYED_RELEASE_CAPSULE | Freq: Every day | ORAL | 0 refills | Status: AC

## 2024-06-30 NOTE — Progress Notes (Signed)
 1115: Discharge documents completed and reviewed with patient. Patient verbalized understanding of instructions given. VSS. No distress. PIV removed per protocol without difficulty. Patient's family member in route to pick patient up at the main hospital entrance. Hospital staff will take patient to main hospital entrance, (the circle around), via wheelchair to meet family member.

## 2024-06-30 NOTE — Discharge Summary (Signed)
 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 *  Hospital Course:  Sandra Holloway is a 48 y.o. female  with medical history significant for obesity hypoventilation syndrome  with tracheostomy on 6 L oxygen and nighttime ventilator dependence, CKD stage III, hypertension, prediabetes, bipolar disorder, recurrent cellulitis left foot (hospitalized with sepsis from left foot cellulitis about a year ago).  She presented to the hospital because of shortness of breath and painful swelling in the left foot.  Pain in the left foot has been going on for about 5 days and she has been unable to bear weight on the left foot.  She was admitted to the hospital for left foot cellulitis. Regarding shortness of breath, she coughed up some mucus and felt better by the time she got to the hospital.  It is presumed that patient had a mucous plug that caused her shortness of breath.  She is scheduled for quarterly trach change this week at [facility].     Assessment and Plan:   Left foot cellulitis, recurrent:  Completed 3 days of IV ceftriaxone  in the hospital.  She will be discharged home 7 days of cefadroxil  to complete 10 days of treatment. Venous duplex of left lower extremity was negative.     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  Use BiPAP every night.       Essential hypertension, chronic diastolic CHF Compensated.  Continue Lasix      Stage 3a chronic kidney disease (HCC) At baseline     Morbid obesity with BMI of 70 and over, adult [facility]) This complicates overall care and prognosis     Comorbidities include depression on sertraline , chronic pain on oxycodone  as needed and OxyContin , history of asthma and said she has a nebulizer at home.. She requested a prescription for Prilosec for acid reflux.   Her condition has improved and she is deemed stable for discharge home today.       Consultants: None Procedures performed: None Disposition: Home Diet recommendation:  Discharge Diet Orders (From admission, onward)     Start     Ordered   06/30/24 0000  Diet - low sodium heart healthy        06/30/24 0928           Cardiac diet DISCHARGE MEDICATION: Allergies as of 06/30/2024   No Known Allergies      Medication List     TAKE these medications    acetaminophen  325 MG tablet Commonly known as: TYLENOL  Take 2 tablets (650 mg total) by mouth every 6 (six) hours as needed for mild pain (or Fever >/= 101).   cefadroxil  500 MG capsule Commonly known as: DURICEF Take 1 capsule (500 mg total) by mouth 2 (two) times daily for 7 days.  cyclobenzaprine  5 MG tablet Commonly known as: FLEXERIL  Take 5 mg by mouth at bedtime.   furosemide  40 MG tablet Commonly known as: LASIX  Take 40 mg by mouth daily.   loratadine  10 MG tablet Commonly known as: CLARITIN  Take 10 mg by mouth daily.   omeprazole  20 MG capsule Commonly known as: PRILOSEC Take 1 capsule (20 mg total) by mouth daily.   OxyCONTIN  15 MG 12 hr tablet Generic drug: oxyCODONE  Take 15 mg by mouth every 12 (twelve) hours.   oxyCODONE  15 MG immediate release tablet Commonly known as: ROXICODONE  Take 15 mg by mouth every 6 (six) hours.   pregabalin  25 MG  capsule Commonly known as: LYRICA  Take 25 mg by mouth 2 (two) times daily.   sertraline  100 MG tablet Commonly known as: ZOLOFT  Take 100 mg by mouth daily.   Wegovy 1.7 MG/0.75ML Soaj Generic drug: Semaglutide-Weight Management Inject 1.7 mg into the skin every 7 (seven) days.        Discharge Exam: Filed Weights   06/27/24 2337  Weight: (!) 174.6 kg   GEN: NAD SKIN: Warm and dry EYES: No pallor or icterus ENT: MMM CV: RRR PULM: CTA B ABD: soft, obese with large pannus,, NT, +BS CNS: AAO x 3, non focal EXT: Tenderness in the left foot has improved.  She said the dorsal aspect of the left foot is usually more swollen/bigger at baseline than the right.  No erythema or tenderness.   Condition at discharge: good  The results of significant diagnostics from this hospitalization (including imaging, microbiology, ancillary and laboratory) are listed below for reference.   Imaging Studies: US  Venous Img Lower Unilateral Left Result Date: 06/28/2024 CLINICAL DATA:  Left lower extremity pain and swelling EXAM: LEFT LOWER EXTREMITY VENOUS DOPPLER ULTRASOUND TECHNIQUE: Gray-scale sonography with graded compression, as well as color Doppler and duplex ultrasound were performed to evaluate the lower extremity deep venous systems from the level of the common femoral vein and including the common femoral, femoral, profunda femoral, popliteal and calf veins including the posterior tibial, peroneal and gastrocnemius veins when visible. The superficial great saphenous vein was also interrogated. Spectral Doppler was utilized to evaluate flow at rest and with distal augmentation maneuvers in the common femoral, femoral and popliteal veins. COMPARISON:  None Available. FINDINGS: Contralateral Common Femoral Vein: Respiratory phasicity is normal and symmetric with the symptomatic side. No evidence of thrombus. Normal compressibility. Common Femoral Vein: No evidence of thrombus. Normal  compressibility, respiratory phasicity and response to augmentation. Saphenofemoral Junction: No evidence of thrombus. Normal compressibility and flow on color Doppler imaging. Profunda Femoral Vein: No evidence of thrombus. Normal compressibility and flow on color Doppler imaging. Femoral Vein: No evidence of thrombus. Normal compressibility, respiratory phasicity and response to augmentation. Popliteal Vein: No evidence of thrombus. Normal compressibility, respiratory phasicity and response to augmentation. Calf Veins: No evidence of thrombus. Normal compressibility and flow on color Doppler imaging. Superficial Great Saphenous Vein: No evidence of thrombus. Normal compressibility. Venous Reflux:  None. Other Findings:  None. IMPRESSION: No evidence of deep venous thrombosis. Electronically Signed   By: Oneil Devonshire M.D.   On: 06/28/2024 01:36   DG Foot Complete Left Result Date: 06/28/2024 CLINICAL DATA:  Foot pain, no known injury. EXAM: LEFT FOOT - COMPLETE 3+ VIEW COMPARISON:  None Available. 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 No acute bony abnormality. Specifically, no fracture, subluxation, or dislocation. Plantar calcaneal spur. Marked soft tissue swelling along the dorsum of the foot. IMPRESSION: Dorsal  soft tissue swelling.  No acute bony abnormality. Electronically Signed   By: Franky Crease M.D.   On: 06/28/2024 00:17   DG Chest Portable 1 View Result Date: 06/28/2024 CLINICAL DATA:  Shortness of breath EXAM: PORTABLE CHEST 1 VIEW COMPARISON:  06/12/2024 FINDINGS: Cardiomegaly, vascular congestion. No overt edema. No confluent opacities or effusions. No acute bony abnormality. IMPRESSION: Cardiomegaly, vascular congestion Electronically Signed   By: Franky Crease M.D.   On: 06/28/2024 00:16   DG Chest Portable 1 View Result Date: 06/12/2024 CLINICAL DATA:  Shortness of breath EXAM: PORTABLE CHEST 1 VIEW COMPARISON:  03/06/2024 FINDINGS: Cardiac shadow is enlarged but accentuated  by the portable technique. Mild central vascular congestion is noted but slightly improved from the prior study. No focal infiltrate or effusion is noted. No pneumothorax is seen. No bony abnormality is noted. Tracheostomy tube is noted in satisfactory position. IMPRESSION: Persistent but slightly improved vascular congestion. Electronically Signed   By: Oneil Devonshire M.D.   On: 06/12/2024 01:16    Microbiology: Results for orders placed or performed during the hospital encounter of 06/27/24  Blood culture (routine single)     Status: None (Preliminary result)   Collection Time: 06/28/24 12:47 AM   Specimen: BLOOD LEFT FOREARM  Result Value Ref Range Status   Specimen Description   Final    BLOOD LEFT FOREARM Performed at [facility], [address], [place], [place] [address]    Special Requests   Final    BOTTLES DRAWN AEROBIC ONLY Blood Culture results may not be optimal due to an inadequate volume of blood received in culture bottles Performed at [facility], [address], [place], [place] [address]    Culture   Final    NO GROWTH 2 DAYS Performed at [facility], [address] [place], [place] [address]    Report Status PENDING  Incomplete  Blood culture (single)     Status: None (Preliminary result)   Collection Time: 06/28/24  1:21 PM   Specimen: BLOOD  Result Value Ref Range Status   Specimen Description BLOOD RIGHT ANTECUBITAL  Final   Special Requests   Final    BOTTLES DRAWN AEROBIC ONLY Blood Culture adequate volume   Culture   Final    NO GROWTH 2 DAYS Performed at [facility], [address], [place], [place] [address]    Report Status PENDING  Incomplete    Labs: CBC: Recent Labs  Lab 06/27/24 2341  WBC 9.7  NEUTROABS 6.4  HGB 9.5*  HCT 30.6*  MCV 82.7  PLT 279   Basic Metabolic Panel: Recent Labs  Lab 06/27/24 2341  NA 138  K 4.2  CL 102  CO2 25  GLUCOSE 94  BUN 19  CREATININE 1.24*  CALCIUM 9.0   Liver  Function Tests: No results for input(s): AST, ALT, ALKPHOS, BILITOT, PROT, ALBUMIN in the last 168 hours. CBG: No results for input(s): GLUCAP in the last 168 hours.  Discharge time spent: greater than 30 minutes.  Signed: AIDA CHO, MD [facility] 06/30/2024

## 2024-06-30 NOTE — Plan of Care (Signed)

## 2024-07-03 LAB — CULTURE, BLOOD (SINGLE)
Culture: NO GROWTH
Culture: NO GROWTH
Special Requests: ADEQUATE
